# Patient Record
Sex: Male | Born: 1945
Health system: Southern US, Community
[De-identification: ages and names within clinical notes are randomized; demographics above are authoritative.]

## PROBLEM LIST (undated history)

## (undated) DIAGNOSIS — J449 Chronic obstructive pulmonary disease, unspecified: Secondary | ICD-10-CM

## (undated) DIAGNOSIS — K219 Gastro-esophageal reflux disease without esophagitis: Secondary | ICD-10-CM

## (undated) DIAGNOSIS — J302 Other seasonal allergic rhinitis: Secondary | ICD-10-CM

## (undated) DIAGNOSIS — E785 Hyperlipidemia, unspecified: Secondary | ICD-10-CM

## (undated) DIAGNOSIS — I10 Essential (primary) hypertension: Secondary | ICD-10-CM

## (undated) DIAGNOSIS — B029 Zoster without complications: Secondary | ICD-10-CM

## (undated) DIAGNOSIS — E079 Disorder of thyroid, unspecified: Secondary | ICD-10-CM

## (undated) HISTORY — DX: Zoster without complications: B02.9

## (undated) HISTORY — DX: Hyperlipidemia, unspecified: E78.5

## (undated) HISTORY — DX: Gastro-esophageal reflux disease without esophagitis: K21.9

## (undated) HISTORY — DX: Disorder of thyroid, unspecified: E07.9

## (undated) HISTORY — DX: Essential (primary) hypertension: I10

## (undated) HISTORY — PX: MULTIPLE TOOTH EXTRACTIONS: SHX2053

## (undated) HISTORY — DX: Chronic obstructive pulmonary disease, unspecified: J44.9

## (undated) HISTORY — PX: NO PAST SURGERIES: SHX2092

## (undated) HISTORY — DX: Other seasonal allergic rhinitis: J30.2

---

## 2000-10-08 ENCOUNTER — Ambulatory Visit (HOSPITAL_COMMUNITY): Admission: RE | Admit: 2000-10-08 | Discharge: 2000-10-08 | Payer: Self-pay | Admitting: Gastroenterology

## 2000-10-08 ENCOUNTER — Encounter (INDEPENDENT_AMBULATORY_CARE_PROVIDER_SITE_OTHER): Payer: Self-pay | Admitting: Specialist

## 2003-03-22 ENCOUNTER — Encounter (INDEPENDENT_AMBULATORY_CARE_PROVIDER_SITE_OTHER): Payer: Self-pay | Admitting: *Deleted

## 2003-03-22 ENCOUNTER — Encounter: Payer: Self-pay | Admitting: Internal Medicine

## 2004-12-13 ENCOUNTER — Ambulatory Visit: Payer: Self-pay | Admitting: Internal Medicine

## 2005-01-11 ENCOUNTER — Encounter: Payer: Self-pay | Admitting: Internal Medicine

## 2005-04-10 ENCOUNTER — Ambulatory Visit: Payer: Self-pay | Admitting: Family Medicine

## 2005-06-04 ENCOUNTER — Ambulatory Visit: Payer: Self-pay | Admitting: Internal Medicine

## 2006-01-14 ENCOUNTER — Ambulatory Visit: Payer: Self-pay | Admitting: Internal Medicine

## 2006-02-05 ENCOUNTER — Ambulatory Visit: Payer: Self-pay | Admitting: Internal Medicine

## 2006-02-05 ENCOUNTER — Encounter: Payer: Self-pay | Admitting: Internal Medicine

## 2006-05-22 ENCOUNTER — Ambulatory Visit: Payer: Self-pay | Admitting: Internal Medicine

## 2006-09-22 ENCOUNTER — Ambulatory Visit: Payer: Self-pay | Admitting: Internal Medicine

## 2006-11-12 ENCOUNTER — Ambulatory Visit: Payer: Self-pay | Admitting: Internal Medicine

## 2006-11-12 DIAGNOSIS — B029 Zoster without complications: Secondary | ICD-10-CM | POA: Insufficient documentation

## 2006-11-24 ENCOUNTER — Telehealth (INDEPENDENT_AMBULATORY_CARE_PROVIDER_SITE_OTHER): Payer: Self-pay | Admitting: *Deleted

## 2006-11-25 ENCOUNTER — Ambulatory Visit: Payer: Self-pay | Admitting: Internal Medicine

## 2007-03-23 ENCOUNTER — Encounter (INDEPENDENT_AMBULATORY_CARE_PROVIDER_SITE_OTHER): Payer: Self-pay | Admitting: *Deleted

## 2007-04-15 ENCOUNTER — Ambulatory Visit: Payer: Self-pay | Admitting: Internal Medicine

## 2007-04-15 DIAGNOSIS — J449 Chronic obstructive pulmonary disease, unspecified: Secondary | ICD-10-CM

## 2007-04-15 DIAGNOSIS — I10 Essential (primary) hypertension: Secondary | ICD-10-CM | POA: Insufficient documentation

## 2007-04-15 DIAGNOSIS — R498 Other voice and resonance disorders: Secondary | ICD-10-CM | POA: Insufficient documentation

## 2007-04-20 LAB — CONVERTED CEMR LAB
AST: 25 units/L (ref 0–37)
Basophils Absolute: 0 10*3/uL (ref 0.0–0.1)
Basophils Relative: 0 % (ref 0.0–1.0)
Bilirubin, Direct: 0.1 mg/dL (ref 0.0–0.3)
Calcium: 10 mg/dL (ref 8.4–10.5)
Chloride: 103 meq/L (ref 96–112)
Cholesterol: 259 mg/dL (ref 0–200)
GFR calc Af Amer: 97 mL/min
HDL: 47 mg/dL (ref >39.0)
Hemoglobin: 15 g/dL (ref 13.0–17.0)
Iron: 149 ug/dL (ref 42–165)
MCHC: 35.2 g/dL (ref 30.0–36.0)
MCV: 94 fL (ref 78.0–100.0)
Monocytes Absolute: 0.5 10*3/uL (ref 0.2–0.7)
Monocytes Relative: 7.1 % (ref 3.0–11.0)
PSA: 0.57 ng/mL (ref 0.10–4.00)
Potassium: 5 meq/L (ref 3.5–5.1)
TSH: 5.55 microintl units/mL — ABNORMAL HIGH (ref 0.35–5.50)
Total Bilirubin: 0.9 mg/dL (ref 0.3–1.2)
Total Protein: 7.7 g/dL (ref 6.0–8.3)
Triglycerides: 158 mg/dL — ABNORMAL HIGH (ref 0–149)
VLDL: 32 mg/dL (ref 0–40)

## 2007-10-06 ENCOUNTER — Telehealth (INDEPENDENT_AMBULATORY_CARE_PROVIDER_SITE_OTHER): Payer: Self-pay | Admitting: *Deleted

## 2008-03-23 ENCOUNTER — Ambulatory Visit: Payer: Self-pay | Admitting: Internal Medicine

## 2008-03-23 DIAGNOSIS — E785 Hyperlipidemia, unspecified: Secondary | ICD-10-CM

## 2008-08-01 ENCOUNTER — Ambulatory Visit: Payer: Self-pay | Admitting: Internal Medicine

## 2008-08-03 ENCOUNTER — Telehealth: Payer: Self-pay | Admitting: Internal Medicine

## 2008-08-03 LAB — CONVERTED CEMR LAB
ALT: 20 units/L (ref 0–53)
AST: 22 units/L (ref 0–37)
BUN: 13 mg/dL (ref 6–23)
Basophils Absolute: 0.1 10*3/uL (ref 0.0–0.1)
Basophils Relative: 1.3 % (ref 0.0–3.0)
CO2: 30 meq/L (ref 19–32)
Calcium: 9.3 mg/dL (ref 8.4–10.5)
Chloride: 101 meq/L (ref 96–112)
Cholesterol: 258 mg/dL — ABNORMAL HIGH (ref 0–200)
Creatinine, Ser: 0.8 mg/dL (ref 0.4–1.5)
Direct LDL: 162.2 mg/dL
Eosinophils Absolute: 0.3 10*3/uL (ref 0.0–0.7)
Eosinophils Relative: 4.8 % (ref 0.0–5.0)
GFR calc non Af Amer: 103.19 mL/min (ref >60)
Glucose, Bld: 82 mg/dL (ref 70–99)
HCT: 42.3 % (ref 39.0–52.0)
HDL: 46.3 mg/dL (ref >39.00)
Hemoglobin: 14.5 g/dL (ref 13.0–17.0)
Lymphocytes Relative: 32.7 % (ref 12.0–46.0)
Lymphs Abs: 2.1 10*3/uL (ref 0.7–4.0)
MCHC: 34.4 g/dL (ref 30.0–36.0)
MCV: 96.3 fL (ref 78.0–100.0)
Monocytes Absolute: 0.6 10*3/uL (ref 0.1–1.0)
Monocytes Relative: 8.7 % (ref 3.0–12.0)
Neutro Abs: 3.4 10*3/uL (ref 1.4–7.7)
Neutrophils Relative %: 52.5 % (ref 43.0–77.0)
PSA: 0.51 ng/mL (ref 0.10–4.00)
Platelets: 313 10*3/uL (ref 150.0–400.0)
Potassium: 4.3 meq/L (ref 3.5–5.1)
RBC: 4.39 M/uL (ref 4.22–5.81)
RDW: 12.2 % (ref 11.5–14.6)
Sodium: 140 meq/L (ref 135–145)
TSH: 3.7 microintl units/mL (ref 0.35–5.50)
Total CHOL/HDL Ratio: 6
Triglycerides: 208 mg/dL — ABNORMAL HIGH (ref 0.0–149.0)
VLDL: 41.6 mg/dL — ABNORMAL HIGH (ref 0.0–40.0)
WBC: 6.5 10*3/uL (ref 4.5–10.5)

## 2008-08-05 ENCOUNTER — Ambulatory Visit: Payer: Self-pay | Admitting: Internal Medicine

## 2008-10-21 ENCOUNTER — Telehealth: Payer: Self-pay | Admitting: Internal Medicine

## 2008-10-31 ENCOUNTER — Ambulatory Visit: Payer: Self-pay | Admitting: Gastroenterology

## 2008-11-02 ENCOUNTER — Ambulatory Visit: Payer: Self-pay | Admitting: Internal Medicine

## 2008-11-07 LAB — CONVERTED CEMR LAB
AST: 32 units/L (ref 0–37)
Cholesterol: 151 mg/dL (ref 0–200)
HDL: 45.7 mg/dL (ref >39.00)
Total CHOL/HDL Ratio: 3
Triglycerides: 117 mg/dL (ref 0.0–149.0)

## 2008-11-16 ENCOUNTER — Ambulatory Visit: Payer: Self-pay | Admitting: Gastroenterology

## 2008-11-16 HISTORY — PX: COLONOSCOPY: SHX174

## 2009-04-18 ENCOUNTER — Encounter (INDEPENDENT_AMBULATORY_CARE_PROVIDER_SITE_OTHER): Payer: Self-pay | Admitting: *Deleted

## 2009-05-24 ENCOUNTER — Telehealth (INDEPENDENT_AMBULATORY_CARE_PROVIDER_SITE_OTHER): Payer: Self-pay | Admitting: *Deleted

## 2009-07-16 ENCOUNTER — Emergency Department (HOSPITAL_COMMUNITY): Admission: EM | Admit: 2009-07-16 | Discharge: 2009-07-16 | Payer: Self-pay | Admitting: Emergency Medicine

## 2009-07-17 ENCOUNTER — Telehealth (INDEPENDENT_AMBULATORY_CARE_PROVIDER_SITE_OTHER): Payer: Self-pay | Admitting: *Deleted

## 2009-09-18 ENCOUNTER — Ambulatory Visit: Payer: Self-pay | Admitting: Internal Medicine

## 2009-10-17 ENCOUNTER — Telehealth (INDEPENDENT_AMBULATORY_CARE_PROVIDER_SITE_OTHER): Payer: Self-pay | Admitting: *Deleted

## 2009-11-15 ENCOUNTER — Ambulatory Visit: Payer: Self-pay | Admitting: Internal Medicine

## 2009-11-15 DIAGNOSIS — R209 Unspecified disturbances of skin sensation: Secondary | ICD-10-CM | POA: Insufficient documentation

## 2009-11-15 LAB — CONVERTED CEMR LAB
ALT: 25 units/L (ref 0–53)
AST: 23 units/L (ref 0–37)
Basophils Absolute: 0 10*3/uL (ref 0.0–0.1)
Basophils Relative: 0.4 % (ref 0.0–3.0)
Eosinophils Relative: 6 % — ABNORMAL HIGH (ref 0.0–5.0)
Folate: >20 ng/mL
Lymphocytes Relative: 34.8 % (ref 12.0–46.0)
Lymphs Abs: 1.7 10*3/uL (ref 0.7–4.0)
MCV: 95.2 fL (ref 78.0–100.0)
Monocytes Absolute: 0.4 10*3/uL (ref 0.1–1.0)
Monocytes Relative: 8.9 % (ref 3.0–12.0)
Neutro Abs: 2.5 10*3/uL (ref 1.4–7.7)
PSA: 0.01 ng/mL — ABNORMAL LOW (ref 0.10–4.00)
Platelets: 308 10*3/uL (ref 150.0–400.0)
Potassium: 4.7 meq/L (ref 3.5–5.1)
Sed Rate: 20 mm/hr (ref 0–22)
Sodium: 142 meq/L (ref 135–145)
Total CHOL/HDL Ratio: 3
Total Protein: 7.2 g/dL (ref 6.0–8.3)
WBC: 4.9 10*3/uL (ref 4.5–10.5)

## 2009-11-20 ENCOUNTER — Encounter: Payer: Self-pay | Admitting: Internal Medicine

## 2009-11-21 ENCOUNTER — Telehealth: Payer: Self-pay | Admitting: Internal Medicine

## 2009-12-21 ENCOUNTER — Telehealth: Payer: Self-pay | Admitting: Internal Medicine

## 2010-06-12 NOTE — Progress Notes (Signed)
  Phone Note Call from Patient   Caller: Spouse Summary of Call: [facility] that was sent in 04/18/09.  I will re-send rx.  Patient is due ROV.  Will [facility] @ [phone]. Initial call taken by: Shary Decamp,  May 24, 2009 9:58 AM    New/Updated Medications: SIMVASTATIN 40 MG TABS (SIMVASTATIN) 1 by mouth at bedtime - DUE OFFICE VISIT Prescriptions: SIMVASTATIN 40 MG TABS (SIMVASTATIN) 1 by mouth at bedtime - DUE OFFICE VISIT  #90 x 0   Entered by:   Shary Decamp   Authorized by:   Nolon Rod. Paz MD   Signed by:   Shary Decamp on 05/24/2009   Method used:   Printed then faxed to ...       [facility] Fax) (mail-order)             ,          Ph: [phone]       Fax: [phone]   RxID:   [id number] METOPROLOL SUCCINATE 50 MG XR24H-TAB (METOPROLOL SUCCINATE) 1 by mouth once daily -- DUE OFFICE VISIT  #90 x 0   Entered by:   Shary Decamp   Authorized by:   Nolon Rod. Paz MD   Signed by:   Shary Decamp on 05/24/2009   Method used:   Printed then faxed to ...       [facility] Fax) (mail-order)             ,          Ph: [phone]       Fax: [phone]   RxID:   [id number]

## 2010-06-12 NOTE — Procedures (Signed)
Summary: Colonoscopy/[facility]  Colonoscopy/[facility]   Imported By: Lanelle Bal 07/10/2009 12:56:30  _____________________________________________________________________  External Attachment:    Type:   Image     Comment:   External Document

## 2010-06-12 NOTE — Progress Notes (Signed)
  Phone Note Call from Patient Call back at [phone]   Caller: Spouse Summary of Call: Called and wanted to know last tetanus shot, was at ED ladt night due to a fall, cut head.   --Called pt informed last tetanus 08/01/08 .Kandice Hams  July 17, 2009 11:09 AM  Initial call taken by: Kandice Hams,  July 17, 2009 11:10 AM

## 2010-06-12 NOTE — Progress Notes (Signed)
Summary: lab results  Phone Note Outgoing Call   Summary of Call: advise patient: His cholesterol is very well controlled His PSA is good I checked a testosterone level, this is low. Plan to recheck when he comes back. I don't think he has any symptoms from it Good results, continue with same meds Amandine Covino E. Mckay Brandt MD  November 21, 2009 11:02 AM   Follow-up for Phone Call        left message for pt to call back. Army Fossa CMA  November 21, 2009 11:12 AM   Additional Follow-up for Phone Call Additional follow up Details #1::        Patient is aware of lab results. Additional Follow-up by: Harold Barban,  November 21, 2009 1:32 PM

## 2010-06-12 NOTE — Assessment & Plan Note (Signed)
Summary: FOR A SPIDER BITE--PH   Vital Signs:  Patient profile:   65 year old male Height:      69 inches Weight:      171 pounds BMI:     25.34 Temp:     97.6 degrees F oral BP sitting:   122 / 70  Vitals Entered By: Shary Decamp (Sep 18, 2009 2:24 PM) CC: ?spider bite to hands, red, raised bump, itchy, painful   History of Present Illness: patient worked in the yard two days ago, yesterday he had a couple of itchy spots in his hands today he is slightly swollen and tender  Current Medications (verified): 1)  Metoprolol Succinate 50 Mg Xr24h-Tab (Metoprolol Succinate) .Marland Kitchen.. 1 By Mouth Once Daily -- Due Office Visit 2)  Multivitamin 3)  Asa 81mg  .... 1 By Mouth Qd 4)  Calcium 5)  Fish Oil 6)  Garlic 7)  Simvastatin 40 Mg Tabs (Simvastatin) .Marland Kitchen.. 1 By Mouth At Bedtime - Due Office Visit  Allergies (verified): No Known Drug Allergies  Past History:  Past Medical History: Reviewed history from 03/23/2008 and no changes required. COPD, PFTs 01-2006 mild obstruction, (+) reponse to Bronchodilators Hypertension Hyperlipidemia  Past Surgical History: Reviewed history from 04/15/2007 and no changes required. no  Social History: Reviewed history from 04/15/2007 and no changes required. Married 3 kids works at [employer]  Review of Systems       denies fever no  discharge from the area of concern  Physical Exam  General:  alert and well-developed.   Extremities:  at the dorsal aspect of the hands L>R: has a area of redness, no warmness or fluctuancy, no blisters size aprox 2 cm    Impression & Recommendations:  Problem # 1:  ? of CONTACT DERMATITIS (ICD-692.9) contact dermatitis? doubt spider bite Plan Rx steroids, if not better he will let me know, antibiotics? His updated medication list for this problem includes:    Hydrocortisone 2.5 % Crea (Hydrocortisone) .Marland Kitchen... Apply two times a day x 1 week  Complete Medication List: 1)  Metoprolol Succinate 50 Mg  Xr24h-tab (Metoprolol succinate) .Marland Kitchen.. 1 by mouth once daily -- due office visit 2)  Multivitamin  3)  Asa 81mg   .... 1 by mouth qd 4)  Calcium  5)  Fish Oil  6)  Garlic  7)  Simvastatin 40 Mg Tabs (Simvastatin) .Marland Kitchen.. 1 by mouth at bedtime - due office visit 8)  Hydrocortisone 2.5 % Crea (Hydrocortisone) .... Apply two times a day x 1 week  Patient Instructions: 1)  schedule a yearly check up at your convinience  Prescriptions: HYDROCORTISONE 2.5 % CREA (HYDROCORTISONE) apply two times a day x 1 week  #1 x 0   Entered and Authorized by:   Nolon Rod. Paz MD   Signed by:   Nolon Rod. Paz MD on 09/18/2009   Method used:   Electronically to        [facility] [address] (retail)       [id number]       [place], [place]  [address]       Ph: [phone]       Fax: [phone]   RxID:   [id number]

## 2010-06-12 NOTE — Progress Notes (Signed)
Summary: refill--simvastatin  Phone Note Refill Request Message from:  Patient on December 21, 2009 4:25 PM  Refills Requested: Medication #1:  SIMVASTATIN 40 MG TABS 1 by mouth at bedtime - DUE OFFICE VISIT   Dosage confirmed as above?Dosage Confirmed   Supply Requested: 3 months Request refill be sent to [facility] Fax [phone]   Follow-up for Phone Call        Pt notified rx refilled. Nicki Guadalajara Fergerson CMA ([facility])  December 21, 2009 4:30 PM     Prescriptions: SIMVASTATIN 40 MG TABS (SIMVASTATIN) 1 by mouth at bedtime - DUE OFFICE VISIT  #90 x 1   Entered by:   Mervin Kung CMA ([facility])   Authorized by:   Nolon Rod. Paz MD   Signed by:   Mervin Kung CMA ([facility]) on 12/21/2009   Method used:   Faxed to ...       [facility] Delivery Fax) (mail-order)             ,          Ph: [phone]       Fax: [phone]   RxID:   [id number]

## 2010-06-12 NOTE — Progress Notes (Signed)
Summary: REFILL  Phone Note Refill Request Message from:  Patient on October 17, 2009 12:04 PM  Refills Requested: Medication #1:  METOPROLOL SUCCINATE 50 MG XR24H-TAB 1 by mouth once daily -- DUE OFFICE VISIT [facility]X TO [facility]- [phone]   Method Requested: Fax to Mail Away Pharmacy Next Appointment Scheduled: CPX SCHEDULED [id number] Initial call taken by: Okey Regal Spring,  October 17, 2009 12:06 PM  Follow-up for Phone Call        rx faxed to above #..............Marland KitchenFelecia Deloach CMA  October 17, 2009 2:03 PM     Prescriptions: METOPROLOL SUCCINATE 50 MG XR24H-TAB (METOPROLOL SUCCINATE) 1 by mouth once daily -- DUE OFFICE VISIT  #90 x 1   Entered by:   Jeremy Johann CMA   Authorized by:   Nolon Rod. Paz MD   Signed by:   Jeremy Johann CMA on 10/17/2009   Method used:   Printed then faxed to ...       [facility] Delivery Fax) (mail-order)             ,          Ph: [phone]       Fax: [phone]   RxID:   [id number]

## 2010-06-12 NOTE — Assessment & Plan Note (Signed)
Summary: CPX/FASTING//KN   Vital Signs:  Patient profile:   65 year old male Weight:      172.50 pounds Temp:     97.1 degrees F oral Pulse rate:   59 / minute Pulse rhythm:   regular BP sitting:   122 / 72  (left arm) Cuff size:   large  Vitals Entered By: Army Fossa CMA (November 15, 2009 10:05 AM) CC: CPX: fasting Comments pt states he is having burning pains in his thighs.    History of Present Illness: CPX also , 1 year h/o burning on off day or  night in the proximal LE (back and front),  like burning, last seconds . symptoms happen every 3 months  no back pain no rash no numbness in the legs   Preventive Screening-Counseling & Management  Alcohol-Tobacco     Alcohol drinks/day: 3     Alcohol type: beer  Caffeine-Diet-Exercise     Does Patient Exercise: yes     Type of exercise: walking     Times/week: 7  Allergies (verified): No Known Drug Allergies  Past History:  Past Medical History: COPD, PFTs 01-2006 mild obstruction, (+) reponse to Bronchodilators Hypertension Hyperlipidemia shingles, thorax, 2008  Past Surgical History: Reviewed history from 04/15/2007 and no changes required. no  Family History: Reviewed history from 04/15/2007 and no changes required. colon ca--no prostate ca--no DM--no MI-- F?  HTN-- mother, sister  Social History: Married 3 kids works at [employer] tobacco-- quit 2007 aprox  diet-- low fat exercise-- active , no roiexercise Does Patient Exercise:  yes  Review of Systems General:  Denies fatigue, fever, and weight loss. CV:  Denies chest pain or discomfort, palpitations, and shortness of breath with exertion. Resp:  Denies cough, coughing up blood, and wheezing; occasionally PN drip . GI:  Denies bloody stools, nausea, and vomiting. GU:  Denies hematuria, urinary frequency, and urinary hesitancy.  Physical Exam  General:  alert, well-developed, and well-nourished.   Neck:  no masses, no thyromegaly, and normal  carotid upstroke.   Lungs:  normal respiratory effort, no intercostal retractions, and no accessory muscle use.  BS slightly  decreased, otherwise clear Heart:  normal rate, regular rhythm, and no murmur.   Abdomen:  soft, non-tender, and no distention.   Rectal:  No external abnormalities noted. Normal sphincter tone. No rectal masses or tenderness. stools hem neg  Prostate:  Prostate gland firm and smooth, no enlargement, nodularity, tenderness, mass, asymmetry or induration. Extremities:  no lower  ext. edema Neurologic:  alert & oriented X3, strength normal in all extremities, sensation intact to light touch, sensation intact to pinprick, gait normal, and DTRs symmetrical and normal.   Psych:  Cognition and judgment appear intact. Alert and cooperative with normal attention span and concentration.  not anxious appearing and not depressed appearing.     Impression & Recommendations:  Problem # 1:  HEALTH SCREENING (ICD-V70.0) Td 2000 and  3- 2010 pneumonia shot 12-08  had shingles aprox 2008  Cscope x 2 : last 2004 (Dr Doreatha Martin)  coloscopy 11-2008 negative, next in 10 years  discussed diet  and exercise Labs     Orders: Venipuncture (45409) TLB-BMP (Basic Metabolic Panel-BMET) (80048-METABOL) TLB-CBC Platelet - w/Differential (85025-CBCD) TLB-Hepatic/Liver Function Pnl (80076-HEPATIC) TLB-Lipid Panel (80061-LIPID) TLB-TSH (Thyroid Stimulating Hormone) (84443-TSH) TLB-PSA (Prostate Specific Antigen) (84153-PSA)  Problem # 2:  PARESTHESIA (ICD-782.0) labs Nerve conduction study?  declined we agreed to wait and see , if symptoms increase will fo further w/u  Orders: TLB-B12 + Folate Pnl (82746_82607-B12/FOL) TLB-Sedimentation Rate (ESR) (85652-ESR) T-RPR (Syphilis) ([address]-60454)  Problem # 3:  HYPERTENSION (ICD-401.9) at goal  His updated medication list for this problem includes:    Metoprolol Succinate 50 Mg Xr24h-tab (Metoprolol succinate) .Marland Kitchen... 1 by mouth once daily --  due office visit  BP today: 122/72 Prior BP: 122/70 (09/18/2009)  Labs Reviewed: K+: 4.3 (08/01/2008) Creat: : 0.8 (08/01/2008)   Chol: 151 (11/02/2008)   HDL: 45.70 (11/02/2008)   LDL: 82 (11/02/2008)   TG: 117.0 (11/02/2008)  Problem # 4:  HYPERLIPIDEMIA (ICD-272.4) labs His updated medication list for this problem includes:    Simvastatin 40 Mg Tabs (Simvastatin) .Marland Kitchen... 1 by mouth at bedtime - due office visit  Labs Reviewed: SGOT: 32 (11/02/2008)   SGPT: 31 (11/02/2008)   HDL:45.70 (11/02/2008), 46.30 (08/01/2008)  LDL:82 (11/02/2008), DEL (09/81/1914)  Chol:151 (11/02/2008), 258 (08/01/2008)  Trig:117.0 (11/02/2008), 208.0 (08/01/2008)  Problem # 5:  COPD (ICD-496) asx  Complete Medication List: 1)  Simvastatin 40 Mg Tabs (Simvastatin) .Marland Kitchen.. 1 by mouth at bedtime - due office visit 2)  Metoprolol Succinate 50 Mg Xr24h-tab (Metoprolol succinate) .Marland Kitchen.. 1 by mouth once daily -- due office visit 3)  Multivitamin  4)  Asa 81mg   .... 1 by mouth qd 5)  Calcium  6)  Fish Oil  7)  Garlic   Patient Instructions: 1)  Please schedule a follow-up appointment in 6 months .    Risk Factors:  Alcohol use:  yes    Type:  beer    Drinks per day:  3 Exercise:  yes    Times per week:  7    Type:  walking

## 2010-07-02 ENCOUNTER — Telehealth: Payer: Self-pay | Admitting: Internal Medicine

## 2010-07-05 ENCOUNTER — Telehealth: Payer: Self-pay | Admitting: Internal Medicine

## 2010-07-10 NOTE — Progress Notes (Signed)
Summary: Viagra request  Phone Note Call from Patient Call back at Home Phone [phone]   Summary of Call: Patient left message on triage requesting prescription for Viagra. He notes that he has taken this before but it has been awhile. I do not see that this has ever been on med list. Please advise. Lucious Groves CMA  July 02, 2010 10:59 AM   Follow-up for Phone Call        ok viagra 100mg  1/2 to 1 by mouth once daily as needed #10, no Rf d/w patient the fact that he can not be Rx nitroglycerin (a type of heart medicine ) if he takes viagra Follow-up by: Ceilidh Torregrossa E. Franky Reier MD,  July 02, 2010 4:45 PM  Additional Follow-up for Phone Call Additional follow up Details #1::        Patient notified. Additional Follow-up by: Lucious Groves CMA,  July 02, 2010 4:49 PM    New/Updated Medications: VIAGRA 100 MG TABS (SILDENAFIL CITRATE) 0.5-1 tabs by mouth daily as needed Prescriptions: VIAGRA 100 MG TABS (SILDENAFIL CITRATE) 0.5-1 tabs by mouth daily as needed  #10 x 0   Entered by:   Lucious Groves CMA   Authorized by:   Nolon Rod. Yanin Muhlestein MD   Signed by:   Lucious Groves CMA on 07/02/2010   Method used:   Faxed to ...       [facility] [address] (retail)       [phone]       [place], [place]  [address]       Ph: [phone]       Fax: [phone]   RxID:   [id number]

## 2010-07-10 NOTE — Progress Notes (Signed)
Summary: Med refill  Phone Note Refill Request Call back at [phone]  xt [phone] Message from:  Spouse on July 05, 2010 11:37 AM  Refills Requested: Medication #1:  METOPROLOL SUCCINATE 50 MG XR24H-TAB 1 by mouth once daily -- DUE OFFICE VISIT Patient spouse left message on triage that the patient needs the above refill sent to [facility]. ID#  [id number].  Patient spouse notified.  Initial call taken by: Lucious Groves CMA,  July 05, 2010 11:38 AM    New/Updated Medications: METOPROLOL SUCCINATE 50 MG XR24H-TAB (METOPROLOL SUCCINATE) 1 by mouth once daily Prescriptions: METOPROLOL SUCCINATE 50 MG XR24H-TAB (METOPROLOL SUCCINATE) 1 by mouth once daily  #90 x 1   Entered by:   Lucious Groves CMA   Authorized by:   Nolon Rod. Lysha Schrade MD   Signed by:   Lucious Groves CMA on 07/05/2010   Method used:   Faxed to ...       [facility] Delivery Fax) (mail-order)             ,          Ph: [phone]       Fax: [phone]   RxID:   [id number]

## 2010-08-29 ENCOUNTER — Other Ambulatory Visit: Payer: Self-pay | Admitting: *Deleted

## 2010-08-29 MED ORDER — SIMVASTATIN 40 MG PO TABS: 40.0000 mg | ORAL_TABLET | Freq: Every day | ORAL | Status: DC

## 2011-01-16 ENCOUNTER — Other Ambulatory Visit: Payer: Self-pay | Admitting: Internal Medicine

## 2011-01-16 ENCOUNTER — Other Ambulatory Visit: Payer: Self-pay | Admitting: *Deleted

## 2011-01-16 MED ORDER — SIMVASTATIN 40 MG PO TABS: 40.0000 mg | ORAL_TABLET | Freq: Every day | ORAL | Status: DC

## 2011-01-16 NOTE — Telephone Encounter (Signed)
Rx Done . 

## 2011-01-24 ENCOUNTER — Encounter: Payer: Self-pay | Admitting: Internal Medicine

## 2011-01-24 ENCOUNTER — Ambulatory Visit (INDEPENDENT_AMBULATORY_CARE_PROVIDER_SITE_OTHER): Payer: BC Managed Care – PPO | Admitting: Internal Medicine

## 2011-01-24 DIAGNOSIS — Z0000 Encounter for general adult medical examination without abnormal findings: Secondary | ICD-10-CM | POA: Insufficient documentation

## 2011-01-24 DIAGNOSIS — I10 Essential (primary) hypertension: Secondary | ICD-10-CM

## 2011-01-24 DIAGNOSIS — J449 Chronic obstructive pulmonary disease, unspecified: Secondary | ICD-10-CM

## 2011-01-24 DIAGNOSIS — E785 Hyperlipidemia, unspecified: Secondary | ICD-10-CM

## 2011-01-24 DIAGNOSIS — Z125 Encounter for screening for malignant neoplasm of prostate: Secondary | ICD-10-CM

## 2011-01-24 DIAGNOSIS — E291 Testicular hypofunction: Secondary | ICD-10-CM | POA: Insufficient documentation

## 2011-01-24 LAB — CBC WITH DIFFERENTIAL/PLATELET
Basophils Relative: 0.6 % (ref 0.0–3.0)
Eosinophils Absolute: 0.4 10*3/uL (ref 0.0–0.7)
Eosinophils Relative: 5.4 % — ABNORMAL HIGH (ref 0.0–5.0)
MCHC: 33.9 g/dL (ref 30.0–36.0)
Platelets: 316 10*3/uL (ref 150.0–400.0)
RBC: 4.54 Mil/uL (ref 4.22–5.81)

## 2011-01-24 LAB — BASIC METABOLIC PANEL
BUN: 18 mg/dL (ref 6–23)
GFR: 74.01 mL/min (ref >60.00)
Glucose, Bld: 93 mg/dL (ref 70–99)
Potassium: 4.8 mEq/L (ref 3.5–5.1)

## 2011-01-24 LAB — ALT: ALT: 22 U/L (ref 0–53)

## 2011-01-24 LAB — LIPID PANEL
Cholesterol: 165 mg/dL (ref 0–200)
HDL: 43.5 mg/dL (ref >39.00)
Triglycerides: 159 mg/dL — ABNORMAL HIGH (ref 0.0–149.0)

## 2011-01-24 LAB — TSH: TSH: 5.47 u[IU]/mL (ref 0.35–5.50)

## 2011-01-24 MED ORDER — TIOTROPIUM BROMIDE MONOHYDRATE 18 MCG IN CAPS: 18.0000 ug | ORAL_CAPSULE | Freq: Every day | RESPIRATORY_TRACT | Status: DC

## 2011-01-24 NOTE — Assessment & Plan Note (Addendum)
Td 2000 and  3- 2010 pneumonia shot 12-08  had shingles aprox 2008 Recommend a flu shot this season Cscope x 2 : last 2004 (Dr Doreatha Martin)  coloscopy 11-2008 negative, next in 10 years  discussed diet  and exercise Former smoker, recommend a dental checkup at least every 6 months

## 2011-01-24 NOTE — Progress Notes (Signed)
  Subjective:    Patient ID: Kevin [name], male    DOB: 1945-08-17, 65 y.o.   MRN: [id number]  HPI Here for Medicare AWV: 1. Risk factors based on Past M, S, F history: reviewed 2. Physical Activities:  Very active at work, no routine exercise  3. Depression/mood: No problemss noted or reported  4. Hearing:  No problems noted or reported 5. ADL's: independent  6. Fall Risk: no recent problems  7. home Safety: does feel safe at home  8. Height, weight, &visual acuity: see VS, just got new glasses  9. Counseling: provided 10. Labs ordered based on risk factors: if needed  11. Referral Coordination: if needed 12.  Care Plan, see assessment and plan  13.   Cognitive Assessment: Cognition and motor exam  appropriate for age  today we discussed the following: High cholesterol, good medication compliance. Hypertension, good medication compliance. BP today is normal. COPD, he has chronic cough, mostly in the mornings, sometimes produces a mild amount of light green sputum. Denies any dyspnea on exertion or hemoptysis. Hypogonadism, his testosterone was found to be low last year, he admits today to some decreased libido for 2 years. Denies any fatigue or lack of energy.  Past Medical History: COPD, PFTs 01-2006 mild obstruction, (+) reponse to Bronchodilators Hypertension Hyperlipidemia shingles, thorax, 2008  Past Surgical History: no  Family History: colon ca--no prostate ca--no DM--no MI-- F ~ 40 y/o HTN-- mother, sister  Social History: Married 3 kids works at [employer] tobacco-- quit 2007 aprox, used to smoke 2 ppd  diet-- low fat, trying to eat healthy  exercise-- active at work  Review of Systems  Constitutional: Negative for fever and fatigue.  Cardiovascular: Negative for chest pain and leg swelling.  Gastrointestinal: Negative for abdominal pain and blood in stool.  Genitourinary: Negative for dysuria, hematuria and difficulty urinating.       Occ hesitancy      Objective:   Physical Exam  Constitutional: He is oriented to person, place, and time. He appears well-developed and well-nourished. No distress.  HENT:  Head: Normocephalic and atraumatic.  Neck: No thyromegaly present.  Cardiovascular: Normal rate, regular rhythm and normal heart sounds.   No murmur heard. Pulmonary/Chest: Effort normal. No respiratory distress.       Some cough noted today, he has end expiratory wheezing, no crackles or rhonchi  Abdominal: Soft. Bowel sounds are normal. He exhibits no distension. There is no tenderness. There is no rebound and no guarding.  Genitourinary:       Declined  to have a rectal exam  Neurological: He is alert and oriented to person, place, and time.  Skin: He is not diaphoretic.  Psychiatric: He has a normal mood and affect. His behavior is normal. Judgment and thought content normal.          Assessment & Plan:

## 2011-01-24 NOTE — Assessment & Plan Note (Signed)
Good compliance, labs  

## 2011-01-24 NOTE — Assessment & Plan Note (Signed)
Seems well controlled. At some point we may consider discontinue  Toprol due to  COPD.

## 2011-01-24 NOTE — Assessment & Plan Note (Signed)
Had PFTs in 2007, showed mild to moderate obstruction, he had a positive response to albuterol. He was prescribed spiriva but did not try, he wasn't symptomatic then   Last chest x-ray 07/2008. Plan: Chest x-ray, repeat PFTs, spiriva

## 2011-01-24 NOTE — Patient Instructions (Signed)
Start spiriva  come back in 3 months to see if it's working. See your dentist every 6 months Please get your chest x-ray Will order pulmonary tests.

## 2011-01-24 NOTE — Assessment & Plan Note (Signed)
Last year, his testosterone was decreased, he is essentially asymptomatic except for a mild decrease in libido. Recheck

## 2011-01-25 LAB — TESTOSTERONE, FREE, TOTAL, SHBG
Testosterone-% Free: 2 % (ref 1.6–2.9)
Testosterone: 298.8 ng/dL (ref 250–890)

## 2011-01-30 ENCOUNTER — Other Ambulatory Visit: Payer: Self-pay | Admitting: Internal Medicine

## 2011-01-30 DIAGNOSIS — J449 Chronic obstructive pulmonary disease, unspecified: Secondary | ICD-10-CM

## 2011-01-31 ENCOUNTER — Other Ambulatory Visit: Payer: Self-pay | Admitting: Internal Medicine

## 2011-01-31 MED ORDER — METOPROLOL SUCCINATE ER 50 MG PO TB24: 50.0000 mg | ORAL_TABLET | Freq: Every day | ORAL | Status: DC

## 2011-01-31 NOTE — Telephone Encounter (Signed)
[facility]...01/31/11@8 :52am/[facility]

## 2011-02-19 ENCOUNTER — Ambulatory Visit (INDEPENDENT_AMBULATORY_CARE_PROVIDER_SITE_OTHER)
Admission: RE | Admit: 2011-02-19 | Discharge: 2011-02-19 | Disposition: A | Discharge status: Home / Self Care | Payer: BC Managed Care – PPO | Source: Ambulatory Visit | Attending: Internal Medicine | Admitting: Internal Medicine

## 2011-02-19 ENCOUNTER — Telehealth: Payer: Self-pay | Admitting: Internal Medicine

## 2011-02-19 DIAGNOSIS — J449 Chronic obstructive pulmonary disease, unspecified: Secondary | ICD-10-CM

## 2011-02-19 NOTE — Telephone Encounter (Signed)
Advise patient, x-ray did show COPD and we need to repeat the x-ray to rule out a nodule. Order entered, please talk w/ technician at [facility]: needs nipple markers

## 2011-02-21 NOTE — Telephone Encounter (Signed)
Patient informed. [facility] informed.

## 2011-02-26 ENCOUNTER — Ambulatory Visit (INDEPENDENT_AMBULATORY_CARE_PROVIDER_SITE_OTHER)
Admission: RE | Admit: 2011-02-26 | Discharge: 2011-02-26 | Disposition: A | Discharge status: Home / Self Care | Payer: BC Managed Care – PPO | Source: Ambulatory Visit | Attending: Internal Medicine | Admitting: Internal Medicine

## 2011-02-26 DIAGNOSIS — J449 Chronic obstructive pulmonary disease, unspecified: Secondary | ICD-10-CM

## 2011-03-04 ENCOUNTER — Telehealth: Payer: Self-pay | Admitting: Internal Medicine

## 2011-03-04 NOTE — Telephone Encounter (Signed)
Patient had chest xray & was told to repeat - he would like result of 2nd chest xray

## 2011-03-06 NOTE — Telephone Encounter (Signed)
Pt picked up results, done

## 2011-03-27 ENCOUNTER — Ambulatory Visit (INDEPENDENT_AMBULATORY_CARE_PROVIDER_SITE_OTHER): Payer: BC Managed Care – PPO | Admitting: Internal Medicine

## 2011-03-27 ENCOUNTER — Encounter: Payer: Self-pay | Admitting: Internal Medicine

## 2011-03-27 VITALS — BP 128/70 | HR 69 | Temp 98.4°F | Ht 71.0 in | Wt 178.4 lb

## 2011-03-27 DIAGNOSIS — J449 Chronic obstructive pulmonary disease, unspecified: Secondary | ICD-10-CM

## 2011-03-27 MED ORDER — AZITHROMYCIN 250 MG PO TABS: ORAL_TABLET | ORAL | Status: AC

## 2011-03-27 MED ORDER — AZITHROMYCIN 250 MG PO TABS: ORAL_TABLET | ORAL | Status: DC

## 2011-03-27 NOTE — Assessment & Plan Note (Addendum)
History of COPD, usually asymptomatic He presents today with bronchitis, seen instructions. If no better or if he starts wheezing, will add albuterol

## 2011-03-27 NOTE — Progress Notes (Signed)
  Subjective:    Patient ID: Kevin [name], male    DOB: 1945-12-22, 65 y.o.   MRN: [id number]  HPI Symptoms started a week ago, cough, green sputum, nose congestion. Has been taking Mucinex DM. Wife had similar symptoms, she is already better after she took antibiotics.   Past Medical History:  COPD, PFTs 01-2006 mild obstruction, (+) reponse to Bronchodilators  Hypertension  Hyperlipidemia  shingles, thorax, 2008   Past Surgical History:  no   Review of Systems No fever or chills, feeling slightly weak, appetite normal. No chest pain or shortness of breath except when coughing No hemoptysis No actual wheezing at this time.    Objective:   Physical Exam  Constitutional: He appears well-developed and well-nourished. No distress.  HENT:  Nose: Nose normal.  Mouth/Throat: No oropharyngeal exudate.       Ears with some wax, no discharge  Cardiovascular: Normal rate, regular rhythm and normal heart sounds.   Pulmonary/Chest:       Few rhonchi, no actual wheezing or crackles. No respiratory distress  Skin: He is not diaphoretic.          Assessment & Plan:

## 2011-03-27 NOTE — Patient Instructions (Signed)
Rest, fluids , tylenol For cough, take Mucinex DM twice a day as needed  Take the antibiotic as prescribed ----> zithromax Call if no better in few days or you start wheezing  Call anytime if the symptoms are severe

## 2011-04-02 ENCOUNTER — Ambulatory Visit (INDEPENDENT_AMBULATORY_CARE_PROVIDER_SITE_OTHER): Payer: BC Managed Care – PPO | Admitting: Internal Medicine

## 2011-04-02 DIAGNOSIS — J449 Chronic obstructive pulmonary disease, unspecified: Secondary | ICD-10-CM

## 2011-04-02 LAB — PULMONARY FUNCTION TEST

## 2011-04-02 NOTE — Progress Notes (Signed)
PFT done today. 

## 2011-04-15 ENCOUNTER — Encounter: Payer: Self-pay | Admitting: Internal Medicine

## 2011-04-16 ENCOUNTER — Telehealth: Payer: Self-pay | Admitting: Internal Medicine

## 2011-04-16 NOTE — Telephone Encounter (Signed)
Advise patient, PFTs done last month showed  mild COPD. As long as he does not have chronic cough or sx , he does not need meds. He does need a flu shot this season, if not done already please bring him to the office for a shot

## 2011-04-17 NOTE — Telephone Encounter (Signed)
Pt aware and declines flu shot. He states that he got a flu shot about 15 years ago and got sick so he hasn't taken one since

## 2011-10-22 ENCOUNTER — Other Ambulatory Visit: Payer: Self-pay | Admitting: Internal Medicine

## 2011-10-22 MED ORDER — METOPROLOL SUCCINATE ER 50 MG PO TB24: 50.0000 mg | ORAL_TABLET | Freq: Every day | ORAL | Status: DC

## 2011-10-22 NOTE — Telephone Encounter (Signed)
Refill Toprol XL 50, per not from faxed request from [facility] Patient was getting mail order & needs refills [facility] to [id number]  Last wrt 9.20.12, qty 90 wt/3-refills, take one tablet by mouth daily   Last OV 11.14.12

## 2011-10-22 NOTE — Telephone Encounter (Signed)
Refill done.  

## 2011-11-20 ENCOUNTER — Other Ambulatory Visit: Payer: Self-pay | Admitting: Internal Medicine

## 2011-11-21 NOTE — Telephone Encounter (Signed)
Refill done.  

## 2012-04-06 ENCOUNTER — Encounter: Payer: Self-pay | Admitting: Internal Medicine

## 2012-04-06 ENCOUNTER — Ambulatory Visit (INDEPENDENT_AMBULATORY_CARE_PROVIDER_SITE_OTHER): Payer: BC Managed Care – PPO | Admitting: Internal Medicine

## 2012-04-06 VITALS — BP 144/84 | HR 54 | Temp 97.8°F | Ht 70.25 in | Wt 178.0 lb

## 2012-04-06 DIAGNOSIS — I10 Essential (primary) hypertension: Secondary | ICD-10-CM

## 2012-04-06 DIAGNOSIS — J4489 Other specified chronic obstructive pulmonary disease: Secondary | ICD-10-CM

## 2012-04-06 DIAGNOSIS — J449 Chronic obstructive pulmonary disease, unspecified: Secondary | ICD-10-CM

## 2012-04-06 DIAGNOSIS — Z0000 Encounter for general adult medical examination without abnormal findings: Secondary | ICD-10-CM

## 2012-04-06 DIAGNOSIS — E291 Testicular hypofunction: Secondary | ICD-10-CM

## 2012-04-06 LAB — COMPREHENSIVE METABOLIC PANEL
AST: 22 U/L (ref 0–37)
BUN: 12 mg/dL (ref 6–23)
CO2: 30 mEq/L (ref 19–32)
Calcium: 9 mg/dL (ref 8.4–10.5)
Potassium: 4.4 mEq/L (ref 3.5–5.1)
Total Bilirubin: 0.5 mg/dL (ref 0.3–1.2)

## 2012-04-06 LAB — CBC WITH DIFFERENTIAL/PLATELET
Basophils Relative: 0.5 % (ref 0.0–3.0)
Eosinophils Relative: 6.8 % — ABNORMAL HIGH (ref 0.0–5.0)
HCT: 44.4 % (ref 39.0–52.0)
Hemoglobin: 14.7 g/dL (ref 13.0–17.0)
Lymphocytes Relative: 25.6 % (ref 12.0–46.0)
Monocytes Absolute: 0.5 10*3/uL (ref 0.1–1.0)
Neutro Abs: 3.4 10*3/uL (ref 1.4–7.7)
Neutrophils Relative %: 58.4 % (ref 43.0–77.0)
Platelets: 297 10*3/uL (ref 150.0–400.0)

## 2012-04-06 LAB — LIPID PANEL: HDL: 52.5 mg/dL (ref >39.00)

## 2012-04-06 MED ORDER — ZOSTER VACCINE LIVE 19400 UNT/0.65ML ~~LOC~~ SOLR: 0.6500 mL | Freq: Once | SUBCUTANEOUS | Status: DC

## 2012-04-06 NOTE — Progress Notes (Signed)
  Subjective:    Patient ID: Kevin [name], male    DOB: 1945-09-09, 66 y.o.   MRN: [id number]  HPI CPX  Past Medical History:   COPD, PFTs 01-2006 mild obstruction, (+) reponse to Bronchodilators   Hypertension   Hyperlipidemia   shingles, thorax, 2008    Past Surgical History:   no Family History: colon ca--no prostate ca--no DM--no MI-- F ~ 15 y/o HTN-- mother, sister  Social History: Married, 3 kids works at [employer] tobacco-- quit 2007 aprox, used to smoke 2 ppd   diet-- low fat, trying to eat healthy   exercise-- active at work, no routine exercise    Review of Systems 2 weeks history of foreign body feeling in the right ear, slightly decreased hearing. No pain or discharge. Good medication compliance, ambulatory BPs in the 120s. No chest or shortness of breath. He does have some cough most mornings, occasional green sputum, no hemoptysis. No nausea, vomiting, diarrhea or blood in the stools. No difficulty urinating or blood in the urine.     Objective:   Physical Exam General -- alert, well-developed, and well-nourished.   Neck --no thyromegaly , normal carotid pulse, no LADs HEENT -- left TM normal, abundant hard wax on the right. Lungs -- normal respiratory effort, no intercostal retractions, no accessory muscle use, and decreased breath sounds.   Heart-- normal rate, regular rhythm, no murmur, and no gallop.   Abdomen--soft, non-tender, no distention, no masses, no HSM, no guarding, and no rigidity.   Extremities-- no pretibial edema bilaterally Rectal-- No external abnormalities noted. Normal sphincter tone. No rectal masses or tenderness. Brown stool  Prostate:  Prostate gland firm and smooth, no enlargement, nodularity, tenderness, mass, asymmetry or induration. Neurologic-- alert & oriented X3 and strength normal in all extremities. Psych-- Cognition and judgment appear intact. Alert and cooperative with normal attention span and concentration.  not anxious  appearing and not depressed appearing.       Assessment & Plan:  Cerumen impaction, right side: S/p lavage, it was very hard to wax, unable to remove all. He did have some bleeding from the external canal. Advise him to use peroxide twice a day and come back in 10 days if not completely well

## 2012-04-06 NOTE — Assessment & Plan Note (Signed)
Well-controlled, no change 

## 2012-04-06 NOTE — Assessment & Plan Note (Addendum)
Td 2000 and  3- 2010 pneumonia shot 12-08  Declined flu shot, got sick w/ one 15 years ago, benefits explained Had shingles aprox 2008-- zostavax Rx provided  Cscope x 2  (Dr Doreatha Martin) , last coloscopy 11-2008 negative, next in 10 years DRE normal today, PSAs have been consistently normal, no PSA this year. discussed diet  and exercise

## 2012-04-06 NOTE — Assessment & Plan Note (Signed)
Testosterone level 2012 went back to normal

## 2012-04-06 NOTE — Assessment & Plan Note (Addendum)
Has morning cough, denies dyspnea on exertion, he is active at work. Last chest x-ray 10- 2012  We talk about possibly Spiriva, states he is doing okay, symptoms are mild, not interested.

## 2012-04-10 ENCOUNTER — Encounter: Payer: Self-pay | Admitting: *Deleted

## 2012-04-21 ENCOUNTER — Ambulatory Visit (INDEPENDENT_AMBULATORY_CARE_PROVIDER_SITE_OTHER): Payer: BC Managed Care – PPO | Admitting: Internal Medicine

## 2012-04-21 VITALS — BP 132/80 | HR 67 | Temp 98.1°F | Wt 181.0 lb

## 2012-04-21 DIAGNOSIS — H6120 Impacted cerumen, unspecified ear: Secondary | ICD-10-CM

## 2012-04-21 NOTE — Progress Notes (Signed)
  Subjective:    Patient ID: Kevin [name], male    DOB: January 06, 1946, 66 y.o.   MRN: [id number]  HPI Here for a ear lavage   Review of Systems     Objective:   Physical Exam  General -- alert, well-developed, and well-nourished.   L ear: normal R ear: wax        Assessment & Plan:  Cerumen impaction, a large amount of  wax was removed from the right year, patient tolerated it well, after the lavage tympanic membrane was normal. Recommend peroxide from time to time to keep ear  clean.

## 2012-05-09 ENCOUNTER — Other Ambulatory Visit: Payer: Self-pay | Admitting: Internal Medicine

## 2012-05-11 NOTE — Telephone Encounter (Signed)
Refill done.  

## 2012-05-22 ENCOUNTER — Ambulatory Visit (INDEPENDENT_AMBULATORY_CARE_PROVIDER_SITE_OTHER): Payer: BC Managed Care – PPO

## 2012-05-22 DIAGNOSIS — Z23 Encounter for immunization: Secondary | ICD-10-CM

## 2012-06-21 ENCOUNTER — Other Ambulatory Visit: Payer: Self-pay | Admitting: Internal Medicine

## 2012-06-22 NOTE — Telephone Encounter (Signed)
Refill done.  

## 2013-02-15 ENCOUNTER — Other Ambulatory Visit: Payer: Self-pay | Admitting: Internal Medicine

## 2013-02-15 NOTE — Telephone Encounter (Signed)
metoprolol refill sent to pharmacy # 30, 0 refills. Pt needs OV

## 2013-04-12 ENCOUNTER — Other Ambulatory Visit: Payer: Self-pay | Admitting: Internal Medicine

## 2013-04-12 DIAGNOSIS — I10 Essential (primary) hypertension: Secondary | ICD-10-CM

## 2013-04-12 NOTE — Telephone Encounter (Signed)
Refill for #[facility] [place], pt must keep upcoming appt for further refills.

## 2013-04-20 ENCOUNTER — Encounter: Payer: Self-pay | Admitting: Internal Medicine

## 2013-04-20 ENCOUNTER — Ambulatory Visit (INDEPENDENT_AMBULATORY_CARE_PROVIDER_SITE_OTHER): Payer: Medicare Other | Admitting: Internal Medicine

## 2013-04-20 VITALS — BP 155/82 | HR 59 | Temp 98.0°F | Ht 69.8 in | Wt 189.0 lb

## 2013-04-20 DIAGNOSIS — J4489 Other specified chronic obstructive pulmonary disease: Secondary | ICD-10-CM

## 2013-04-20 DIAGNOSIS — R0602 Shortness of breath: Secondary | ICD-10-CM

## 2013-04-20 DIAGNOSIS — J449 Chronic obstructive pulmonary disease, unspecified: Secondary | ICD-10-CM

## 2013-04-20 DIAGNOSIS — Z23 Encounter for immunization: Secondary | ICD-10-CM

## 2013-04-20 DIAGNOSIS — Z125 Encounter for screening for malignant neoplasm of prostate: Secondary | ICD-10-CM

## 2013-04-20 DIAGNOSIS — Z0000 Encounter for general adult medical examination without abnormal findings: Secondary | ICD-10-CM

## 2013-04-20 DIAGNOSIS — E785 Hyperlipidemia, unspecified: Secondary | ICD-10-CM

## 2013-04-20 DIAGNOSIS — I10 Essential (primary) hypertension: Secondary | ICD-10-CM

## 2013-04-20 LAB — CBC WITH DIFFERENTIAL/PLATELET
Basophils Absolute: 0 10*3/uL (ref 0.0–0.1)
Eosinophils Relative: 8.7 % — ABNORMAL HIGH (ref 0.0–5.0)
HCT: 44.1 % (ref 39.0–52.0)
Hemoglobin: 15.1 g/dL (ref 13.0–17.0)
Lymphocytes Relative: 35.1 % (ref 12.0–46.0)
Lymphs Abs: 1.7 10*3/uL (ref 0.7–4.0)
MCV: 92.2 fl (ref 78.0–100.0)
Monocytes Absolute: 0.4 10*3/uL (ref 0.1–1.0)
Monocytes Relative: 9.2 % (ref 3.0–12.0)
Neutro Abs: 2.2 10*3/uL (ref 1.4–7.7)
RDW: 13.3 % (ref 11.5–14.6)
WBC: 4.8 10*3/uL (ref 4.5–10.5)

## 2013-04-20 LAB — LIPID PANEL
Total CHOL/HDL Ratio: 4
Triglycerides: 149 mg/dL (ref 0.0–149.0)
VLDL: 29.8 mg/dL (ref 0.0–40.0)

## 2013-04-20 LAB — AST: AST: 29 U/L (ref 0–37)

## 2013-04-20 LAB — BASIC METABOLIC PANEL
BUN: 15 mg/dL (ref 6–23)
Chloride: 101 mEq/L (ref 96–112)
Creatinine, Ser: 1.1 mg/dL (ref 0.4–1.5)
GFR: 69.71 mL/min (ref >60.00)

## 2013-04-20 LAB — ALT: ALT: 31 U/L (ref 0–53)

## 2013-04-20 MED ORDER — METOPROLOL SUCCINATE ER 50 MG PO TB24: ORAL_TABLET | ORAL | Status: DC

## 2013-04-20 MED ORDER — SIMVASTATIN 40 MG PO TABS: ORAL_TABLET | ORAL | Status: DC

## 2013-04-20 MED ORDER — ZOSTER VACCINE LIVE 19400 UNT/0.65ML ~~LOC~~ SOLR: 0.6500 mL | Freq: Once | SUBCUTANEOUS | Status: DC

## 2013-04-20 MED ORDER — BUDESONIDE-FORMOTEROL FUMARATE 80-4.5 MCG/ACT IN AERO: 2.0000 | INHALATION_SPRAY | Freq: Two times a day (BID) | RESPIRATORY_TRACT | Status: DC

## 2013-04-20 NOTE — Progress Notes (Signed)
Subjective:    Patient ID: Kevin [name], male    DOB: 05/31/1945, 67 y.o.   MRN: [id number]  HPI Here for Medicare AWV:  1. Risk factors based on Past M, S, F history: reviewed 2. Physical [facility]:  [facility] 3. Depression/mood: neg screening  4. Hearing:  No problemss noted or reported  5. ADL's: independent   6. Fall Risk: prevention discussed , no recent falls  7. home Safety: does feel safe at home  8. Height, weight, & visual acuity: see VS, vision is ok, new glasses 4 months ago 9. Counseling: provided 10. Labs ordered based on risk factors: if needed  11. Referral Coordination: if needed 12. Care Plan, see assessment and plan  13. Cognitive Assessment: motor skills-cognition wnl  In addition, today we discussed the following: Hypertension, good medication compliance, ambulatory BPs 116/80. High cholesterol, good medication compliance, no apparent side effects COPD, restart spiriva? See review of systems.   Past Medical History:   COPD, PFTs 01-2006 mild obstruction, (+) reponse to Bronchodilators   Hypertension   Hyperlipidemia   shingles, thorax, 2008      Past Surgical History  Procedure Laterality Date  . No past surgeries     History   Social History  . Marital Status: Married    Spouse Name: N/A    Number of Children: 3  . Years of Education: N/A   Occupational History  . retired 01-2013 [employer]   Social History Main Topics  . Smoking status: Former Smoker    Quit date: 05/13/2004  . Smokeless tobacco: Never Used     Comment: used to smoke 2 ppd   . Alcohol Use: Yes     Comment: 2-3 beers daily  . Drug Use: No  . Sexual Activity: Not on file   Other Topics Concern  . Not on file   Social History Narrative  . No narrative on file   Family History  Problem Relation Age of Onset  . Colon cancer Neg Hx   . Prostate cancer Neg Hx   . Diabetes Neg Hx   . CAD Father     F MI ~ 65 y/o  . Hypertension Mother     Judie Petit and S      Review of Systems diet-- low fat, trying to eat healthy   No  CP,   lower extremity edema. No orthopnea  Some DOE going up stairs but occ at the treadmill x 30 minutes, try spiriva? No exertional cough  Denies  nausea, vomiting diarrhea Denies  blood in the stools Some  GERD  Sx but only ~ 1 x week  does cough most mornings, dry  (-) wheezing, (-) chest congestion  No dysuria, gross hematuria, difficulty urinating         Objective:   Physical Exam BP 155/82  Pulse 59  Temp(Src) 98 F (36.7 C)  Ht 5' 9.8" (1.773 m)  Wt 189 lb (85.73 kg)  BMI 27.27 kg/m2  SpO2 97% General -- alert, well-developed, NAD.  Neck --no thyromegaly,No JVD at 45 HEENT-- Not pale.  Lungs -- normal respiratory effort, no intercostal retractions, no accessory muscle use, and slt decreased breath sounds.  Heart-- normal rate, regular rhythm, no murmur.  Abdomen-- Not distended, good bowel sounds,soft, non-tender. Rectal-- No external abnormalities noted. Normal sphincter tone. No rectal masses or tenderness. No stools found Prostate--Prostate gland firm and smooth, no enlargement, nodularity, tenderness, mass, asymmetry or induration. Extremities-- no  pretibial edema bilaterally  Neurologic--  alert & oriented X3. Speech normal, gait normal, strength normal in all extremities.  Psych-- Cognition and judgment appear intact. Cooperative with normal attention span and concentration. No anxious appearing , no depressed appearing.      Assessment & Plan:

## 2013-04-20 NOTE — Assessment & Plan Note (Signed)
Compliance of medication, labs

## 2013-04-20 NOTE — Assessment & Plan Note (Addendum)
Td 2000 and  3- 2010 pneumonia shot 12-08  Had a  flu shot  Had shingles aprox 2008-- zostavax Rx was provided but never used, another rx provided today  Cscope x 2  (Dr Doreatha Martin) , last coloscopy 11-2008 negative, next in 10 years DRE normal today, check a PSA this year. discussed diet  and exercise

## 2013-04-20 NOTE — Assessment & Plan Note (Addendum)
Had PFTs in 2007, showed mild to moderate obstruction, he had a positive response to albuterol.  Last chest x-ray 2012 Complain of dyspnea on exertion only when she goes upstairs, would like to revisit Spiriva however he had hives w/ it thus will try symbicort, prescription provided, if he is not better he will let me know. Consider d/c BB EKG today normal sinus rhythm. No chest pain. Marland Kitchen

## 2013-04-20 NOTE — Assessment & Plan Note (Signed)
Well controlled, labs  

## 2013-04-20 NOTE — Progress Notes (Signed)
Pre visit review using our clinic review tool, if applicable. No additional management support is needed unless otherwise documented below in the visit note. 

## 2013-04-20 NOTE — Patient Instructions (Signed)
Get your blood work before you leave  Next visit for a  follow up on breathing  , no fasting, in 3 months  Please make an appointment     Fall Prevention and Home Safety Falls cause injuries and can affect all age groups. It is possible to use preventive measures to significantly decrease the likelihood of falls. There are many simple measures which can make your home safer and prevent falls. OUTDOORS  Repair cracks and edges of walkways and driveways.  Remove high doorway thresholds.  Trim shrubbery on the main path into your home.  Have good outside lighting.  Clear walkways of tools, rocks, debris, and clutter.  Check that handrails are not broken and are securely fastened. Both sides of steps should have handrails.  Have leaves, snow, and ice cleared regularly.  Use sand or salt on walkways during winter months.  In the garage, clean up grease or oil spills. BATHROOM  Install night lights.  Install grab bars by the toilet and in the tub and shower.  Use non-skid mats or decals in the tub or shower.  Place a plastic non-slip stool in the shower to sit on, if needed.  Keep floors dry and clean up all water on the floor immediately.  Remove soap buildup in the tub or shower on a regular basis.  Secure bath mats with non-slip, double-sided rug tape.  Remove throw rugs and tripping hazards from the floors. BEDROOMS  Install night lights.  Make sure a bedside light is easy to reach.  Do not use oversized bedding.  Keep a telephone by your bedside.  Have a firm chair with side arms to use for getting dressed.  Remove throw rugs and tripping hazards from the floor. KITCHEN  Keep handles on pots and pans turned toward the center of the stove. Use back burners when possible.  Clean up spills quickly and allow time for drying.  Avoid walking on wet floors.  Avoid hot utensils and knives.  Position shelves so they are not too high or low.  Place commonly  used objects within easy reach.  If necessary, use a sturdy step stool with a grab bar when reaching.  Keep electrical cables out of the way.  Do not use floor polish or wax that makes floors slippery. If you must use wax, use non-skid floor wax.  Remove throw rugs and tripping hazards from the floor. STAIRWAYS  Never leave objects on stairs.  Place handrails on both sides of stairways and use them. Fix any loose handrails. Make sure handrails on both sides of the stairways are as long as the stairs.  Check carpeting to make sure it is firmly attached along stairs. Make repairs to worn or loose carpet promptly.  Avoid placing throw rugs at the top or bottom of stairways, or properly secure the rug with carpet tape to prevent slippage. Get rid of throw rugs, if possible.  Have an electrician put in a light switch at the top and bottom of the stairs. OTHER FALL PREVENTION TIPS  Wear low-heel or rubber-soled shoes that are supportive and fit well. Wear closed toe shoes.  When using a stepladder, make sure it is fully opened and both spreaders are firmly locked. Do not climb a closed stepladder.  Add color or contrast paint or tape to grab bars and handrails in your home. Place contrasting color strips on first and last steps.  Learn and use mobility aids as needed. Install an electrical emergency response system.  Turn on lights to avoid dark areas. Replace light bulbs that burn out immediately. Get light switches that glow.  Arrange furniture to create clear pathways. Keep furniture in the same place.  Firmly attach carpet with non-skid or double-sided tape.  Eliminate uneven floor surfaces.  Select a carpet pattern that does not visually hide the edge of steps.  Be aware of all pets. OTHER HOME SAFETY TIPS  Set the water temperature for 120 F (48.8 C).  Keep emergency numbers on or near the telephone.  Keep smoke detectors on every level of the home and near sleeping  areas. Document Released: 04/19/2002 Document Revised: 10/29/2011 Document Reviewed: 07/19/2011 [name] 2014 [place].

## 2013-04-26 ENCOUNTER — Encounter: Payer: Self-pay | Admitting: *Deleted

## 2013-05-03 ENCOUNTER — Telehealth: Payer: Self-pay | Admitting: *Deleted

## 2013-05-03 NOTE — Telephone Encounter (Signed)
error 

## 2013-05-10 ENCOUNTER — Ambulatory Visit (INDEPENDENT_AMBULATORY_CARE_PROVIDER_SITE_OTHER): Payer: Medicare Other | Admitting: Family Medicine

## 2013-05-10 ENCOUNTER — Encounter: Payer: Self-pay | Admitting: Family Medicine

## 2013-05-10 VITALS — BP 128/84 | HR 82 | Temp 98.4°F | Resp 16 | Wt 180.4 lb

## 2013-05-10 DIAGNOSIS — J441 Chronic obstructive pulmonary disease with (acute) exacerbation: Secondary | ICD-10-CM

## 2013-05-10 MED ORDER — IPRATROPIUM BROMIDE 0.02 % IN SOLN
0.5000 mg | Freq: Once | RESPIRATORY_TRACT | Status: AC
  Administered 2013-05-10: 0.5 mg via RESPIRATORY_TRACT

## 2013-05-10 MED ORDER — SULFAMETHOXAZOLE-TMP DS 800-160 MG PO TABS: 1.0000 | ORAL_TABLET | Freq: Two times a day (BID) | ORAL | Status: DC

## 2013-05-10 MED ORDER — ALBUTEROL SULFATE HFA 108 (90 BASE) MCG/ACT IN AERS: 2.0000 | INHALATION_SPRAY | RESPIRATORY_TRACT | Status: DC | PRN

## 2013-05-10 MED ORDER — GUAIFENESIN-CODEINE 100-10 MG/5ML PO SYRP: 10.0000 mL | ORAL_SOLUTION | Freq: Three times a day (TID) | ORAL | Status: DC | PRN

## 2013-05-10 MED ORDER — ALBUTEROL SULFATE (5 MG/ML) 0.5% IN NEBU
2.5000 mg | INHALATION_SOLUTION | Freq: Once | RESPIRATORY_TRACT | Status: AC
  Administered 2013-05-10: 2.5 mg via RESPIRATORY_TRACT

## 2013-05-10 NOTE — Progress Notes (Signed)
   Subjective:    Patient ID: Kevin [name], male    DOB: January 18, 1946, 67 y.o.   MRN: [id number]  HPI COPD exacerbation- sxs started Friday.  No fever.  + nasal congestion.  + 'deep, rib cracking cough'- productive of green sputum.  No sinus pain/pressure.  No ear pain.  + sick contacts.  Has not been using Symbicort due to nausea.   Review of Systems For ROS see HPI     Objective:   Physical Exam  Vitals reviewed. Constitutional: He appears well-developed and well-nourished. No distress.  HENT:  Head: Normocephalic and atraumatic.  Right Ear: Tympanic membrane normal.  Left Ear: Tympanic membrane normal.  Nose: No mucosal edema or rhinorrhea. Right sinus exhibits no maxillary sinus tenderness and no frontal sinus tenderness. Left sinus exhibits no maxillary sinus tenderness and no frontal sinus tenderness.  Mouth/Throat: Mucous membranes are normal. No oropharyngeal exudate, posterior oropharyngeal edema or posterior oropharyngeal erythema.  Eyes: Conjunctivae and EOM are normal. Pupils are equal, round, and reactive to light.  Neck: Normal range of motion. Neck supple.  Cardiovascular: Normal rate, regular rhythm and normal heart sounds.   Pulmonary/Chest: Effort normal. No respiratory distress. He has wheezes (poor airmovement diffusely w/ expiratory wheezes- improved s/p neb tx).  + hacking cough  Lymphadenopathy:    He has no cervical adenopathy.  Skin: Skin is warm and dry.          Assessment & Plan:

## 2013-05-10 NOTE — Progress Notes (Signed)
Pre visit review using our clinic review tool, if applicable. No additional management support is needed unless otherwise documented below in the visit note. 

## 2013-05-10 NOTE — Patient Instructions (Signed)
Follow up as needed Start the Bactrim- twice daily- take w/ food Drink plenty of fluids Use the Proair inhaler- 2 puffs every 4 hrs as needed for cough, wheezing, or shortness of breath Use the cough syrup as needed- will help w/ pain from coughing but also cause drowsiness REST! Call with any questions or concerns Hang in there! Happy New Year!!!

## 2013-05-11 NOTE — Assessment & Plan Note (Addendum)
New.  Pt's cough and wheezing improved s/p neb tx.  Start abx.  Albuterol prn.  Cough meds prn.  Reviewed supportive care and red flags that should prompt return.  Pt expressed understanding and is in agreement w/ plan.

## 2013-05-13 HISTORY — PX: DENTAL SURGERY: SHX609

## 2013-06-08 ENCOUNTER — Other Ambulatory Visit: Payer: Self-pay | Admitting: Internal Medicine

## 2013-06-09 ENCOUNTER — Telehealth: Payer: Self-pay | Admitting: Internal Medicine

## 2013-06-09 ENCOUNTER — Other Ambulatory Visit: Payer: Self-pay | Admitting: Internal Medicine

## 2013-06-09 NOTE — Telephone Encounter (Signed)
Relevant patient education mailed to patient.  

## 2013-06-10 ENCOUNTER — Other Ambulatory Visit: Payer: Self-pay | Admitting: *Deleted

## 2013-06-10 DIAGNOSIS — I10 Essential (primary) hypertension: Secondary | ICD-10-CM

## 2013-06-10 MED ORDER — METOPROLOL SUCCINATE ER 50 MG PO TB24
ORAL_TABLET | ORAL | Status: DC
Start: 2013-06-10 — End: 2014-06-30

## 2013-06-10 NOTE — Telephone Encounter (Signed)
Refill for [facility]oprolol [facility] in [place], pt aware.

## 2013-10-20 ENCOUNTER — Ambulatory Visit (INDEPENDENT_AMBULATORY_CARE_PROVIDER_SITE_OTHER): Payer: Medicare Other | Admitting: Internal Medicine

## 2013-10-20 ENCOUNTER — Encounter: Payer: Self-pay | Admitting: Internal Medicine

## 2013-10-20 ENCOUNTER — Telehealth: Payer: Self-pay | Admitting: Internal Medicine

## 2013-10-20 VITALS — BP 146/85 | HR 71 | Temp 98.0°F | Wt 189.0 lb

## 2013-10-20 DIAGNOSIS — J449 Chronic obstructive pulmonary disease, unspecified: Secondary | ICD-10-CM

## 2013-10-20 DIAGNOSIS — R739 Hyperglycemia, unspecified: Secondary | ICD-10-CM

## 2013-10-20 DIAGNOSIS — R609 Edema, unspecified: Secondary | ICD-10-CM

## 2013-10-20 DIAGNOSIS — L989 Disorder of the skin and subcutaneous tissue, unspecified: Secondary | ICD-10-CM

## 2013-10-20 DIAGNOSIS — I10 Essential (primary) hypertension: Secondary | ICD-10-CM

## 2013-10-20 DIAGNOSIS — E785 Hyperlipidemia, unspecified: Secondary | ICD-10-CM

## 2013-10-20 DIAGNOSIS — R7309 Other abnormal glucose: Secondary | ICD-10-CM

## 2013-10-20 DIAGNOSIS — Z0000 Encounter for general adult medical examination without abnormal findings: Secondary | ICD-10-CM

## 2013-10-20 LAB — LIPID PANEL
CHOL/HDL RATIO: 5
Cholesterol: 233 mg/dL — ABNORMAL HIGH (ref 0–200)
HDL: 44.2 mg/dL (ref >39.00)
LDL Cholesterol: 142 mg/dL — ABNORMAL HIGH (ref 0–99)
NONHDL: 188.8
TRIGLYCERIDES: 235 mg/dL — AB (ref 0.0–149.0)
VLDL: 47 mg/dL — ABNORMAL HIGH (ref 0.0–40.0)

## 2013-10-20 LAB — HEMOGLOBIN A1C: HEMOGLOBIN A1C: 5.8 % (ref 4.6–6.5)

## 2013-10-20 LAB — BASIC METABOLIC PANEL
BUN: 15 mg/dL (ref 6–23)
CALCIUM: 9.6 mg/dL (ref 8.4–10.5)
CO2: 29 mEq/L (ref 19–32)
CREATININE: 1 mg/dL (ref 0.4–1.5)
Chloride: 101 mEq/L (ref 96–112)
GFR: 75.04 mL/min (ref >60.00)
GLUCOSE: 92 mg/dL (ref 70–99)
Potassium: 4.1 mEq/L (ref 3.5–5.1)
Sodium: 139 mEq/L (ref 135–145)

## 2013-10-20 NOTE — Assessment & Plan Note (Addendum)
2 months history of lower extremity edema, symptoms started after a trip to [place], edema was symmetric, low suspicion for  clots. Better  low-salt diet and leg elevation EKG today normal sinus rhythm Plan: Continue with low salt diet and leg elevation, will call if symptoms increase BMP

## 2013-10-20 NOTE — Assessment & Plan Note (Signed)
Chronic appearing skin lesion at the right side of the face, refer  to dermatology

## 2013-10-20 NOTE — Progress Notes (Signed)
   Subjective:    Patient ID: Kevin [name], male    DOB: 07/04/1945, 68 y.o.   MRN: [id number]  DOS:  10/20/2013 Type of  Visit: acute History: 2 months history of lower extremity edema around the ankles and feet, edema is symmetric, not necessarily worse at the end of the day. Symptoms started shortly after a car trip to [place] 3 weeks ago ; started with a low-salt diet and leg elevation and edema has decreased significantly. He is not  taking Motrin or similar medications.  COPD, was intolerant to Symbicort and albuterol, "made me feel weird and spaced". He actually started going to the gym  and his breathing is better.  Self discontinue statins about 3 or 4 months ago based on " a bad TV report". Would like his blood recheck. On  chart review, he has some few elevated CBGs, will check the A1c   ROS Denies chest pain, difficulty breathing or palpitations. No orthopnea. No nausea, vomiting, diarrhea or blood in the stools.     Past Medical History  Diagnosis Date  . COPD (chronic obstructive pulmonary disease)     PFTs 01-2006 mild obstruction, (+) reponse to Bronchodilators    . Hypertension   . Hyperlipidemia   . Shingles     Thorax, 2008    Past Surgical History  Procedure Laterality Date  . No past surgeries      History   Social History  . Marital Status: Married    Spouse Name: N/A    Number of Children: 3  . Years of Education: N/A   Occupational History  . retired 01-2013 [employer]   Social History Main Topics  . Smoking status: Former Smoker    Quit date: 05/13/2004  . Smokeless tobacco: Never Used     Comment: used to smoke 2 ppd   . Alcohol Use: Yes     Comment: 2-3 beers daily  . Drug Use: No  . Sexual Activity: Not on file   Other Topics Concern  . Not on file   Social History Narrative  . No narrative on file        Medication List       This list is accurate as of: 10/20/13 11:59 PM.  Always use your most recent med list.               aspirin 81 MG tablet  Take 81 mg by mouth daily.     metoprolol succinate 50 MG 24 hr tablet  Commonly known as:  TOPROL-XL  TAKE 1 TABLET EVERY DAY           Objective:   Physical Exam BP 146/85  Pulse 71  Temp(Src) 98 F (36.7 C)  Wt 189 lb (85.73 kg)  SpO2 93% General -- alert, well-developed, NAD.  Neck --no JVD at 45 HEENT-- Not pale.  Chronic appearing skin lesion approximately 1x0.3 cm at the right cheek. Lungs -- normal respiratory effort, no intercostal retractions, no accessory muscle use, and slt decreased breath sounds.  Heart-- normal rate, regular rhythm, no murmur.   Extremities-- no pretibial edema bilaterally , normal pedal pulses  Neurologic--  alert & oriented X3. Speech normal, gait appropriate for age, strength symmetric and appropriate for age.  Psych-- Cognition and judgment appear intact. Cooperative with normal attention span and concentration. No anxious or depressed appearing.        Assessment & Plan:

## 2013-10-20 NOTE — Telephone Encounter (Signed)
Relevant patient education mailed to patient.  

## 2013-10-20 NOTE — Assessment & Plan Note (Signed)
Self discontinue Zocor base on "poor  TV  Reports",  did not have any side effects. Would like his FLP rechecked

## 2013-10-20 NOTE — Assessment & Plan Note (Signed)
States he did get a Zostavax injection

## 2013-10-20 NOTE — Progress Notes (Signed)
Pre visit review using our clinic review tool, if applicable. No additional management support is needed unless otherwise documented below in the visit note. 

## 2013-10-20 NOTE — Assessment & Plan Note (Addendum)
Patient was intolerant to inhalers. Currently doing better after he started going to the gym. Plan: At some point consider referral to pulmonary, he is intolerant to multiple inhalers

## 2013-10-20 NOTE — Patient Instructions (Signed)
Get your blood work before you leave   Continuing a low-salt diet and leg elevation, if the swelling comes  back please let me know.    Next visit is for routine check up    in 3 months, may need to come back fasting

## 2013-10-21 ENCOUNTER — Encounter: Payer: Self-pay | Admitting: Internal Medicine

## 2013-10-25 MED ORDER — SIMVASTATIN 40 MG PO TABS: ORAL_TABLET | ORAL | Status: DC

## 2013-10-25 NOTE — Addendum Note (Signed)
Addended by: Peggyann Shoals on: 10/25/2013 09:02 AM   Modules accepted: Orders

## 2014-02-21 ENCOUNTER — Ambulatory Visit (INDEPENDENT_AMBULATORY_CARE_PROVIDER_SITE_OTHER): Payer: Medicare Other

## 2014-02-21 DIAGNOSIS — Z23 Encounter for immunization: Secondary | ICD-10-CM

## 2014-03-23 ENCOUNTER — Encounter: Payer: Self-pay | Admitting: Internal Medicine

## 2014-03-23 ENCOUNTER — Ambulatory Visit (INDEPENDENT_AMBULATORY_CARE_PROVIDER_SITE_OTHER): Payer: Medicare Other | Admitting: Internal Medicine

## 2014-03-23 VITALS — BP 142/89 | HR 68 | Temp 98.1°F | Ht 71.0 in | Wt 182.1 lb

## 2014-03-23 DIAGNOSIS — E785 Hyperlipidemia, unspecified: Secondary | ICD-10-CM

## 2014-03-23 DIAGNOSIS — Z0000 Encounter for general adult medical examination without abnormal findings: Secondary | ICD-10-CM

## 2014-03-23 DIAGNOSIS — R739 Hyperglycemia, unspecified: Secondary | ICD-10-CM

## 2014-03-23 DIAGNOSIS — I10 Essential (primary) hypertension: Secondary | ICD-10-CM

## 2014-03-23 DIAGNOSIS — Z23 Encounter for immunization: Secondary | ICD-10-CM

## 2014-03-23 NOTE — Assessment & Plan Note (Signed)
prevnar today 

## 2014-03-23 NOTE — Assessment & Plan Note (Signed)
Mild hyperglycemia with a A1c of 5.8, recheck.

## 2014-03-23 NOTE — Progress Notes (Signed)
Pre visit review using our clinic review tool, if applicable. No additional management support is needed unless otherwise documented below in the visit note. 

## 2014-03-23 NOTE — Assessment & Plan Note (Signed)
Continue with metoprolol, well-controlled

## 2014-03-23 NOTE — Patient Instructions (Signed)
Get your blood work before you leave     Please come back to the office in 4 months  for a physical exam.  Depending on your labs,  you may need to come back  fasting Schedule the visit at the front desk

## 2014-03-23 NOTE — Progress Notes (Signed)
   Subjective:    Patient ID: Kevin [name], male    DOB: 05-13-1946, 68 y.o.   MRN: [id number]  DOS:  03/23/2014 Type of visit - description : ROV Interval history: High cholesterol, restarted simvastatin, good compliance, no apparent side effects Hypertension, good compliance with medications, BP today slightly elevated but at home is always within normal, never higher than 130/80.   ROS Continue to be very active, goes to the [facility] 3 times a week. No chest pain, difficulty breathing No nausea, vomiting, diarrhea  Past Medical History  Diagnosis Date  . COPD (chronic obstructive pulmonary disease)     PFTs 01-2006 mild obstruction, (+) reponse to Bronchodilators    . Hypertension   . Hyperlipidemia   . Shingles     Thorax, 2008    Past Surgical History  Procedure Laterality Date  . No past surgeries      History   Social History  . Marital Status: Married    Spouse Name: N/A    Number of Children: 3  . Years of Education: N/A   Occupational History  . retired 01-2013 [employer]   Social History Main Topics  . Smoking status: Former Smoker    Quit date: 05/13/2004  . Smokeless tobacco: Never Used     Comment: used to smoke 2 ppd   . Alcohol Use: 0.0 oz/week    0 Not specified per week     Comment: 2-3 beers modt days  . Drug Use: No  . Sexual Activity: Not on file   Other Topics Concern  . Not on file   Social History Narrative        Medication List       This list is accurate as of: 03/23/14  2:50 PM.  Always use your most recent med list.               aspirin 81 MG tablet  Take 81 mg by mouth daily.     metoprolol succinate 50 MG 24 hr tablet  Commonly known as:  TOPROL-XL  TAKE 1 TABLET EVERY DAY     simvastatin 40 MG tablet  Commonly known as:  ZOCOR  TAKE 1 TABLET AT BEDTIME     VITAMIN B6 PO  Take 1 tablet by mouth daily.           Objective:   Physical Exam BP 142/89 mmHg  Pulse 68  Temp(Src) 98.1 F (36.7 C) (Oral)  Ht  5\' 11"  (1.803 m)  Wt 182 lb 2 oz (82.611 kg)  BMI 25.41 kg/m2  SpO2 97% General -- alert, well-developed, NAD. Lungs -- normal respiratory effort, no intercostal retractions, no accessory muscle use, and normal breath sounds.  Heart-- normal rate, regular rhythm, no murmur.   Extremities-- no pretibial edema bilaterally  Neurologic--  alert & oriented X3. Speech normal, gait appropriate for age, strength symmetric and appropriate for age.  Psych-- Cognition and judgment appear intact. Cooperative with normal attention span and concentration. No anxious or depressed appearing.        Assessment & Plan:

## 2014-03-23 NOTE — Assessment & Plan Note (Signed)
Restarted simvastatin, good compliance, no apparent side effects, check labs

## 2014-03-24 LAB — LIPID PANEL
CHOLESTEROL: 142 mg/dL (ref 0–200)
HDL: 45.7 mg/dL (ref >39.00)
LDL Cholesterol: 73 mg/dL (ref 0–99)
NONHDL: 96.3
Total CHOL/HDL Ratio: 3
Triglycerides: 119 mg/dL (ref 0.0–149.0)
VLDL: 23.8 mg/dL (ref 0.0–40.0)

## 2014-03-24 LAB — AST: AST: 33 U/L (ref 0–37)

## 2014-03-24 LAB — HEMOGLOBIN A1C: HEMOGLOBIN A1C: 5.7 % (ref 4.6–6.5)

## 2014-03-24 LAB — ALT: ALT: 33 U/L (ref 0–53)

## 2014-06-30 ENCOUNTER — Other Ambulatory Visit: Payer: Self-pay | Admitting: Internal Medicine

## 2014-07-26 ENCOUNTER — Other Ambulatory Visit: Payer: Self-pay | Admitting: Internal Medicine

## 2014-09-13 ENCOUNTER — Encounter: Payer: Self-pay | Admitting: Gastroenterology

## 2014-09-20 ENCOUNTER — Telehealth: Payer: Self-pay | Admitting: Internal Medicine

## 2014-09-20 NOTE — Telephone Encounter (Signed)
Pre Visit letter sent  °

## 2014-10-11 ENCOUNTER — Ambulatory Visit (HOSPITAL_BASED_OUTPATIENT_CLINIC_OR_DEPARTMENT_OTHER)
Admission: RE | Admit: 2014-10-11 | Discharge: 2014-10-11 | Disposition: A | Discharge status: Home / Self Care | Payer: PPO | Source: Ambulatory Visit | Attending: Internal Medicine | Admitting: Internal Medicine

## 2014-10-11 ENCOUNTER — Ambulatory Visit (INDEPENDENT_AMBULATORY_CARE_PROVIDER_SITE_OTHER): Payer: PPO | Admitting: Internal Medicine

## 2014-10-11 ENCOUNTER — Encounter: Payer: Self-pay | Admitting: Internal Medicine

## 2014-10-11 VITALS — BP 118/76 | HR 56 | Temp 98.0°F | Ht 71.0 in | Wt 182.2 lb

## 2014-10-11 DIAGNOSIS — Z0000 Encounter for general adult medical examination without abnormal findings: Secondary | ICD-10-CM | POA: Diagnosis not present

## 2014-10-11 DIAGNOSIS — E785 Hyperlipidemia, unspecified: Secondary | ICD-10-CM

## 2014-10-11 DIAGNOSIS — R399 Unspecified symptoms and signs involving the genitourinary system: Secondary | ICD-10-CM | POA: Diagnosis not present

## 2014-10-11 DIAGNOSIS — J410 Simple chronic bronchitis: Secondary | ICD-10-CM

## 2014-10-11 DIAGNOSIS — J449 Chronic obstructive pulmonary disease, unspecified: Secondary | ICD-10-CM | POA: Insufficient documentation

## 2014-10-11 DIAGNOSIS — I10 Essential (primary) hypertension: Secondary | ICD-10-CM

## 2014-10-11 DIAGNOSIS — R05 Cough: Secondary | ICD-10-CM | POA: Insufficient documentation

## 2014-10-11 LAB — BASIC METABOLIC PANEL
BUN: 12 mg/dL (ref 6–23)
CO2: 26 mEq/L (ref 19–32)
CREATININE: 0.97 mg/dL (ref 0.40–1.50)
Calcium: 9.1 mg/dL (ref 8.4–10.5)
Chloride: 104 mEq/L (ref 96–112)
GFR: 81.1 mL/min (ref >60.00)
Glucose, Bld: 95 mg/dL (ref 70–99)
Potassium: 4.3 mEq/L (ref 3.5–5.1)
Sodium: 138 mEq/L (ref 135–145)

## 2014-10-11 LAB — URINALYSIS, ROUTINE W REFLEX MICROSCOPIC
BILIRUBIN URINE: NEGATIVE
Ketones, ur: NEGATIVE
Leukocytes, UA: NEGATIVE
NITRITE: NEGATIVE
PH: 6 (ref 5.0–8.0)
Specific Gravity, Urine: 1.015 (ref 1.000–1.030)
Total Protein, Urine: NEGATIVE
Urine Glucose: NEGATIVE
Urobilinogen, UA: 0.2 (ref 0.0–1.0)

## 2014-10-11 LAB — LIPID PANEL
CHOLESTEROL: 158 mg/dL (ref 0–200)
HDL: 50.9 mg/dL (ref >39.00)
LDL Cholesterol: 76 mg/dL (ref 0–99)
NONHDL: 107.1
Total CHOL/HDL Ratio: 3
Triglycerides: 158 mg/dL — ABNORMAL HIGH (ref 0.0–149.0)
VLDL: 31.6 mg/dL (ref 0.0–40.0)

## 2014-10-11 LAB — CBC WITH DIFFERENTIAL/PLATELET
HCT: 44.1 % (ref 39.0–52.0)
Hemoglobin: 15.1 g/dL (ref 13.0–17.0)
MCHC: 34.3 g/dL (ref 30.0–36.0)
MCV: 93.6 fl (ref 78.0–100.0)
PLATELETS: 298 10*3/uL (ref 150.0–400.0)
RBC: 4.71 Mil/uL (ref 4.22–5.81)
RDW: 13.1 % (ref 11.5–15.5)
WBC: 5.9 10*3/uL (ref 4.0–10.5)

## 2014-10-11 LAB — PSA: PSA: 0.73 ng/mL (ref 0.10–4.00)

## 2014-10-11 LAB — AST: AST: 22 U/L (ref 0–37)

## 2014-10-11 LAB — ALT: ALT: 20 U/L (ref 0–53)

## 2014-10-11 MED ORDER — SIMVASTATIN 40 MG PO TABS: 40.0000 mg | ORAL_TABLET | Freq: Every day | ORAL | Status: DC

## 2014-10-11 MED ORDER — METOPROLOL SUCCINATE ER 50 MG PO TB24: 50.0000 mg | ORAL_TABLET | Freq: Every day | ORAL | Status: DC

## 2014-10-11 NOTE — Assessment & Plan Note (Signed)
Good compliance with simvastatin, labs

## 2014-10-11 NOTE — Assessment & Plan Note (Signed)
On beta blockers, BP very good, labs

## 2014-10-11 NOTE — Assessment & Plan Note (Addendum)
LUTS with normal DRE. Check a PSA and a UA, discuss a trial with Flomax (declined)

## 2014-10-11 NOTE — Progress Notes (Signed)
Subjective:    Patient ID: Kevin [name], male    DOB: Jul 16, 1945, 69 y.o.   MRN: [id number]  DOS:  10/11/2014 Type of visit - description :    Here for Medicare AWV:  1. Risk factors based on Past M, S, F history: reviewed 2. Physical [facility] 3. Depression/mood: neg screening   4. Hearing:  No problemss noted or reported  5. ADL's: independent    6. Fall Risk: prevention discussed , no recent falls   7. home Safety: does feel safe at home   8. Height, weight, & visual acuity: see VS, vision is ok, due for a visit , encouraged to call  9. Counseling: provided 10. Labs ordered based on risk factors: if needed   11. Referral Coordination: if needed 12. Care Plan, see assessment and plan   13. Cognitive Assessment: motor skills-cognition wnl 14. Care team updated 15. End-of-life care discussed, including a living will   In addition, today we discussed the following: High cholesterol, good compliance with simvastatin. Hypertension, on beta blockers, BP very good today Complaining of cough, this is going on for more than a year, on and off, very rarely produce some clear sputum. No associated symptoms other than postnasal dripping and occasional heartburn. See review of systems   Review of Systems Constitutional: No fever. No chills. No unexplained wt changes. No unusual sweats  HEENT: No dental problems, no ear discharge, no facial swelling, no voice changes. No eye discharge, no eye  redness , no  intolerance to light   Respiratory: No wheezing , no  difficulty breathing.   Cardiovascular: No CP, no leg swelling , no  Palpitations  GI: no nausea, no vomiting, no diarrhea , no  abdominal pain.  No blood in the stools. No dysphagia, no odynophagia. There are rarely has heartburn    Endocrine: No polyphagia, no polyuria , no polydipsia  GU:   reports nocturia once or twice at night, sometimes difficulty urinating "stopt and stop". No gross  hematuria or dysuria.    Musculoskeletal:  B ankle swelling on and off, day or night. No unusual aches or pains  Skin: No change in the color of the skin, palor , no  Rash  Allergic, immunologic:  occasional postnasal dripping, no sneezing, itchy eyes or itchy nose   Neurological: No dizziness no  syncope. No headaches. No diplopia, no slurred, no slurred speech, no motor deficits, no facial  Numbness  Hematological: No enlarged lymph nodes, no easy bruising , no unusual bleedings  Psychiatry: No suicidal ideas, no hallucinations, no beavior problems, no confusion.  No unusual/severe anxiety, no depression    Past Medical History  Diagnosis Date  . COPD (chronic obstructive pulmonary disease)     PFTs 01-2006 mild obstruction, (+) reponse to Bronchodilators    . Hypertension   . Hyperlipidemia   . Shingles     Thorax, 2008    Past Surgical History  Procedure Laterality Date  . No past surgeries      History   Social History  . Marital Status: Married    Spouse Name: N/A  . Number of Children: 3  . Years of Education: N/A   Occupational History  . retired 01-2013 [employer]   Social History Main Topics  . Smoking status: Former Smoker    Quit date: 05/13/2004  . Smokeless tobacco: Never Used     Comment: used to smoke 2 ppd   .  Alcohol Use: 0.0 oz/week    0 Standard drinks or equivalent per week     Comment: 2-3 beers most days  . Drug Use: No  . Sexual Activity: Not on file   Other Topics Concern  . Not on file   Social History Narrative     Family History  Problem Relation Age of Onset  . Colon cancer Neg Hx   . Prostate cancer Neg Hx   . Diabetes Neg Hx   . CAD Father     F MI ~ 53 y/o  . Hypertension Mother     Jerilynn Mages and S       Medication List       This list is accurate as of: 10/11/14  6:00 PM.  Always use your most recent med list.               aspirin 81 MG tablet  Take 81 mg by mouth daily.     calcium carbonate 600 MG Tabs tablet    Commonly known as:  OS-CAL  Take 600 mg by mouth daily with breakfast.     metoprolol succinate 50 MG 24 hr tablet  Commonly known as:  TOPROL-XL  Take 1 tablet (50 mg total) by mouth daily. Take with or immediately following a meal.     MULTIVITAMIN PO  Take 1 tablet by mouth daily.     simvastatin 40 MG tablet  Commonly known as:  ZOCOR  Take 1 tablet (40 mg total) by mouth at bedtime.     VITAMIN B6 PO  Take 1 tablet by mouth daily.           Objective:   Physical Exam BP 118/76 mmHg  Pulse 56  Temp(Src) 98 F (36.7 C) (Oral)  Ht 5\' 11"  (1.803 m)  Wt 182 lb 4 oz (82.668 kg)  BMI 25.43 kg/m2  SpO2 96% General:   Well developed, well nourished . NAD.  Neck:  Full range of motion. Supple. No  thyromegaly , normal carotid pulse HEENT:  Normocephalic . Face symmetric, atraumatic Lungs:  CTA B Normal respiratory effort, no intercostal retractions, no accessory muscle use. Heart: RRR,  no murmur.  No pretibial edema bilaterally  Abdomen:  Not distended, soft, non-tender. No rebound or rigidity.  Rectal:  External abnormalities: none. Normal sphincter tone. No rectal masses or tenderness.  Stool brown  Prostate: Prostate gland firm and smooth, no enlargement, nodularity, tenderness, mass, asymmetry or induration.  Skin: Exposed areas without rash. Not pale. Not jaundice MSK: Ankles normal to inspection on palpation Neurologic:  alert & oriented X3.  Speech normal, gait appropriate for age and unassisted Strength symmetric and appropriate for age.  Psych: Cognition and judgment appear intact.  Cooperative with normal attention span and concentration.  Behavior appropriate. No anxious or depressed appearing.        Assessment & Plan:     ankle edema? Recommend observation, reassess in 3-4 months when he comes back

## 2014-10-11 NOTE — Progress Notes (Signed)
Pre visit review using our clinic review tool, if applicable. No additional management support is needed unless otherwise documented below in the visit note. 

## 2014-10-11 NOTE — Patient Instructions (Signed)
Get your blood work before you leave   Stop by the first floor and get the XR     Fall Prevention and Home Safety Falls cause injuries and can affect all age groups. It is possible to use preventive measures to significantly decrease the likelihood of falls. There are many simple measures which can make your home safer and prevent falls. OUTDOORS  Repair cracks and edges of walkways and driveways.  Remove high doorway thresholds.  Trim shrubbery on the main path into your home.  Have good outside lighting.  Clear walkways of tools, rocks, debris, and clutter.  Check that handrails are not broken and are securely fastened. Both sides of steps should have handrails.  Have leaves, snow, and ice cleared regularly.  Use sand or salt on walkways during winter months.  In the garage, clean up grease or oil spills. BATHROOM  Install night lights.  Install grab bars by the toilet and in the tub and shower.  Use non-skid mats or decals in the tub or shower.  Place a plastic non-slip stool in the shower to sit on, if needed.  Keep floors dry and clean up all water on the floor immediately.  Remove soap buildup in the tub or shower on a regular basis.  Secure bath mats with non-slip, double-sided rug tape.  Remove throw rugs and tripping hazards from the floors. BEDROOMS  Install night lights.  Make sure a bedside light is easy to reach.  Do not use oversized bedding.  Keep a telephone by your bedside.  Have a firm chair with side arms to use for getting dressed.  Remove throw rugs and tripping hazards from the floor. KITCHEN  Keep handles on pots and pans turned toward the center of the stove. Use back burners when possible.  Clean up spills quickly and allow time for drying.  Avoid walking on wet floors.  Avoid hot utensils and knives.  Position shelves so they are not too high or low.  Place commonly used objects within easy reach.  If necessary, use a  sturdy step stool with a grab bar when reaching.  Keep electrical cables out of the way.  Do not use floor polish or wax that makes floors slippery. If you must use wax, use non-skid floor wax.  Remove throw rugs and tripping hazards from the floor. STAIRWAYS  Never leave objects on stairs.  Place handrails on both sides of stairways and use them. Fix any loose handrails. Make sure handrails on both sides of the stairways are as long as the stairs.  Check carpeting to make sure it is firmly attached along stairs. Make repairs to worn or loose carpet promptly.  Avoid placing throw rugs at the top or bottom of stairways, or properly secure the rug with carpet tape to prevent slippage. Get rid of throw rugs, if possible.  Have an electrician put in a light switch at the top and bottom of the stairs. OTHER FALL PREVENTION TIPS  Wear low-heel or rubber-soled shoes that are supportive and fit well. Wear closed toe shoes.  When using a stepladder, make sure it is fully opened and both spreaders are firmly locked. Do not climb a closed stepladder.  Add color or contrast paint or tape to grab bars and handrails in your home. Place contrasting color strips on first and last steps.  Learn and use mobility aids as needed. Install an electrical emergency response system.  Turn on lights to avoid dark areas. Replace light bulbs  that burn out immediately. Get light switches that glow.  Arrange furniture to create clear pathways. Keep furniture in the same place.  Firmly attach carpet with non-skid or double-sided tape.  Eliminate uneven floor surfaces.  Select a carpet pattern that does not visually hide the edge of steps.  Be aware of all pets. OTHER HOME SAFETY TIPS  Set the water temperature for 120 F (48.8 C).  Keep emergency numbers on or near the telephone.  Keep smoke detectors on every level of the home and near sleeping areas. Document Released: 04/19/2002 Document Revised:  10/29/2011 Document Reviewed: 07/19/2011 [facility] Patient Information 2015 [place], [place]. This information is not intended to replace advice given to you by your health care provider. Make sure you discuss any questions you have with your health care provider.   Preventive Care for Adults Ages 37 and over  Blood pressure check.** / Every 1 to 2 years.  Lipid and cholesterol check.**/ Every 5 years beginning at age 63.  Lung cancer screening. / Every year if you are aged 30-80 years and have a 30-pack-year history of smoking and currently smoke or have quit within the past 15 years. Yearly screening is stopped once you have quit smoking for at least 15 years or develop a health problem that would prevent you from having lung cancer treatment.  Fecal occult blood test (FOBT) of stool. / Every year beginning at age 66 and continuing until age 15. You may not have to do this test if you get a colonoscopy every 10 years.  Flexible sigmoidoscopy** or colonoscopy.** / Every 5 years for a flexible sigmoidoscopy or every 10 years for a colonoscopy beginning at age 62 and continuing until age 25.  Hepatitis C blood test.** / For all people born from [birth year] through 1967 and any individual with known risks for hepatitis C.  Abdominal aortic aneurysm (AAA) screening.** / A one-time screening for ages 26 to 87 years who are current or former smokers.  Skin self-exam. / Monthly.  Influenza vaccine. / Every year.  Tetanus, diphtheria, and acellular pertussis (Tdap/Td) vaccine.** / 1 dose of Td every 10 years.  Varicella vaccine.** / Consult your health care provider.  Zoster vaccine.** / 1 dose for adults aged 75 years or older.  Pneumococcal 13-valent conjugate (PCV13) vaccine.** / Consult your health care provider.  Pneumococcal polysaccharide (PPSV23) vaccine.** / 1 dose for all adults aged 48 years and older.  Meningococcal vaccine.** / Consult your health care provider.  Hepatitis A  vaccine.** / Consult your health care provider.  Hepatitis B vaccine.** / Consult your health care provider.  Haemophilus influenzae type b (Hib) vaccine.** / Consult your health care provider. **Family history and personal history of risk and conditions may change your health care provider's recommendations. Document Released: 06/25/2001 Document Revised: 05/04/2013 Document Reviewed: 09/24/2010 [facility] Patient Information 2015 [employer] [place], [place]. This information is not intended to replace advice given to you by your health care provider. Make sure you discuss any questions you have with your health care provider.

## 2014-10-11 NOTE — Assessment & Plan Note (Addendum)
Former smoker with history of COPD presents with cough. Review of systems indicate postnasal dripping and occasional GERD. No DOE or chronic wheezing, history of intolerance to a number of inhalers Plan: Chest x-ray Flonase, Prilosec. If not improving he will let me know. Also discussed  lung cancer screening, likes to wait and discuss in few months

## 2014-10-11 NOTE — Assessment & Plan Note (Signed)
Td --2010 pneumonia shot 12-08  prevnar 03-2014 Had a flu shot   zostavax - ~ 2015   Cscope x 2 (Dr Inocente Salles) , last coloscopy 11-2008 negative, next in 10 years  DRE normal today, check a PSA this year. discussed diet and exercise

## 2015-11-01 ENCOUNTER — Other Ambulatory Visit: Payer: Self-pay | Admitting: Internal Medicine

## 2015-11-17 ENCOUNTER — Ambulatory Visit (INDEPENDENT_AMBULATORY_CARE_PROVIDER_SITE_OTHER): Payer: PPO | Admitting: Medical

## 2015-11-17 ENCOUNTER — Encounter: Payer: Self-pay | Admitting: Medical

## 2015-11-17 VITALS — BP 120/80 | HR 65 | Temp 98.4°F | Ht 71.0 in | Wt 182.0 lb

## 2015-11-17 DIAGNOSIS — J209 Acute bronchitis, unspecified: Secondary | ICD-10-CM

## 2015-11-17 DIAGNOSIS — J0100 Acute maxillary sinusitis, unspecified: Secondary | ICD-10-CM | POA: Diagnosis not present

## 2015-11-17 MED ORDER — AZITHROMYCIN 250 MG PO TABS: ORAL_TABLET | ORAL | Status: DC

## 2015-11-17 MED ORDER — FLUTICASONE PROPIONATE 50 MCG/ACT NA SUSP: 2.0000 | Freq: Every day | NASAL | Status: DC

## 2015-11-17 MED ORDER — BENZONATATE 200 MG PO CAPS: 200.0000 mg | ORAL_CAPSULE | Freq: Three times a day (TID) | ORAL | Status: DC | PRN

## 2015-11-17 NOTE — Progress Notes (Signed)
Subjective:    Patient ID: TYQWAN RUDNIK, male    DOB: 29-Apr-1946, 70 y.o.   MRN: [id number]  HPI  Pt in for cough and nasal congestion for 2 days. Congested in chest and in nose. Some colored/productive cough.  No fever. Then states slight sweat at night and when coughs. Pt has hx of copd. Pt states not wheezing.No inhaler use on regular basis. Pt used to smoke.    Review of Systems  Constitutional: Positive for diaphoresis. Negative for fever, chills and fatigue.  HENT: Positive for congestion and sinus pressure. Negative for ear pain, postnasal drip, sneezing and sore throat.   Respiratory: Positive for cough. Negative for shortness of breath and wheezing.   Cardiovascular: Negative for chest pain and palpitations.  Gastrointestinal: Negative for abdominal pain.  Musculoskeletal: Negative for back pain.  Neurological: Negative for dizziness and headaches.  Hematological: Negative for adenopathy. Does not bruise/bleed easily.  Psychiatric/Behavioral: Negative for behavioral problems and confusion.    Past Medical History  Diagnosis Date  . COPD (chronic obstructive pulmonary disease) (HCC)     PFTs 01-2006 mild obstruction, (+) reponse to Bronchodilators    . Hypertension   . Hyperlipidemia   . Shingles     Thorax, 2008     Social History   Social History  . Marital Status: Married    Spouse Name: N/A  . Number of Children: 3  . Years of Education: N/A   Occupational History  . retired 01-2013 [employer]   Social History Main Topics  . Smoking status: Former Smoker    Quit date: 05/13/2004  . Smokeless tobacco: Never Used     Comment: used to smoke 2 ppd   . Alcohol Use: 0.0 oz/week    0 Standard drinks or equivalent per week     Comment: 2-3 beers most days  . Drug Use: No  . Sexual Activity: Not on file   Other Topics Concern  . Not on file   Social History Narrative    Past Surgical History  Procedure Laterality Date  . No past surgeries      Family  History  Problem Relation Age of Onset  . Colon cancer Neg Hx   . Prostate cancer Neg Hx   . Diabetes Neg Hx   . CAD Father     F MI ~ 30 y/o  . Hypertension Mother     M and S    Allergies  Allergen Reactions  . Tiotropium Bromide Monohydrate Hives    Current Outpatient Prescriptions on File Prior to Visit  Medication Sig Dispense Refill  . aspirin 81 MG tablet Take 81 mg by mouth daily.    . calcium carbonate (OS-CAL) 600 MG TABS tablet Take 600 mg by mouth daily with breakfast.    . metoprolol succinate (TOPROL-XL) 50 MG 24 hr tablet Take 1 tablet (50 mg total) by mouth daily. Take with or immediately following a meal. 90 tablet 3  . Multiple Vitamins-Minerals (MULTIVITAMIN PO) Take 1 tablet by mouth daily.    . Pyridoxine HCl (VITAMIN B6 PO) Take 1 tablet by mouth daily.    . simvastatin (ZOCOR) 40 MG tablet Take 1 tablet (40 mg total) by mouth at bedtime. 90 tablet 0   No current facility-administered medications on file prior to visit.    BP 120/80 mmHg  Pulse 65  Temp(Src) 98.4 F (36.9 C) (Oral)  Ht 5\' 11"  (1.803 m)  Wt 182 lb (82.555 kg)  BMI 25.40 kg/m2  SpO2 98%       Objective:   Physical Exam  General  Mental Status - Alert. General Appearance - Well groomed. Not in acute distress.  Skin Rashes- No Rashes.  HEENT Head- Normal. Ear Auditory Canal - Left- Normal. Right - Normal.Tympanic Membrane- Left- Normal. Right- Normal. Eye Sclera/Conjunctiva- Left- Normal. Right- Normal. Nose & Sinuses Nasal Mucosa- Left-  Boggy and Congested. Right-  Boggy and  Congested.maxillary sinus pressure but no frontal sinus pressure. Mouth & Throat Lips: Upper Lip- Normal: no dryness, cracking, pallor, cyanosis, or vesicular eruption. Lower Lip-Normal: no dryness, cracking, pallor, cyanosis or vesicular eruption. Buccal Mucosa- Bilateral- No Aphthous ulcers. Oropharynx- No Discharge or Erythema. Tonsils: Characteristics- Bilateral- No Erythema or Congestion.  Size/Enlargement- Bilateral- No enlargement. Discharge- bilateral-None.  Neck Neck- Supple. No Masses.   Chest and Lung Exam Auscultation: Breath Sounds:- even and unlabored. Only faint upper lobe rhonchi  Cardiovascular Auscultation:Rythm- Regular, rate and rhythm. Murmurs & Other Heart Sounds:Ausculatation of the heart reveal- No Murmurs.  Lymphatic Head & Neck General Head & Neck Lymphatics: Bilateral: Description- No Localized lymphadenopathy.       Assessment & Plan:  You appear to have early sinus infection and bronchtis.  Will rx flonase for nasal congestion. For cough rx benzonatate. Also azithromycin antibiotic.  Since you have hx of copd and recent illness let us know if you have any wheezing.(declined offer for albuterol today)  Follow up 7 days or as needed  Zaide Mcclenahan, Percell Miller, [employer]

## 2015-11-17 NOTE — Patient Instructions (Signed)
You appear to have early sinus infection and bronchtis.  Will rx flonase for nasal congestion. For cough rx benzonatate. Also azithromycin antibiotic.  Since you have hx of copd and recent illness let us know if you have any wheezing.  Follow up 7 days or as needed

## 2015-11-17 NOTE — Progress Notes (Signed)
Pre visit review using our clinic review tool, if applicable. No additional management support is needed unless otherwise documented below in the visit note. 

## 2015-12-07 ENCOUNTER — Ambulatory Visit (INDEPENDENT_AMBULATORY_CARE_PROVIDER_SITE_OTHER): Payer: PPO | Admitting: Internal Medicine

## 2015-12-07 ENCOUNTER — Encounter: Payer: Self-pay | Admitting: Internal Medicine

## 2015-12-07 VITALS — BP 132/80 | HR 63 | Temp 97.7°F | Ht 71.0 in | Wt 178.0 lb

## 2015-12-07 DIAGNOSIS — Z87891 Personal history of nicotine dependence: Secondary | ICD-10-CM | POA: Diagnosis not present

## 2015-12-07 DIAGNOSIS — I10 Essential (primary) hypertension: Secondary | ICD-10-CM | POA: Diagnosis not present

## 2015-12-07 DIAGNOSIS — R399 Unspecified symptoms and signs involving the genitourinary system: Secondary | ICD-10-CM

## 2015-12-07 DIAGNOSIS — J410 Simple chronic bronchitis: Secondary | ICD-10-CM | POA: Diagnosis not present

## 2015-12-07 DIAGNOSIS — Z0000 Encounter for general adult medical examination without abnormal findings: Secondary | ICD-10-CM | POA: Diagnosis not present

## 2015-12-07 DIAGNOSIS — E785 Hyperlipidemia, unspecified: Secondary | ICD-10-CM | POA: Diagnosis not present

## 2015-12-07 DIAGNOSIS — Z09 Encounter for follow-up examination after completed treatment for conditions other than malignant neoplasm: Secondary | ICD-10-CM | POA: Insufficient documentation

## 2015-12-07 DIAGNOSIS — R739 Hyperglycemia, unspecified: Secondary | ICD-10-CM | POA: Diagnosis not present

## 2015-12-07 LAB — BASIC METABOLIC PANEL
BUN: 9 mg/dL (ref 6–23)
CALCIUM: 9.6 mg/dL (ref 8.4–10.5)
CHLORIDE: 103 meq/L (ref 96–112)
CO2: 32 mEq/L (ref 19–32)
CREATININE: 1 mg/dL (ref 0.40–1.50)
GFR: 78.04 mL/min (ref >60.00)
GLUCOSE: 100 mg/dL — AB (ref 70–99)
Potassium: 4.5 mEq/L (ref 3.5–5.1)
Sodium: 140 mEq/L (ref 135–145)

## 2015-12-07 LAB — ALT: ALT: 24 U/L (ref 0–53)

## 2015-12-07 LAB — LIPID PANEL
CHOL/HDL RATIO: 3
Cholesterol: 149 mg/dL (ref 0–200)
HDL: 50.3 mg/dL (ref >39.00)
LDL CALC: 75 mg/dL (ref 0–99)
NONHDL: 99.18
Triglycerides: 122 mg/dL (ref 0.0–149.0)
VLDL: 24.4 mg/dL (ref 0.0–40.0)

## 2015-12-07 LAB — AST: AST: 23 U/L (ref 0–37)

## 2015-12-07 LAB — HEMOGLOBIN A1C: HEMOGLOBIN A1C: 5.8 % (ref 4.6–6.5)

## 2015-12-07 LAB — TSH: TSH: 6.91 u[IU]/mL — ABNORMAL HIGH (ref 0.35–4.50)

## 2015-12-07 MED ORDER — FLUTICASONE PROPIONATE HFA 44 MCG/ACT IN AERO: 2.0000 | INHALATION_SPRAY | Freq: Two times a day (BID) | RESPIRATORY_TRACT | 12 refills | Status: DC

## 2015-12-07 MED ORDER — TAMSULOSIN HCL 0.4 MG PO CAPS: 0.4000 mg | ORAL_CAPSULE | Freq: Every day | ORAL | 3 refills | Status: DC

## 2015-12-07 NOTE — Assessment & Plan Note (Signed)
Hyperglycemia: Check A1c HTN: Continue Toprol. Ambulatory BPs within normal. Check a BMP Hyperlipidemia: Continue simvastatin, check a FLP, AST, ALT. L UTS: last year DRE- PSA normal, he continue with nocturia, he agreed to do a trial with Flomax. COPD: He agreed to do a trial with the Spiriva. Former smoker: s/p 2 ppd for > 40 years, quit 2006, discuss screening of lung cancer. He is interested. Will order. RTC 4 months

## 2015-12-07 NOTE — Assessment & Plan Note (Addendum)
Td --2010; pneumonia shot 12-08 ; prevnar 03-2014 ;  zostavax - ~ 2015   Cscope x 2 (Dr Inocente Salles) , last coloscopy 11-2008 negative, next in 10 years  DRE and PSA normal 2016 discussed diet and exercise (he is doing well, gym x3/week)

## 2015-12-07 NOTE — Progress Notes (Signed)
Subjective:    Patient ID: Kevin [name], male    DOB: 1945/08/04, 70 y.o.   MRN: [id number]  DOS:  12/07/2015 Type of visit - description : cpx Interval history: We also discussed other issues Hyperlipidemia: On simvastatin, good compliance without apparent side effects HTN: Good med compliance, ambulatory BPs always within normal COPD: Continue with cough spells, on and off throughout the day, + clear sputum, no wheezing. Goes to the gym, walks in the treadmill, no dyspnea on exertion. L UTS: Continue with nocturia and some difficulty urinating.   Review of Systems Constitutional: No fever. No chills. No unexplained wt changes. No unusual sweats  HEENT: No dental problems, no ear discharge, no facial swelling, no voice changes. No eye discharge, no eye  redness , no  intolerance to light   Respiratory:  See HPI  Cardiovascular: No CP, no leg swelling , no  Palpitations  GI: no nausea, no vomiting, no diarrhea , no  abdominal pain.  No blood in the stools. No dysphagia, no odynophagia    Endocrine: No polyphagia, no polyuria , no polydipsia  GU: No dysuria, gross hematuria  Musculoskeletal: No joint swellings or unusual aches or pains  Skin: No change in the color of the skin, palor , no  Rash  Allergic, immunologic: No environmental allergies , no  food allergies  Neurological: No dizziness no  syncope. No headaches. No diplopia, no slurred, no slurred speech, no motor deficits, no facial  Numbness  Hematological: No enlarged lymph nodes, no easy bruising , no unusual bleedings  Psychiatry: No suicidal ideas, no hallucinations, no beavior problems, no confusion.  No unusual/severe anxiety, no depression   Past Medical History:  Diagnosis Date  . COPD (chronic obstructive pulmonary disease) (HCC)    PFTs 01-2006 mild obstruction, (+) reponse to Bronchodilators    . Hyperlipidemia   . Hypertension   . Shingles    Thorax, 2008    Past Surgical History:    Procedure Laterality Date  . NO PAST SURGERIES      Social History   Social History  . Marital status: Married    Spouse name: N/A  . Number of children: 3  . Years of education: N/A   Occupational History  . retired 01-2013 [employer]   Social History Main Topics  . Smoking status: Former Smoker    Types: Cigarettes    Start date: 1960    Quit date: 05/13/2004  . Smokeless tobacco: Never Used     Comment: used to smoke 2 ppd   . Alcohol use 0.0 oz/week     Comment: 1-2 beers most days  . Drug use: No  . Sexual activity: Not on file   Other Topics Concern  . Not on file   Social History Narrative   Lives w/ wife   3 children     Family History  Problem Relation Age of Onset  . CAD Father     F MI ~ 38 y/o  . Hypertension Mother     M and S  . Colon cancer Neg Hx   . Prostate cancer Neg Hx   . Diabetes Neg Hx        Medication List       Accurate as of 12/07/15  6:02 PM. Always use your most recent med list.          aspirin 81 MG tablet Take 81 mg by mouth daily.   calcium carbonate 600 MG Tabs tablet Commonly  known as:  OS-CAL Take 600 mg by mouth daily with breakfast.   fluticasone 44 MCG/ACT inhaler Commonly known as:  FLOVENT HFA Inhale 2 puffs into the lungs 2 (two) times daily.   metoprolol succinate 50 MG 24 hr tablet Commonly known as:  TOPROL-XL Take 1 tablet (50 mg total) by mouth daily. Take with or immediately following a meal.   MULTIVITAMIN PO Take 1 tablet by mouth daily.   simvastatin 40 MG tablet Commonly known as:  ZOCOR Take 1 tablet (40 mg total) by mouth at bedtime.   tamsulosin 0.4 MG Caps capsule Commonly known as:  FLOMAX Take 1 capsule (0.4 mg total) by mouth daily.   VITAMIN B6 PO Take 1 tablet by mouth daily.          Objective:   Physical Exam BP 132/80 (BP Location: Left Arm, Patient Position: Sitting, Cuff Size: Normal)   Pulse 63   Temp 97.7 F (36.5 C) (Oral)   Ht 5\' 11"  (1.803 m)   Wt 178 lb  (80.7 kg)   SpO2 97%   BMI 24.83 kg/m   General:   Well developed, well nourished . NAD.  Neck: No  thyromegaly , normal carotid pulses HEENT:  Normocephalic . Face symmetric, atraumatic Lungs:  Decreased breath sounds Normal respiratory effort, no intercostal retractions, no accessory muscle use. Heart: RRR,  no murmur.  No pretibial edema bilaterally  Abdomen:  Not distended, soft, non-tender. No rebound or rigidity.   Skin: Exposed areas without rash. Not pale. Not jaundice Neurologic:  alert & oriented X3.  Speech normal, gait appropriate for age and unassisted Strength symmetric and appropriate for age.  Psych: Cognition and judgment appear intact.  Cooperative with normal attention span and concentration.  Behavior appropriate. No anxious or depressed appearing.    Assessment & Plan:   Assessment Hyperglycemia HTN Hyperlipidemia LUTS COPD: PFTs 2007 mild obstruction, + response to bronchodilators, h/o multiple inhalers intolerance  Shingles 2008, chest.  PLAN: Hyperglycemia: Check A1c HTN: Continue Toprol. Ambulatory BPs within normal. Check a BMP Hyperlipidemia: Continue simvastatin, check a FLP, AST, ALT. L UTS: last year DRE- PSA normal, he continue with nocturia, he agreed to do a trial with Flomax. COPD: He agreed to do a trial with the Spiriva. Former smoker: s/p 2 ppd for > 40 years, quit 2006, discuss screening of lung cancer. He is interested. Will order. RTC 4 months

## 2015-12-07 NOTE — Progress Notes (Signed)
Pre visit review using our clinic review tool, if applicable. No additional management support is needed unless otherwise documented below in the visit note. 

## 2015-12-07 NOTE — Patient Instructions (Signed)
Get your blood work before you leave   Start Flomax 1 tablet every night for your bladder problems  Start the inhaler 2 puffs twice a day  Come back in 3-4  months for a checkup   Fall Prevention and Home Safety Falls cause injuries and can affect all age groups. It is possible to use preventive measures to significantly decrease the likelihood of falls. There are many simple measures which can make your home safer and prevent falls. OUTDOORS  Repair cracks and edges of walkways and driveways.  Remove high doorway thresholds.  Trim shrubbery on the main path into your home.  Have good outside lighting.  Clear walkways of tools, rocks, debris, and clutter.  Check that handrails are not broken and are securely fastened. Both sides of steps should have handrails.  Have leaves, snow, and ice cleared regularly.  Use sand or salt on walkways during winter months.  In the garage, clean up grease or oil spills. BATHROOM  Install night lights.  Install grab bars by the toilet and in the tub and shower.  Use non-skid mats or decals in the tub or shower.  Place a plastic non-slip stool in the shower to sit on, if needed.  Keep floors dry and clean up all water on the floor immediately.  Remove soap buildup in the tub or shower on a regular basis.  Secure bath mats with non-slip, double-sided rug tape.  Remove throw rugs and tripping hazards from the floors. BEDROOMS  Install night lights.  Make sure a bedside light is easy to reach.  Do not use oversized bedding.  Keep a telephone by your bedside.  Have a firm chair with side arms to use for getting dressed.  Remove throw rugs and tripping hazards from the floor. KITCHEN  Keep handles on pots and pans turned toward the center of the stove. Use back burners when possible.  Clean up spills quickly and allow time for drying.  Avoid walking on wet floors.  Avoid hot utensils and knives.  Position shelves so they  are not too high or low.  Place commonly used objects within easy reach.  If necessary, use a sturdy step stool with a grab bar when reaching.  Keep electrical cables out of the way.  Do not use floor polish or wax that makes floors slippery. If you must use wax, use non-skid floor wax.  Remove throw rugs and tripping hazards from the floor. STAIRWAYS  Never leave objects on stairs.  Place handrails on both sides of stairways and use them. Fix any loose handrails. Make sure handrails on both sides of the stairways are as long as the stairs.  Check carpeting to make sure it is firmly attached along stairs. Make repairs to worn or loose carpet promptly.  Avoid placing throw rugs at the top or bottom of stairways, or properly secure the rug with carpet tape to prevent slippage. Get rid of throw rugs, if possible.  Have an electrician put in a light switch at the top and bottom of the stairs. OTHER FALL PREVENTION TIPS  Wear low-heel or rubber-soled shoes that are supportive and fit well. Wear closed toe shoes.  When using a stepladder, make sure it is fully opened and both spreaders are firmly locked. Do not climb a closed stepladder.  Add color or contrast paint or tape to grab bars and handrails in your home. Place contrasting color strips on first and last steps.  Learn and use mobility aids as  needed. Install an electrical emergency response system.  Turn on lights to avoid dark areas. Replace light bulbs that burn out immediately. Get light switches that glow.  Arrange furniture to create clear pathways. Keep furniture in the same place.  Firmly attach carpet with non-skid or double-sided tape.  Eliminate uneven floor surfaces.  Select a carpet pattern that does not visually hide the edge of steps.  Be aware of all pets. OTHER HOME SAFETY TIPS  Set the water temperature for 120 F (48.8 C).  Keep emergency numbers on or near the telephone.  Keep smoke detectors on  every level of the home and near sleeping areas. Document Released: 04/19/2002 Document Revised: 10/29/2011 Document Reviewed: 07/19/2011 [facility] Patient Information 2015 [place], [place]. This information is not intended to replace advice given to you by your health care provider. Make sure you discuss any questions you have with your health care provider.   Preventive Care for Adults Ages 87 and over  Blood pressure check.** / Every 1 to 2 years.  Lipid and cholesterol check.**/ Every 5 years beginning at age 80.  Lung cancer screening. / Every year if you are aged 31-80 years and have a 30-pack-year history of smoking and currently smoke or have quit within the past 15 years. Yearly screening is stopped once you have quit smoking for at least 15 years or develop a health problem that would prevent you from having lung cancer treatment.  Fecal occult blood test (FOBT) of stool. / Every year beginning at age 69 and continuing until age 3. You may not have to do this test if you get a colonoscopy every 10 years.  Flexible sigmoidoscopy** or colonoscopy.** / Every 5 years for a flexible sigmoidoscopy or every 10 years for a colonoscopy beginning at age 45 and continuing until age 58.  Hepatitis C blood test.** / For all people born from [birth year] through 1967 and any individual with known risks for hepatitis C.  Abdominal aortic aneurysm (AAA) screening.** / A one-time screening for ages 54 to 75 years who are current or former smokers.  Skin self-exam. / Monthly.  Influenza vaccine. / Every year.  Tetanus, diphtheria, and acellular pertussis (Tdap/Td) vaccine.** / 1 dose of Td every 10 years.  Varicella vaccine.** / Consult your health care provider.  Zoster vaccine.** / 1 dose for adults aged 1 years or older.  Pneumococcal 13-valent conjugate (PCV13) vaccine.** / Consult your health care provider.  Pneumococcal polysaccharide (PPSV23) vaccine.** / 1 dose for all adults aged 85 years  and older.  Meningococcal vaccine.** / Consult your health care provider.  Hepatitis A vaccine.** / Consult your health care provider.  Hepatitis B vaccine.** / Consult your health care provider.  Haemophilus influenzae type b (Hib) vaccine.** / Consult your health care provider. **Family history and personal history of risk and conditions may change your health care provider's recommendations. Document Released: 06/25/2001 Document Revised: 05/04/2013 Document Reviewed: 09/24/2010 [facility] Patient Information 2015 [place], [place]. This information is not intended to replace advice given to you by your health care provider. Make sure you discuss any questions you have with your health care provider.

## 2015-12-25 ENCOUNTER — Ambulatory Visit (HOSPITAL_BASED_OUTPATIENT_CLINIC_OR_DEPARTMENT_OTHER)
Admission: RE | Admit: 2015-12-25 | Discharge: 2015-12-25 | Disposition: A | Discharge status: Home / Self Care | Payer: PPO | Source: Ambulatory Visit | Attending: Internal Medicine | Admitting: Internal Medicine

## 2015-12-25 DIAGNOSIS — F17210 Nicotine dependence, cigarettes, uncomplicated: Secondary | ICD-10-CM | POA: Diagnosis not present

## 2015-12-25 DIAGNOSIS — I2510 Atherosclerotic heart disease of native coronary artery without angina pectoris: Secondary | ICD-10-CM | POA: Diagnosis not present

## 2015-12-25 DIAGNOSIS — Z87891 Personal history of nicotine dependence: Secondary | ICD-10-CM | POA: Insufficient documentation

## 2015-12-25 DIAGNOSIS — Z122 Encounter for screening for malignant neoplasm of respiratory organs: Secondary | ICD-10-CM | POA: Diagnosis not present

## 2015-12-25 DIAGNOSIS — I700 Atherosclerosis of aorta: Secondary | ICD-10-CM | POA: Diagnosis not present

## 2015-12-27 ENCOUNTER — Other Ambulatory Visit: Payer: Self-pay | Admitting: Internal Medicine

## 2016-01-01 ENCOUNTER — Other Ambulatory Visit: Payer: Self-pay | Admitting: Internal Medicine

## 2016-01-02 ENCOUNTER — Other Ambulatory Visit: Payer: Self-pay | Admitting: Internal Medicine

## 2016-01-03 ENCOUNTER — Other Ambulatory Visit: Payer: Self-pay | Admitting: Internal Medicine

## 2016-02-19 ENCOUNTER — Other Ambulatory Visit: Payer: Self-pay | Admitting: Internal Medicine

## 2016-03-11 ENCOUNTER — Ambulatory Visit (INDEPENDENT_AMBULATORY_CARE_PROVIDER_SITE_OTHER): Payer: PPO | Admitting: Internal Medicine

## 2016-03-11 ENCOUNTER — Encounter: Payer: Self-pay | Admitting: Internal Medicine

## 2016-03-11 VITALS — BP 118/78 | HR 58 | Temp 98.2°F | Resp 14 | Ht 71.0 in | Wt 182.2 lb

## 2016-03-11 DIAGNOSIS — I10 Essential (primary) hypertension: Secondary | ICD-10-CM

## 2016-03-11 DIAGNOSIS — Z23 Encounter for immunization: Secondary | ICD-10-CM

## 2016-03-11 DIAGNOSIS — E039 Hypothyroidism, unspecified: Secondary | ICD-10-CM

## 2016-03-11 DIAGNOSIS — E038 Other specified hypothyroidism: Secondary | ICD-10-CM

## 2016-03-11 DIAGNOSIS — E7849 Other hyperlipidemia: Secondary | ICD-10-CM

## 2016-03-11 DIAGNOSIS — J410 Simple chronic bronchitis: Secondary | ICD-10-CM | POA: Diagnosis not present

## 2016-03-11 DIAGNOSIS — E784 Other hyperlipidemia: Secondary | ICD-10-CM | POA: Diagnosis not present

## 2016-03-11 LAB — T4, FREE: Free T4: 0.61 ng/dL (ref 0.60–1.60)

## 2016-03-11 LAB — T3, FREE: T3 FREE: 3.7 pg/mL (ref 2.3–4.2)

## 2016-03-11 LAB — TSH: TSH: 6.02 u[IU]/mL — AB (ref 0.35–4.50)

## 2016-03-11 MED ORDER — CYCLOBENZAPRINE HCL 10 MG PO TABS: 10.0000 mg | ORAL_TABLET | Freq: Every evening | ORAL | 0 refills | Status: DC | PRN

## 2016-03-11 NOTE — Progress Notes (Signed)
Pre visit review using our clinic review tool, if applicable. No additional management support is needed unless otherwise documented below in the visit note. 

## 2016-03-11 NOTE — Progress Notes (Signed)
Subjective:    Patient ID: Kevin [name], male    DOB: [birth year], 70 y.o.   MRN: [id number]  DOS:  03/11/2016 Type of visit - description :  Routine follow-up Interval history: COPD: When he tried Flovent consistently develop a rash, now using it as needed only. L UTS: on Flomax, symptoms significantly decreased. Neck pain, right-sided, for one week, goes up to the nuchal area and to the trapezoid. No radiation to the arm, no paresthesias. No injury   Review of Systems Denies edema, constipation. Cough is at baseline, sporadic, no sputum production  Past Medical History:  Diagnosis Date  . COPD (chronic obstructive pulmonary disease) (HCC)    PFTs 01-2006 mild obstruction, (+) reponse to Bronchodilators    . Hyperlipidemia   . Hypertension   . Shingles    Thorax, 2008    Past Surgical History:  Procedure Laterality Date  . NO PAST SURGERIES      Social History   Social History  . Marital status: Married    Spouse name: N/A  . Number of children: 3  . Years of education: N/A   Occupational History  . retired 01-2013 [employer]   Social History Main Topics  . Smoking status: Former Smoker    Types: Cigarettes    Start date: 1960    Quit date: 05/13/2004  . Smokeless tobacco: Never Used     Comment: used to smoke 2 ppd   . Alcohol use 0.0 oz/week     Comment: 1-2 beers most days  . Drug use: No  . Sexual activity: Not on file   Other Topics Concern  . Not on file   Social History Narrative   Lives w/ wife   3 children        Medication List       Accurate as of 03/11/16 11:59 PM. Always use your most recent med list.          aspirin 81 MG tablet Take 81 mg by mouth daily.   calcium carbonate 600 MG Tabs tablet Commonly known as:  OS-CAL Take 600 mg by mouth daily with breakfast.   cyclobenzaprine 10 MG tablet Commonly known as:  FLEXERIL Take 1 tablet (10 mg total) by mouth at bedtime as needed for muscle spasms.   fluticasone 44 MCG/ACT  inhaler Commonly known as:  FLOVENT HFA Inhale 2 puffs into the lungs 2 (two) times daily.   metoprolol succinate 50 MG 24 hr tablet Commonly known as:  TOPROL-XL Take 1 tablet (50 mg total) by mouth daily. With or immediately following a meal.   MULTIVITAMIN PO Take 1 tablet by mouth daily.   simvastatin 40 MG tablet Commonly known as:  ZOCOR TAKE 1 TABLET BY MOUTH AT BEDTIME   tamsulosin 0.4 MG Caps capsule Commonly known as:  FLOMAX Take 1 capsule (0.4 mg total) by mouth daily.   VITAMIN B6 PO Take 1 tablet by mouth daily.          Objective:   Physical Exam BP 118/78 (BP Location: Left Arm, Patient Position: Sitting, Cuff Size: Normal)   Pulse (!) 58   Temp 98.2 F (36.8 C) (Oral)   Resp 14   Ht 5\' 11"  (1.803 m)   Wt 182 lb 4 oz (82.7 kg)   SpO2 97%   BMI 25.42 kg/m  General:   Well developed, well nourished . NAD.  HEENT:  Normocephalic . Face symmetric, atraumatic Neck: No TTP of the cervical spine, range of  motion is slightly limited in all directions due to mild pain. Lungs:  CTA B Normal respiratory effort, no intercostal retractions, no accessory muscle use. Heart: RRR,  no murmur.  No pretibial edema bilaterally  Skin: Not pale. Not jaundice Neurologic:  alert & oriented X3.  Speech normal, gait appropriate for age and unassisted. Motor symmetric. DTRs symmetric except for a slightly decreased bicipital jerk on the right Psych--  Cognition and judgment appear intact.  Cooperative with normal attention span and concentration.  Behavior appropriate. No anxious or depressed appearing.      Assessment & Plan:   Assessment Hyperglycemia HTN Hyperlipidemia LUTS COPD: PFTs 2007 mild obstruction, + response to bronchodilators, h/o multiple inhalers intolerance  Shingles 2008, chest.  PLAN: HTN: On Toprol, symptoms controlled Hyperlipidemia: On simvastatin, last FLP satisfactory L UTS: Better on Flomax COPD: Intolerant to multiple inhalers  and now also to Flovent, he is able to take it PRN only. Flu shot today. Neck pain: Rx a  Heating pad, Flexeril at night (pain is worse in the mornings), Tylenol. Call if no better. Low TSH-subclinical hypothyroidism: Check TFTs RTC 8 months for a checkup

## 2016-03-11 NOTE — Patient Instructions (Signed)
GO TO THE LAB : Get the blood work     GO TO THE FRONT DESK Schedule your next appointment for a  routine checkup in 8 months   For neck pain: Tylenol 500 mg one or 2 tablets every 8 hours as needed WARM compresses twice a day Flexeril, a muscle relaxant at night, watch for excessive somnolence Call if not improving in the next 2 weeks.

## 2016-03-12 NOTE — Assessment & Plan Note (Signed)
HTN: On Toprol, symptoms controlled Hyperlipidemia: On simvastatin, last FLP satisfactory L UTS: Better on Flomax COPD: Intolerant to multiple inhalers and now also to Flovent, he is able to take it PRN only. Flu shot today. Neck pain: Rx a  Heating pad, Flexeril at night (pain is worse in the mornings), Tylenol. Call if no better. Low TSH-subclinical hypothyroidism: Check TFTs RTC 8 months for a checkup

## 2016-05-17 ENCOUNTER — Other Ambulatory Visit: Payer: Self-pay | Admitting: Internal Medicine

## 2016-06-20 ENCOUNTER — Telehealth: Payer: Self-pay | Admitting: Internal Medicine

## 2016-06-23 ENCOUNTER — Other Ambulatory Visit: Payer: Self-pay | Admitting: Internal Medicine

## 2016-06-26 ENCOUNTER — Other Ambulatory Visit: Payer: Self-pay | Admitting: Internal Medicine

## 2016-06-27 ENCOUNTER — Other Ambulatory Visit: Payer: Self-pay | Admitting: Internal Medicine

## 2016-06-27 MED ORDER — METOPROLOL SUCCINATE ER 50 MG PO TB24: 50.0000 mg | ORAL_TABLET | Freq: Every day | ORAL | 1 refills | Status: DC

## 2016-06-27 NOTE — Telephone Encounter (Signed)
Patient called stating that [facility] did not receive the medication. Please advise  Phone: [phone]

## 2016-06-27 NOTE — Telephone Encounter (Signed)
Rx resent.

## 2016-06-27 NOTE — Addendum Note (Signed)
Addended byDamita Dunnings D on: 06/27/2016 12:45 PM   Modules accepted: Orders

## 2016-08-06 ENCOUNTER — Telehealth: Payer: Self-pay | Admitting: Internal Medicine

## 2016-08-06 NOTE — Telephone Encounter (Signed)
Left pt message asking to call Allison back directly at [phone] to schedule AWV. Thanks! °

## 2016-09-19 NOTE — Telephone Encounter (Signed)
Scheduled 12/10/16

## 2016-12-04 NOTE — Progress Notes (Signed)
Subjective:   Kevin [name] is a 71 y.o. male who presents for Medicare Annual/Subsequent preventive examination.  Review of Systems:  Cardiac Risk Factors include: advanced age (>45men, >21 women);male gender;dyslipidemia;hypertension No ROS.  Medicare Wellness Visit. Additional risk factors are reflected in the social history.  Sleep patterns: Restless after 5 hrs of sleep. Feels rested Home Safety/Smoke Alarms: Feels safe in home. Smoke alarms in place.  Living environment; residence and Firearm Safety: Lives with wife. 2 story home. Guns safely stored.  Seat Belt Safety/Bike Helmet: Wears seat belt.   Counseling:   Eye Exam- Wearing glasses. Eye doctor yearly. Dental- Dr.Jones every other year.   Male:   CCS-  Last 11/16/08: Moderate diverticulosis. Internal hemorrhoids. Otherwise normal. Recall 10 yrs.  PSA-  Lab Results  Component Value Date   PSA 0.73 10/11/2014   PSA 0.48 04/20/2013   PSA 0.41 01/24/2011        Objective:    Vitals: BP (!) 151/79 (BP Location: Right Arm, Cuff Size: Small)   Pulse (!) 59   Ht 5\' 11"  (1.803 m)   Wt 183 lb (83 kg)   SpO2 97%   BMI 25.52 kg/m   Body mass index is 25.52 kg/m.  Tobacco History  Smoking Status  . Former Smoker  . Types: Cigarettes  . Start date: 69  . Quit date: 05/13/2004  Smokeless Tobacco  . Never Used    Comment: used to smoke 2 ppd      Counseling given: Not Answered   Past Medical History:  Diagnosis Date  . COPD (chronic obstructive pulmonary disease) (HCC)    PFTs 01-2006 mild obstruction, (+) reponse to Bronchodilators    . Hyperlipidemia   . Hypertension   . Shingles    Thorax, 2008   Past Surgical History:  Procedure Laterality Date  . NO PAST SURGERIES     Family History  Problem Relation Age of Onset  . CAD Father        F MI ~ 43 y/o  . Hypertension Mother        M and S  . Colon cancer Neg Hx   . Prostate cancer Neg Hx   . Diabetes Neg Hx    History  Sexual Activity  .  Sexual activity: Not Currently    Outpatient Encounter Prescriptions as of 12/10/2016  Medication Sig  . aspirin 81 MG tablet Take 81 mg by mouth daily.  . calcium carbonate (OS-CAL) 600 MG TABS tablet Take 600 mg by mouth daily with breakfast.  . fluticasone (FLOVENT HFA) 44 MCG/ACT inhaler Inhale 2 puffs into the lungs 2 (two) times daily.  . metoprolol succinate (TOPROL-XL) 50 MG 24 hr tablet Take 1 tablet (50 mg total) by mouth daily. With or immediately following a meal.  . Multiple Vitamins-Minerals (MULTIVITAMIN PO) Take 1 tablet by mouth daily.  . simvastatin (ZOCOR) 40 MG tablet Take 1 tablet (40 mg total) by mouth at bedtime.  . cyclobenzaprine (FLEXERIL) 10 MG tablet Take 1 tablet (10 mg total) by mouth at bedtime as needed for muscle spasms. (Patient not taking: Reported on 12/10/2016)  . Pyridoxine HCl (VITAMIN B6 PO) Take 1 tablet by mouth daily.  . tamsulosin (FLOMAX) 0.4 MG CAPS capsule Take 1 capsule (0.4 mg total) by mouth daily. (Patient not taking: Reported on 12/10/2016)   No facility-administered encounter medications on file as of 12/10/2016.     Activities of Daily Living In your present state of health, do you have  any difficulty performing the following activities: 12/10/2016  Hearing? N  Vision? N  Difficulty concentrating or making decisions? Y  Comment Short term memory  Walking or climbing stairs? N  Comment "bad right knee"  Dressing or bathing? N  Doing errands, shopping? N  Preparing Food and eating ? N  Using the Toilet? N  In the past six months, have you accidently leaked urine? N  Do you have problems with loss of bowel control? N  Managing your Medications? N  Managing your Finances? N  Housekeeping or managing your Housekeeping? N  Some recent data might be hidden    Patient Care Team: Colon Branch, MD as PCP - General   Assessment:     Physical assessment deferred to PCP.  Exercise Activities and Dietary recommendations Current Exercise  Habits: Structured exercise class, Type of exercise: treadmill;strength training/weights, Time (Minutes): 60, Frequency (Times/Week): 3, Weekly Exercise (Minutes/Week): 180, Intensity: Moderate   Diet (meal preparation, eat out, water intake, caffeinated beverages, dairy products, fruits and vegetables): in general, a "healthy" diet   Breakfast: 1/2 bagel, 2 cups of coffee Lunch: skips Dinner: varies meat and vegetables Drinks a lot of water   Goals      Patient Stated   . Maintain current lifestyle (pt-stated)      Fall Risk Fall Risk  12/10/2016 12/07/2015 10/11/2014 04/20/2013  Falls in the past year? No No No No   Depression Screen PHQ 2/9 Scores 12/10/2016 12/07/2015 10/11/2014 04/20/2013  PHQ - 2 Score 0 0 0 0    Cognitive Function MMSE - Mini Mental State Exam 12/10/2016  Orientation to time 5  Orientation to Place 5  Registration 3  Attention/ Calculation 4  Recall 2  Language- name 2 objects 2  Language- repeat 1  Language- follow 3 step command 3  Language- read & follow direction 1  Write a sentence 1  Copy design 1  Total score 28        Immunization History  Administered Date(s) Administered  . Influenza Split 05/22/2012  . Influenza, High Dose Seasonal PF 02/10/2013, 03/11/2016  . Influenza,inj,Quad PF,36+ Mos 02/21/2014  . Pneumococcal Conjugate-13 03/23/2014  . Pneumococcal Polysaccharide-23 04/15/2007, 04/20/2013  . Td 05/13/1998, 08/01/2008  . Zoster 09/14/2013   Screening Tests Health Maintenance  Topic Date Due  . Hepatitis C Screening  [birth year]  . INFLUENZA VACCINE  12/11/2016  . TETANUS/TDAP  08/02/2018  . COLONOSCOPY  11/17/2018  . PNA vac Low Risk Adult  Completed      Plan:   Follow up with PCP today as scheduled.  Continue to eat heart healthy diet (full of fruits, vegetables, whole grains, lean protein, water--limit salt, fat, and sugar intake) and increase physical activity as tolerated.  Continue doing brain stimulating  activities (puzzles, reading, adult coloring books, staying active) to keep memory sharp.   I have personally reviewed and noted the following in the patient's chart:   . Medical and social history . Use of alcohol, tobacco or illicit drugs  . Current medications and supplements . Functional ability and status . Nutritional status . Physical activity . Advanced directives . List of other physicians . Hospitalizations, surgeries, and ER visits in previous 12 months . Vitals . Screenings to include cognitive, depression, and falls . Referrals and appointments  In addition, I have reviewed and discussed with patient certain preventive protocols, quality metrics, and best practice recommendations. A written personalized care plan for preventive services as well as general preventive health recommendations  were provided to patient.     Naaman Plummer [place], [place]  12/10/2016  Kathlene November, MD

## 2016-12-10 ENCOUNTER — Encounter: Payer: Self-pay | Admitting: Internal Medicine

## 2016-12-10 ENCOUNTER — Ambulatory Visit (INDEPENDENT_AMBULATORY_CARE_PROVIDER_SITE_OTHER): Payer: PPO | Admitting: Internal Medicine

## 2016-12-10 VITALS — BP 151/79 | HR 59 | Ht 71.0 in | Wt 183.0 lb

## 2016-12-10 DIAGNOSIS — Z0001 Encounter for general adult medical examination with abnormal findings: Secondary | ICD-10-CM

## 2016-12-10 DIAGNOSIS — J449 Chronic obstructive pulmonary disease, unspecified: Secondary | ICD-10-CM | POA: Diagnosis not present

## 2016-12-10 DIAGNOSIS — Z0000 Encounter for general adult medical examination without abnormal findings: Secondary | ICD-10-CM

## 2016-12-10 DIAGNOSIS — L989 Disorder of the skin and subcutaneous tissue, unspecified: Secondary | ICD-10-CM

## 2016-12-10 DIAGNOSIS — R739 Hyperglycemia, unspecified: Secondary | ICD-10-CM | POA: Diagnosis not present

## 2016-12-10 DIAGNOSIS — H6121 Impacted cerumen, right ear: Secondary | ICD-10-CM

## 2016-12-10 DIAGNOSIS — E039 Hypothyroidism, unspecified: Secondary | ICD-10-CM | POA: Diagnosis not present

## 2016-12-10 DIAGNOSIS — E785 Hyperlipidemia, unspecified: Secondary | ICD-10-CM | POA: Diagnosis not present

## 2016-12-10 DIAGNOSIS — I10 Essential (primary) hypertension: Secondary | ICD-10-CM | POA: Diagnosis not present

## 2016-12-10 DIAGNOSIS — E038 Other specified hypothyroidism: Secondary | ICD-10-CM | POA: Insufficient documentation

## 2016-12-10 LAB — COMPREHENSIVE METABOLIC PANEL
ALT: 19 U/L (ref 0–53)
AST: 17 U/L (ref 0–37)
Albumin: 4.3 g/dL (ref 3.5–5.2)
Alkaline Phosphatase: 47 U/L (ref 39–117)
BUN: 13 mg/dL (ref 6–23)
CHLORIDE: 101 meq/L (ref 96–112)
CO2: 31 meq/L (ref 19–32)
Calcium: 9.3 mg/dL (ref 8.4–10.5)
Creatinine, Ser: 0.93 mg/dL (ref 0.40–1.50)
GFR: 84.62 mL/min (ref >60.00)
Glucose, Bld: 94 mg/dL (ref 70–99)
Potassium: 4.6 mEq/L (ref 3.5–5.1)
SODIUM: 138 meq/L (ref 135–145)
TOTAL PROTEIN: 7 g/dL (ref 6.0–8.3)
Total Bilirubin: 0.5 mg/dL (ref 0.2–1.2)

## 2016-12-10 LAB — CBC WITH DIFFERENTIAL/PLATELET
Basophils Absolute: 0 10*3/uL (ref 0.0–0.1)
Basophils Relative: 0.7 % (ref 0.0–3.0)
EOS PCT: 3.8 % (ref 0.0–5.0)
Eosinophils Absolute: 0.2 10*3/uL (ref 0.0–0.7)
HCT: 44.1 % (ref 39.0–52.0)
HEMOGLOBIN: 14.7 g/dL (ref 13.0–17.0)
Lymphocytes Relative: 34.6 % (ref 12.0–46.0)
Lymphs Abs: 1.7 10*3/uL (ref 0.7–4.0)
MCHC: 33.4 g/dL (ref 30.0–36.0)
MCV: 96.4 fl (ref 78.0–100.0)
Monocytes Absolute: 0.4 10*3/uL (ref 0.1–1.0)
Monocytes Relative: 8 % (ref 3.0–12.0)
Neutro Abs: 2.6 10*3/uL (ref 1.4–7.7)
Neutrophils Relative %: 52.9 % (ref 43.0–77.0)
Platelets: 293 10*3/uL (ref 150.0–400.0)
RBC: 4.57 Mil/uL (ref 4.22–5.81)
RDW: 13 % (ref 11.5–15.5)
WBC: 4.9 10*3/uL (ref 4.0–10.5)

## 2016-12-10 LAB — LIPID PANEL
CHOL/HDL RATIO: 3
Cholesterol: 142 mg/dL (ref 0–200)
HDL: 48.5 mg/dL (ref >39.00)
LDL CALC: 67 mg/dL (ref 0–99)
NonHDL: 93.05
Triglycerides: 129 mg/dL (ref 0.0–149.0)
VLDL: 25.8 mg/dL (ref 0.0–40.0)

## 2016-12-10 LAB — T4, FREE: Free T4: 0.79 ng/dL (ref 0.60–1.60)

## 2016-12-10 LAB — PSA: PSA: 1.01 ng/mL (ref 0.10–4.00)

## 2016-12-10 LAB — T3, FREE: T3 FREE: 3.2 pg/mL (ref 2.3–4.2)

## 2016-12-10 LAB — TSH: TSH: 11.48 u[IU]/mL — ABNORMAL HIGH (ref 0.35–4.50)

## 2016-12-10 NOTE — Patient Instructions (Addendum)
GO TO THE LAB : Get the blood work     GO TO THE FRONT DESK Schedule your next appointment for a  checkup in 6 months   Consider a shot called SHINGREX   Check the  blood pressure  weekly  Be sure your blood pressure is between 110/65 and  145/85. If it is consistently higher or lower, let me know   [name] , Thank you for taking time to come for your Medicare Wellness Visit. I appreciate your ongoing commitment to your health goals. Please review the following plan we discussed and let me know if I can assist you in the future.   These are the goals we discussed: Goals      Patient Stated   . Maintain current lifestyle (pt-stated)       This is a list of the screening recommended for you and due dates:  Health Maintenance  Topic Date Due  .  Hepatitis C: One time screening is recommended by Center for Disease Control  (CDC) for  adults born from [birth year] through 1967.   [birth year]  . Flu Shot  12/11/2016  . Tetanus Vaccine  08/02/2018  . Colon Cancer Screening  11/17/2018  . Pneumonia vaccines  Completed   Continue to eat heart healthy diet (full of fruits, vegetables, whole grains, lean protein, water--limit salt, fat, and sugar intake) and increase physical activity as tolerated.  Continue doing brain stimulating activities (puzzles, reading, adult coloring books, staying active) to keep memory sharp.    Health Maintenance, Male A healthy lifestyle and preventive care is important for your health and wellness. Ask your health care provider about what schedule of regular examinations is right for you. What should I know about weight and diet? Eat a Healthy Diet  Eat plenty of vegetables, fruits, whole grains, low-fat dairy products, and lean protein.  Do not eat a lot of foods high in solid fats, added sugars, or salt.  Maintain a Healthy Weight Regular exercise can help you achieve or maintain a healthy weight. You should:  Do at least 150 minutes of exercise each  week. The exercise should increase your heart rate and make you sweat (moderate-intensity exercise).  Do strength-training exercises at least twice a week.  Watch Your Levels of Cholesterol and Blood Lipids  Have your blood tested for lipids and cholesterol every 5 years starting at 73 years of age. If you are at high risk for heart disease, you should start having your blood tested when you are 73 years old. You may need to have your cholesterol levels checked more often if: ? Your lipid or cholesterol levels are high. ? You are older than 73 years of age. ? You are at high risk for heart disease.  What should I know about cancer screening? Many types of cancers can be detected early and may often be prevented. Lung Cancer  You should be screened every year for lung cancer if: ? You are a current smoker who has smoked for at least 30 years. ? You are a former smoker who has quit within the past 15 years.  Talk to your health care provider about your screening options, when you should start screening, and how often you should be screened.  Colorectal Cancer  Routine colorectal cancer screening usually begins at 73 years of age and should be repeated every 5-10 years until you are 73 years old. You may need to be screened more often if early forms of precancerous polyps  or small growths are found. Your health care provider may recommend screening at an earlier age if you have risk factors for colon cancer.  Your health care provider may recommend using home test kits to check for hidden blood in the stool.  A small camera at the end of a tube can be used to examine your colon (sigmoidoscopy or colonoscopy). This checks for the earliest forms of colorectal cancer.  Prostate and Testicular Cancer  Depending on your age and overall health, your health care provider may do certain tests to screen for prostate and testicular cancer.  Talk to your health care provider about any symptoms or  concerns you have about testicular or prostate cancer.  Skin Cancer  Check your skin from head to toe regularly.  Tell your health care provider about any new moles or changes in moles, especially if: ? There is a change in a mole's size, shape, or color. ? You have a mole that is larger than a pencil eraser.  Always use sunscreen. Apply sunscreen liberally and repeat throughout the day.  Protect yourself by wearing long sleeves, pants, a wide-brimmed hat, and sunglasses when outside.  What should I know about heart disease, diabetes, and high blood pressure?  If you are 50-33 years of age, have your blood pressure checked every 3-5 years. If you are 36 years of age or older, have your blood pressure checked every year. You should have your blood pressure measured twice-once when you are at a hospital or clinic, and once when you are not at a hospital or clinic. Record the average of the two measurements. To check your blood pressure when you are not at a hospital or clinic, you can use: ? An automated blood pressure machine at a pharmacy. ? A home blood pressure monitor.  Talk to your health care provider about your target blood pressure.  If you are between 62-64 years old, ask your health care provider if you should take aspirin to prevent heart disease.  Have regular diabetes screenings by checking your fasting blood sugar level. ? If you are at a normal weight and have a low risk for diabetes, have this test once every three years after the age of 11. ? If you are overweight and have a high risk for diabetes, consider being tested at a younger age or more often.  A one-time screening for abdominal aortic aneurysm (AAA) by ultrasound is recommended for men aged 47-75 years who are current or former smokers. What should I know about preventing infection? Hepatitis B If you have a higher risk for hepatitis B, you should be screened for this virus. Talk with your health care provider  to find out if you are at risk for hepatitis B infection. Hepatitis C Blood testing is recommended for:  Everyone born from [birth year] through 1967.  Anyone with known risk factors for hepatitis C.  Sexually Transmitted Diseases (STDs)  You should be screened each year for STDs including gonorrhea and chlamydia if: ? You are sexually active and are younger than 73 years of age. ? You are older than 73 years of age and your health care provider tells you that you are at risk for this type of infection. ? Your sexual activity has changed since you were last screened and you are at an increased risk for chlamydia or gonorrhea. Ask your health care provider if you are at risk.  Talk with your health care provider about whether you are at high risk  of being infected with HIV. Your health care provider may recommend a prescription medicine to help prevent HIV infection.  What else can I do?  Schedule regular health, dental, and eye exams.  Stay current with your vaccines (immunizations).  Do not use any tobacco products, such as cigarettes, chewing tobacco, and e-cigarettes. If you need help quitting, ask your health care provider.  Limit alcohol intake to no more than 2 drinks per day. One drink equals 12 ounces of beer, 5 ounces of wine, or 1 ounces of hard liquor.  Do not use street drugs.  Do not share needles.  Ask your health care provider for help if you need support or information about quitting drugs.  Tell your health care provider if you often feel depressed.  Tell your health care provider if you have ever been abused or do not feel safe at home. This information is not intended to replace advice given to you by your health care provider. Make sure you discuss any questions you have with your health care provider. Document Released: 10/26/2007 Document Revised: 12/27/2015 Document Reviewed: 01/31/2015 Elsevier Interactive Patient Education  [employer].

## 2016-12-10 NOTE — Assessment & Plan Note (Addendum)
-  Td:2010; pneumonia shot 12-08 ; prevnar 03-2014 ;  zostavax :2015 ; shingrex discussed -Cscope x 2 (Dr Inocente Salles) , last coloscopy 11-2008 negative, next in 10 years -DRE normal, check a PSA -Labs: CMP, FLP, CBC, TFTs, PSA  -discussed diet and exercise (he is doing well, gym x3/week)

## 2016-12-10 NOTE — Progress Notes (Signed)
Subjective:    Patient ID: Kevin [name], male    DOB: [birth year], 71 y.o.   MRN: [id number]  DOS:  12/10/2016 Type of visit - description : CPX Interval history:  Here for CPX, we also discussed other issues BP elevated today, at home is checked several times a month and is normal. LUTS: Currently with minimal symptoms, stopped Flomax. Has a skin lesion, left arm. Several days history of right ear feeling clogged. Denies any pain or discharge. Increase TSH: Denies constipation, weight gain, depression or fatigue.    Review of Systems   Other than above, a 14 point review of systems is negative     Past Medical History:  Diagnosis Date  . COPD (chronic obstructive pulmonary disease) (HCC)    PFTs 01-2006 mild obstruction, (+) reponse to Bronchodilators    . Hyperlipidemia   . Hypertension   . Shingles    Thorax, 2008    Past Surgical History:  Procedure Laterality Date  . NO PAST SURGERIES      Social History   Social History  . Marital status: Married    Spouse name: N/A  . Number of children: 3  . Years of education: N/A   Occupational History  . retired 01-2013 [employer]   Social History Main Topics  . Smoking status: Former Smoker    Types: Cigarettes    Start date: 1960    Quit date: 05/13/2004  . Smokeless tobacco: Never Used     Comment: used to smoke 2 ppd   . Alcohol use 0.0 oz/week     Comment: 1-2 beers most days  . Drug use: No  . Sexual activity: Not Currently   Other Topics Concern  . Not on file   Social History Narrative   Lives w/ wife   3 children     Family History  Problem Relation Age of Onset  . CAD Father        F MI ~ 34 y/o  . Hypertension Mother        M and S  . Colon cancer Neg Hx   . Prostate cancer Neg Hx   . Diabetes Neg Hx      Allergies as of 12/10/2016      Reactions   Tiotropium Bromide Monohydrate Hives      Medication List       Accurate as of 12/10/16 11:59 PM. Always use your most recent med list.            aspirin 81 MG tablet Take 81 mg by mouth daily.   calcium carbonate 600 MG Tabs tablet Commonly known as:  OS-CAL Take 600 mg by mouth daily with breakfast.   metoprolol succinate 50 MG 24 hr tablet Commonly known as:  TOPROL-XL Take 1 tablet (50 mg total) by mouth daily. With or immediately following a meal.   MULTIVITAMIN PO Take 1 tablet by mouth daily.   simvastatin 40 MG tablet Commonly known as:  ZOCOR Take 1 tablet (40 mg total) by mouth at bedtime.   VITAMIN B6 PO Take 1 tablet by mouth daily.          Objective:   Physical Exam BP (!) 151/79 (BP Location: Right Arm, Cuff Size: Small)   Pulse (!) 59   Ht 5\' 11"  (1.803 m)   Wt 183 lb (83 kg)   SpO2 97%   BMI 25.52 kg/m   General:   Well developed, well nourished . NAD.  Neck: No  thyromegaly  HEENT:  Normocephalic . Face symmetric, atraumatic. Left TM normal, minimal wax. Right ear: + Cerumen impaction, partially removed with a spoon. After the lavage, right ear was completely normal Lungs:  CTA B Normal respiratory effort, no intercostal retractions, no accessory muscle use. Heart: RRR,  no murmur.  No pretibial edema bilaterally  Abdomen:  Not distended, soft, non-tender. No rebound or rigidity.   Skin: Has a corneal skin lesion at the left forearm Rectal:  External abnormalities: none. Normal sphincter tone. No rectal masses or tenderness.  Stool brown  Prostate: Prostate gland firm and smooth, no enlargement, nodularity, tenderness, mass, asymmetry or induration.  Neurologic:  alert & oriented X3.  Speech normal, gait appropriate for age and unassisted Strength symmetric and appropriate for age.  Psych: Cognition and judgment appear intact.  Cooperative with normal attention span and concentration.  Behavior appropriate. No anxious or depressed appearing.    Assessment & Plan:  Assessment Hyperglycemia HTN Hyperlipidemia LUTS COPD: PFTs 2007 mild obstruction, + response  to bronchodilators, h/o multiple inhalers intolerance  Shingles 2008, chest.  PLAN: HTN: BP today elevated, when he checks at home or at the [facility], BPs are  consistently normal. No change. Hyperglycemia: A1c stable the last time we checked. Hyperlipidemia: On simvastatin, check labs L UTS: Self stopped Flomax, currently with minimal sx , states no need for medication. COPD: Multiple inhalers intolerances, uses a inhaler on average once a week (Flovent?) Cerumen impaction. S/p lavage Skin lesion: Refer to dermatology Subclinical hypothyroidism: Check TFTs RTC 6 months

## 2016-12-11 NOTE — Assessment & Plan Note (Signed)
PLAN: HTN: BP today elevated, when he checks at home or at the [facility], BPs are  consistently normal. No change. Hyperglycemia: A1c stable the last time we checked. Hyperlipidemia: On simvastatin, check labs L UTS: Self stopped Flomax, currently with minimal sx , states no need for medication. COPD: Multiple inhalers intolerances, uses a inhaler on average once a week (Flovent?) Cerumen impaction. S/p lavage Skin lesion: Refer to dermatology Subclinical hypothyroidism: Check TFTs RTC 6 months

## 2016-12-16 MED ORDER — LEVOTHYROXINE SODIUM 25 MCG PO TABS: 25.0000 ug | ORAL_TABLET | Freq: Every day | ORAL | 2 refills | Status: DC

## 2016-12-16 NOTE — Addendum Note (Signed)
Addended by: Damita Dunnings D on: 12/16/2016 11:21 AM   Modules accepted: Orders

## 2017-01-22 DIAGNOSIS — L219 Seborrheic dermatitis, unspecified: Secondary | ICD-10-CM | POA: Diagnosis not present

## 2017-01-22 DIAGNOSIS — L570 Actinic keratosis: Secondary | ICD-10-CM | POA: Diagnosis not present

## 2017-01-22 DIAGNOSIS — Z23 Encounter for immunization: Secondary | ICD-10-CM | POA: Diagnosis not present

## 2017-01-22 DIAGNOSIS — L821 Other seborrheic keratosis: Secondary | ICD-10-CM | POA: Diagnosis not present

## 2017-01-27 ENCOUNTER — Other Ambulatory Visit (INDEPENDENT_AMBULATORY_CARE_PROVIDER_SITE_OTHER): Payer: PPO

## 2017-01-27 DIAGNOSIS — E038 Other specified hypothyroidism: Secondary | ICD-10-CM

## 2017-01-27 DIAGNOSIS — E039 Hypothyroidism, unspecified: Secondary | ICD-10-CM | POA: Diagnosis not present

## 2017-01-27 LAB — TSH: TSH: 8.54 u[IU]/mL — AB (ref 0.35–4.50)

## 2017-01-30 MED ORDER — LEVOTHYROXINE SODIUM 75 MCG PO TABS: 75.0000 ug | ORAL_TABLET | Freq: Every day | ORAL | 2 refills | Status: DC

## 2017-01-30 NOTE — Addendum Note (Signed)
Addended byDamita Dunnings D on: 01/30/2017 01:31 PM   Modules accepted: Orders

## 2017-02-13 ENCOUNTER — Other Ambulatory Visit: Payer: Self-pay | Admitting: Internal Medicine

## 2017-04-26 ENCOUNTER — Telehealth: Payer: Self-pay | Admitting: Internal Medicine

## 2017-04-26 NOTE — Telephone Encounter (Signed)
Patient is overdue for recheck TSH, please arrange

## 2017-04-28 NOTE — Telephone Encounter (Signed)
Letter printed and mailed to Pt- instructing to call office to schedule lab appt to recheck TSH.

## 2017-04-28 NOTE — Telephone Encounter (Signed)
6 month follow-up scheduled 06/12/2017- is this okay?

## 2017-04-28 NOTE — Telephone Encounter (Signed)
Needs labs sooner

## 2017-05-15 ENCOUNTER — Other Ambulatory Visit: Payer: Self-pay | Admitting: Internal Medicine

## 2017-05-19 ENCOUNTER — Other Ambulatory Visit (INDEPENDENT_AMBULATORY_CARE_PROVIDER_SITE_OTHER): Payer: PPO

## 2017-05-19 ENCOUNTER — Other Ambulatory Visit: Payer: PPO

## 2017-05-19 DIAGNOSIS — E038 Other specified hypothyroidism: Secondary | ICD-10-CM

## 2017-05-19 DIAGNOSIS — E039 Hypothyroidism, unspecified: Secondary | ICD-10-CM | POA: Diagnosis not present

## 2017-05-19 LAB — TSH: TSH: 2.6 u[IU]/mL (ref 0.35–4.50)

## 2017-05-22 MED ORDER — LEVOTHYROXINE SODIUM 75 MCG PO TABS: 75.0000 ug | ORAL_TABLET | Freq: Every day | ORAL | 6 refills | Status: DC

## 2017-05-22 NOTE — Addendum Note (Signed)
Addended byDamita Dunnings D on: 05/22/2017 07:40 AM   Modules accepted: Orders

## 2017-06-12 ENCOUNTER — Ambulatory Visit: Payer: PPO | Admitting: Internal Medicine

## 2017-06-15 ENCOUNTER — Other Ambulatory Visit: Payer: Self-pay | Admitting: Internal Medicine

## 2017-08-10 ENCOUNTER — Other Ambulatory Visit: Payer: Self-pay | Admitting: Internal Medicine

## 2017-09-30 ENCOUNTER — Ambulatory Visit (INDEPENDENT_AMBULATORY_CARE_PROVIDER_SITE_OTHER): Payer: PPO | Admitting: Internal Medicine

## 2017-09-30 ENCOUNTER — Encounter: Payer: Self-pay | Admitting: Internal Medicine

## 2017-09-30 VITALS — BP 132/68 | HR 61 | Temp 97.5°F | Resp 16 | Ht 71.0 in | Wt 187.0 lb

## 2017-09-30 DIAGNOSIS — L259 Unspecified contact dermatitis, unspecified cause: Secondary | ICD-10-CM

## 2017-09-30 NOTE — Patient Instructions (Signed)
Use the hydrocortisone cream twice a day until better  Call if not improving in few days

## 2017-09-30 NOTE — Progress Notes (Signed)
Pre visit review using our clinic review tool, if applicable. No additional management support is needed unless otherwise documented below in the visit note. 

## 2017-09-30 NOTE — Progress Notes (Signed)
Subjective:    Patient ID: Kevin [name], male    DOB: 1946/01/13, 72 y.o.   MRN: [id number]  DOS:  09/30/2017 Type of visit - description : acute Interval history: 3 days ago he did some work on his yard and was exposed to a number of plants. The next day he had an itchy rash on the arms, it was somewhat painful. He is using hydrocortisone cream that was rx to his wife.  Rash is somewhat better.   Review of Systems  Denies any fever.  No chills. No tick bites that he can tell No blisters. Past Medical History:  Diagnosis Date  . COPD (chronic obstructive pulmonary disease) (HCC)    PFTs 01-2006 mild obstruction, (+) reponse to Bronchodilators    . Hyperlipidemia   . Hypertension   . Shingles    Thorax, 2008    Past Surgical History:  Procedure Laterality Date  . NO PAST SURGERIES      Social History   Socioeconomic History  . Marital status: Married    Spouse name: Not on file  . Number of children: 3  . Years of education: Not on file  . Highest education level: Not on file  Occupational History  . Occupation: retired 01-2013    Employer: [employer]  Social Needs  . Financial resource strain: Not on file  . Food insecurity:    Worry: Not on file    Inability: Not on file  . Transportation needs:    Medical: Not on file    Non-medical: Not on file  Tobacco Use  . Smoking status: Former Smoker    Types: Cigarettes    Start date: 1960    Last attempt to quit: 05/13/2004    Years since quitting: 13.3  . Smokeless tobacco: Never Used  . Tobacco comment: used to smoke 2 ppd   Substance and Sexual Activity  . Alcohol use: Yes    Alcohol/week: 0.0 oz    Comment: 1-2 beers most days  . Drug use: No  . Sexual activity: Not Currently  Lifestyle  . Physical activity:    Days per week: Not on file    Minutes per session: Not on file  . Stress: Not on file  Relationships  . Social connections:    Talks on phone: Not on file    Gets together: Not on file   Attends religious service: Not on file    Active member of club or organization: Not on file    Attends meetings of clubs or organizations: Not on file    Relationship status: Not on file  . Intimate partner violence:    Fear of current or ex partner: Not on file    Emotionally abused: Not on file    Physically abused: Not on file    Forced sexual activity: Not on file  Other Topics Concern  . Not on file  Social History Narrative   Lives w/ wife   3 children      Allergies as of 09/30/2017      Reactions   Tiotropium Bromide Monohydrate Hives      Medication List        Accurate as of 09/30/17 11:59 PM. Always use your most recent med list.          aspirin 81 MG tablet Take 81 mg by mouth daily.   calcium carbonate 600 MG Tabs tablet Commonly known as:  OS-CAL Take 600 mg by mouth daily with breakfast.  levothyroxine 75 MCG tablet Commonly known as:  SYNTHROID, LEVOTHROID Take 1 tablet (75 mcg total) by mouth daily before breakfast.   metoprolol succinate 50 MG 24 hr tablet Commonly known as:  TOPROL-XL Take 1 tablet (50 mg total) by mouth daily. Take with or immediately following a meal.   MULTIVITAMIN PO Take 1 tablet by mouth daily.   simvastatin 40 MG tablet Commonly known as:  ZOCOR Take 1 tablet (40 mg total) by mouth at bedtime.   VITAMIN B6 PO Take 1 tablet by mouth daily.          Objective:   Physical Exam  Skin:      BP 132/68 (BP Location: Left Arm, Patient Position: Sitting, Cuff Size: Small)   Pulse 61   Temp (!) 97.5 F (36.4 C) (Oral)   Resp 16   Ht 5\' 11"  (1.803 m)   Wt 187 lb (84.8 kg)   SpO2 97%   BMI 26.08 kg/m  General:   Well developed, well nourished . NAD.  HEENT:  Normocephalic . Face symmetric, atraumatic Skin: See graphic Neurologic:  alert & oriented X3.  Speech normal, gait appropriate for age and unassisted Psych--  Cognition and judgment appear intact.  Cooperative with normal attention span and  concentration.  Behavior appropriate. No anxious or depressed appearing.      Assessment & Plan:   Assessment Hyperglycemia HTN Hyperlipidemia LUTS COPD: PFTs 2007 mild obstruction, + response to bronchodilators, h/o multiple inhalers intolerance  Shingles 2008, chest.  PLAN: Contact dermatitis: Likely has contact dermatitis, no evidence of cellulitis (it is already getting better), insect bites or shingles.  Rec to continue using the Rx strength  hydrocortisone which he has at home, call if not gradually improving. RTC 8- 2019 as a scheduled

## 2017-10-01 NOTE — Assessment & Plan Note (Signed)
Contact dermatitis: Likely has contact dermatitis, no evidence of cellulitis (it is already getting better), insect bites or shingles.  Rec to continue using the Rx strength  hydrocortisone which he has at home, call if not gradually improving. RTC 8- 2019 as a scheduled

## 2017-10-04 ENCOUNTER — Other Ambulatory Visit: Payer: Self-pay | Admitting: Internal Medicine

## 2017-11-27 ENCOUNTER — Other Ambulatory Visit: Payer: Self-pay | Admitting: Internal Medicine

## 2017-12-04 NOTE — Progress Notes (Addendum)
Subjective:   Kevin [name] is a 72 y.o. male who presents for Medicare Annual/Subsequent preventive examination.  Review of Systems: No ROS.  Medicare Wellness Visit. Additional risk factors are reflected in the social history.  Cardiac Risk Factors include: advanced age (>83men, >51 women);dyslipidemia;hypertension;male gender Sleep patterns: wakes 2-3 x to urinate. Does not sleep well.   Home Safety/Smoke Alarms: Feels safe in home. Smoke alarms in place.  Living environment; residence and Firearm Safety: Lives with wife in 2 story. Step over tub. Eye-every 2 yrs. Wearing glasses.  Male:   CCS-  Due 11/17/18   PSA-  Lab Results  Component Value Date   PSA 1.01 12/10/2016   PSA 0.73 10/11/2014   PSA 0.48 04/20/2013       Objective:    Vitals: BP (!) 142/80 (BP Location: Left Arm, Patient Position: Sitting, Cuff Size: Normal)   Pulse (!) 58   Ht 5\' 11"  (1.803 m)   Wt 187 lb 6.4 oz (85 kg)   SpO2 96%   BMI 26.14 kg/m   Body mass index is 26.14 kg/m.  Advanced Directives 12/12/2017 12/10/2016  Does Patient Have a Medical Advance Directive? No No  Would patient like information on creating a medical advance directive? No - Patient declined Yes (MAU/Ambulatory/Procedural Areas - Information given)    Tobacco Social History   Tobacco Use  Smoking Status Former Smoker  . Types: Cigarettes  . Start date: 6  . Last attempt to quit: 05/13/2004  . Years since quitting: 13.5  Smokeless Tobacco Never Used  Tobacco Comment   used to smoke 2 ppd      Counseling given: Not Answered Comment: used to smoke 2 ppd    Clinical Intake: Pain : No/denies pain      Past Medical History:  Diagnosis Date  . COPD (chronic obstructive pulmonary disease) (HCC)    PFTs 01-2006 mild obstruction, (+) reponse to Bronchodilators    . Hyperlipidemia   . Hypertension   . Shingles    Thorax, 2008   Past Surgical History:  Procedure Laterality Date  . NO PAST SURGERIES      Family History  Problem Relation Age of Onset  . CAD Father        F MI ~ 24 y/o  . Hypertension Mother        M and S  . Colon cancer Neg Hx   . Prostate cancer Neg Hx   . Diabetes Neg Hx    Social History   Socioeconomic History  . Marital status: Married    Spouse name: Not on file  . Number of children: 3  . Years of education: Not on file  . Highest education level: Not on file  Occupational History  . Occupation: retired 01-2013    Employer: [employer]  Social Needs  . Financial resource strain: Not on file  . Food insecurity:    Worry: Not on file    Inability: Not on file  . Transportation needs:    Medical: Not on file    Non-medical: Not on file  Tobacco Use  . Smoking status: Former Smoker    Types: Cigarettes    Start date: 1960    Last attempt to quit: 05/13/2004    Years since quitting: 13.5  . Smokeless tobacco: Never Used  . Tobacco comment: used to smoke 2 ppd   Substance and Sexual Activity  . Alcohol use: Yes    Alcohol/week: 0.0 oz    Comment:  1-2 beers most days  . Drug use: No  . Sexual activity: Not Currently  Lifestyle  . Physical activity:    Days per week: Not on file    Minutes per session: Not on file  . Stress: Not on file  Relationships  . Social connections:    Talks on phone: Not on file    Gets together: Not on file    Attends religious service: Not on file    Active member of club or organization: Not on file    Attends meetings of clubs or organizations: Not on file    Relationship status: Not on file  Other Topics Concern  . Not on file  Social History Narrative   Lives w/ wife   3 children    Outpatient Encounter Medications as of 12/12/2017  Medication Sig  . aspirin 81 MG tablet Take 81 mg by mouth daily.  . calcium carbonate (OS-CAL) 600 MG TABS tablet Take 600 mg by mouth daily with breakfast.  . fluticasone (FLOVENT HFA) 44 MCG/ACT inhaler Inhale 2 puffs into the lungs 2 (two) times daily.  Marland Kitchen levothyroxine  (SYNTHROID, LEVOTHROID) 75 MCG tablet Take 1 tablet (75 mcg total) by mouth daily before breakfast.  . metoprolol succinate (TOPROL-XL) 50 MG 24 hr tablet Take 1 tablet (50 mg total) by mouth daily. Take with or immediately following a meal.  . Multiple Vitamins-Minerals (MULTIVITAMIN PO) Take 1 tablet by mouth daily.  . simvastatin (ZOCOR) 40 MG tablet Take 1 tablet (40 mg total) by mouth at bedtime.  . [DISCONTINUED] Pyridoxine HCl (VITAMIN B6 PO) Take 1 tablet by mouth daily.   No facility-administered encounter medications on file as of 12/12/2017.     Activities of Daily Living In your present state of health, do you have any difficulty performing the following activities: 12/12/2017  Hearing? N  Vision? N  Difficulty concentrating or making decisions? N  Walking or climbing stairs? N  Dressing or bathing? N  Doing errands, shopping? N  Preparing Food and eating ? N  Using the Toilet? N  In the past six months, have you accidently leaked urine? N  Do you have problems with loss of bowel control? N  Managing your Medications? N  Managing your Finances? N  Housekeeping or managing your Housekeeping? N  Some recent data might be hidden    Patient Care Team: Colon Branch, MD as PCP - General   Assessment:   This is a routine wellness examination for Claris. Physical assessment deferred to PCP.  Exercise Activities and Dietary recommendations Current Exercise Habits: Home exercise routine, Type of exercise: treadmill;strength training/weights, Time (Minutes): 60, Frequency (Times/Week): 3, Weekly Exercise (Minutes/Week): 180, Intensity: Mild, Exercise limited by: None identified   Diet (meal preparation, eat out, water intake, caffeinated beverages, dairy products, fruits and vegetables): 24 hour recall Breakfast:skips mostly Lunch: skips Dinner:  Pork, vegetables. Drinks 3 bottles of water per day.  Goals    . Maintain current lifestyle.       Fall Risk Fall Risk  12/12/2017  12/10/2016 12/07/2015 10/11/2014 04/20/2013  Falls in the past year? No No No No No   Depression Screen PHQ 2/9 Scores 12/12/2017 12/10/2016 12/07/2015 10/11/2014  PHQ - 2 Score 0 0 0 0    Cognitive Function MMSE - Mini Mental State Exam 12/10/2016  Orientation to time 5  Orientation to Place 5  Registration 3  Attention/ Calculation 4  Recall 2  Language- name 2 objects 2  Language- repeat  1  Language- follow 3 step command 3  Language- read & follow direction 1  Write a sentence 1  Copy design 1  Total score 28        Immunization History  Administered Date(s) Administered  . Influenza Split 05/22/2012  . Influenza, High Dose Seasonal PF 02/10/2013, 03/11/2016  . Influenza,inj,Quad PF,6+ Mos 02/21/2014  . Influenza-Unspecified 02/11/2017  . Pneumococcal Conjugate-13 03/23/2014  . Pneumococcal Polysaccharide-23 04/15/2007, 04/20/2013  . Td 05/13/1998, 08/01/2008  . Zoster 09/14/2013    Screening Tests Health Maintenance  Topic Date Due  . Hepatitis C Screening  Feb 21, 1946  . INFLUENZA VACCINE  02/10/2018 (Originally 12/11/2017)  . TETANUS/TDAP  08/02/2018  . COLONOSCOPY  11/17/2018  . PNA vac Low Risk Adult  Completed      Plan:    Please schedule your next medicare wellness visit with me in 1 yr.  Continue to eat heart healthy diet (full of fruits, vegetables, whole grains, lean protein, water--limit salt, fat, and sugar intake) and increase physical activity as tolerated.  Continue doing brain stimulating activities (puzzles, reading, adult coloring books, staying active) to keep memory sharp.   Bring a copy of your living will and/or healthcare power of attorney to your next office visit.   I have personally reviewed and noted the following in the patient's chart:   . Medical and social history . Use of alcohol, tobacco or illicit drugs  . Current medications and supplements . Functional ability and status . Nutritional status . Physical activity . Advanced  directives . List of other physicians . Hospitalizations, surgeries, and ER visits in previous 12 months . Vitals . Screenings to include cognitive, depression, and falls . Referrals and appointments  In addition, I have reviewed and discussed with patient certain preventive protocols, quality metrics, and best practice recommendations. A written personalized care plan for preventive services as well as general preventive health recommendations were provided to patient.     Naaman Plummer [place], [place]  12/12/2017  Kathlene November, MD

## 2017-12-12 ENCOUNTER — Ambulatory Visit (INDEPENDENT_AMBULATORY_CARE_PROVIDER_SITE_OTHER): Payer: PPO | Admitting: Internal Medicine

## 2017-12-12 ENCOUNTER — Ambulatory Visit (INDEPENDENT_AMBULATORY_CARE_PROVIDER_SITE_OTHER): Payer: PPO | Admitting: *Deleted

## 2017-12-12 ENCOUNTER — Encounter: Payer: Self-pay | Admitting: *Deleted

## 2017-12-12 ENCOUNTER — Encounter: Payer: Self-pay | Admitting: Internal Medicine

## 2017-12-12 VITALS — BP 142/80 | HR 58 | Ht 71.0 in | Wt 187.2 lb

## 2017-12-12 VITALS — BP 142/80 | HR 58 | Ht 71.0 in | Wt 187.4 lb

## 2017-12-12 DIAGNOSIS — E7849 Other hyperlipidemia: Secondary | ICD-10-CM | POA: Diagnosis not present

## 2017-12-12 DIAGNOSIS — Z1159 Encounter for screening for other viral diseases: Secondary | ICD-10-CM

## 2017-12-12 DIAGNOSIS — R739 Hyperglycemia, unspecified: Secondary | ICD-10-CM

## 2017-12-12 DIAGNOSIS — R202 Paresthesia of skin: Secondary | ICD-10-CM

## 2017-12-12 DIAGNOSIS — Z0000 Encounter for general adult medical examination without abnormal findings: Secondary | ICD-10-CM

## 2017-12-12 LAB — HEMOGLOBIN A1C: Hgb A1c MFr Bld: 5.9 % (ref 4.6–6.5)

## 2017-12-12 LAB — CBC WITH DIFFERENTIAL/PLATELET
Basophils Absolute: 0 10*3/uL (ref 0.0–0.1)
Basophils Relative: 0.9 % (ref 0.0–3.0)
Eosinophils Absolute: 0.2 10*3/uL (ref 0.0–0.7)
Eosinophils Relative: 3.9 % (ref 0.0–5.0)
HCT: 43.8 % (ref 39.0–52.0)
Hemoglobin: 15 g/dL (ref 13.0–17.0)
Lymphocytes Relative: 36.3 % (ref 12.0–46.0)
Lymphs Abs: 1.6 10*3/uL (ref 0.7–4.0)
MCHC: 34.3 g/dL (ref 30.0–36.0)
MCV: 93.6 fl (ref 78.0–100.0)
Monocytes Absolute: 0.4 10*3/uL (ref 0.1–1.0)
Monocytes Relative: 8.7 % (ref 3.0–12.0)
Neutro Abs: 2.2 10*3/uL (ref 1.4–7.7)
Neutrophils Relative %: 50.2 % (ref 43.0–77.0)
PLATELETS: 283 10*3/uL (ref 150.0–400.0)
RBC: 4.68 Mil/uL (ref 4.22–5.81)
RDW: 13.6 % (ref 11.5–15.5)
WBC: 4.4 10*3/uL (ref 4.0–10.5)

## 2017-12-12 LAB — B12 AND FOLATE PANEL
Folate: >24.1 ng/mL (ref >5.9)
Vitamin B-12: 339 pg/mL (ref 211–911)

## 2017-12-12 LAB — HEPATITIS C ANTIBODY
Hepatitis C Ab: NONREACTIVE
SIGNAL TO CUT-OFF: 0.05 (ref <1.00)

## 2017-12-12 LAB — COMPREHENSIVE METABOLIC PANEL
ALBUMIN: 4.5 g/dL (ref 3.5–5.2)
ALT: 16 U/L (ref 0–53)
AST: 15 U/L (ref 0–37)
Alkaline Phosphatase: 53 U/L (ref 39–117)
BILIRUBIN TOTAL: 0.6 mg/dL (ref 0.2–1.2)
BUN: 17 mg/dL (ref 6–23)
CALCIUM: 9.7 mg/dL (ref 8.4–10.5)
CHLORIDE: 101 meq/L (ref 96–112)
CO2: 33 meq/L — AB (ref 19–32)
CREATININE: 0.97 mg/dL (ref 0.40–1.50)
GFR: 80.38 mL/min (ref >60.00)
Glucose, Bld: 100 mg/dL — ABNORMAL HIGH (ref 70–99)
Potassium: 5.2 mEq/L — ABNORMAL HIGH (ref 3.5–5.1)
SODIUM: 139 meq/L (ref 135–145)
Total Protein: 7.3 g/dL (ref 6.0–8.3)

## 2017-12-12 LAB — LIPID PANEL
CHOL/HDL RATIO: 3
Cholesterol: 159 mg/dL (ref 0–200)
HDL: 48.8 mg/dL (ref >39.00)
LDL Cholesterol: 75 mg/dL (ref 0–99)
NonHDL: 109.92
Triglycerides: 177 mg/dL — ABNORMAL HIGH (ref 0.0–149.0)
VLDL: 35.4 mg/dL (ref 0.0–40.0)

## 2017-12-12 LAB — TSH: TSH: 3.61 u[IU]/mL (ref 0.35–4.50)

## 2017-12-12 NOTE — Assessment & Plan Note (Signed)
-  Td:2010; pneumonia shot 12-08 ; prevnar 03-2014 ;  zostavax :2015 ; shingrex discussed -Cscope x 2 (Dr Inocente Salles) , last coloscopy 11-2008 negative, next in 10 years -2018: DRE and PSA  normal -Labs: CMP, FLP, hep C, CBC, N9R, TSH, V44, folic acid -Exercise: Goes to the gym 3 times a week, does cardio and some weightlifting.  Diet discussed.

## 2017-12-12 NOTE — Patient Instructions (Signed)
Please schedule your next medicare wellness visit with me in 1 yr.  Continue to eat heart healthy diet (full of fruits, vegetables, whole grains, lean protein, water--limit salt, fat, and sugar intake) and increase physical activity as tolerated.  Continue doing brain stimulating activities (puzzles, reading, adult coloring books, staying active) to keep memory sharp.   Bring a copy of your living will and/or healthcare power of attorney to your next office visit.   Kevin [name] , Thank you for taking time to come for your Medicare Wellness Visit. I appreciate your ongoing commitment to your health goals. Please review the following plan we discussed and let me know if I can assist you in the future.   These are the goals we discussed: Goals    . Maintain current lifestyle.     Eat at least 3 small meals per day.    This is a list of the screening recommended for you and due dates:  Health Maintenance  Topic Date Due  .  Hepatitis C: One time screening is recommended by Center for Disease Control  (CDC) for  adults born from [birth year] through 1967.   [birth year]  . Flu Shot  02/10/2018*  . Tetanus Vaccine  08/02/2018  . Colon Cancer Screening  11/17/2018  . Pneumonia vaccines  Completed  *Topic was postponed. The date shown is not the original due date.    Health Maintenance, Male A healthy lifestyle and preventive care is important for your health and wellness. Ask your health care provider about what schedule of regular examinations is right for you. What should I know about weight and diet? Eat a Healthy Diet  Eat plenty of vegetables, fruits, whole grains, low-fat dairy products, and lean protein.  Do not eat a lot of foods high in solid fats, added sugars, or salt.  Maintain a Healthy Weight Regular exercise can help you achieve or maintain a healthy weight. You should:  Do at least 150 minutes of exercise each week. The exercise should increase your heart rate and make you  sweat (moderate-intensity exercise).  Do strength-training exercises at least twice a week.  Watch Your Levels of Cholesterol and Blood Lipids  Have your blood tested for lipids and cholesterol every 5 years starting at 74 years of age. If you are at high risk for heart disease, you should start having your blood tested when you are 74 years old. You may need to have your cholesterol levels checked more often if: ? Your lipid or cholesterol levels are high. ? You are older than 74 years of age. ? You are at high risk for heart disease.  What should I know about cancer screening? Many types of cancers can be detected early and may often be prevented. Lung Cancer  You should be screened every year for lung cancer if: ? You are a current smoker who has smoked for at least 30 years. ? You are a former smoker who has quit within the past 15 years.  Talk to your health care provider about your screening options, when you should start screening, and how often you should be screened.  Colorectal Cancer  Routine colorectal cancer screening usually begins at 74 years of age and should be repeated every 5-10 years until you are 74 years old. You may need to be screened more often if early forms of precancerous polyps or small growths are found. Your health care provider may recommend screening at an earlier age if you have risk factors for  colon cancer.  Your health care provider may recommend using home test kits to check for hidden blood in the stool.  A small camera at the end of a tube can be used to examine your colon (sigmoidoscopy or colonoscopy). This checks for the earliest forms of colorectal cancer.  Prostate and Testicular Cancer  Depending on your age and overall health, your health care provider may do certain tests to screen for prostate and testicular cancer.  Talk to your health care provider about any symptoms or concerns you have about testicular or prostate cancer.  Skin  Cancer  Check your skin from head to toe regularly.  Tell your health care provider about any new moles or changes in moles, especially if: ? There is a change in a mole's size, shape, or color. ? You have a mole that is larger than a pencil eraser.  Always use sunscreen. Apply sunscreen liberally and repeat throughout the day.  Protect yourself by wearing long sleeves, pants, a wide-brimmed hat, and sunglasses when outside.  What should I know about heart disease, diabetes, and high blood pressure?  If you are 64-23 years of age, have your blood pressure checked every 3-5 years. If you are 83 years of age or older, have your blood pressure checked every year. You should have your blood pressure measured twice-once when you are at a hospital or clinic, and once when you are not at a hospital or clinic. Record the average of the two measurements. To check your blood pressure when you are not at a hospital or clinic, you can use: ? An automated blood pressure machine at a pharmacy. ? A home blood pressure monitor.  Talk to your health care provider about your target blood pressure.  If you are between 65-66 years old, ask your health care provider if you should take aspirin to prevent heart disease.  Have regular diabetes screenings by checking your fasting blood sugar level. ? If you are at a normal weight and have a low risk for diabetes, have this test once every three years after the age of 36. ? If you are overweight and have a high risk for diabetes, consider being tested at a younger age or more often.  A one-time screening for abdominal aortic aneurysm (AAA) by ultrasound is recommended for men aged 44-75 years who are current or former smokers. What should I know about preventing infection? Hepatitis B If you have a higher risk for hepatitis B, you should be screened for this virus. Talk with your health care provider to find out if you are at risk for hepatitis B  infection. Hepatitis C Blood testing is recommended for:  Everyone born from [birth year] through 1967.  Anyone with known risk factors for hepatitis C.  Sexually Transmitted Diseases (STDs)  You should be screened each year for STDs including gonorrhea and chlamydia if: ? You are sexually active and are younger than 74 years of age. ? You are older than 74 years of age and your health care provider tells you that you are at risk for this type of infection. ? Your sexual activity has changed since you were last screened and you are at an increased risk for chlamydia or gonorrhea. Ask your health care provider if you are at risk.  Talk with your health care provider about whether you are at high risk of being infected with HIV. Your health care provider may recommend a prescription medicine to help prevent HIV infection.  What else  can I do?  Schedule regular health, dental, and eye exams.  Stay current with your vaccines (immunizations).  Do not use any tobacco products, such as cigarettes, chewing tobacco, and e-cigarettes. If you need help quitting, ask your health care provider.  Limit alcohol intake to no more than 2 drinks per day. One drink equals 12 ounces of beer, 5 ounces of wine, or 1 ounces of hard liquor.  Do not use street drugs.  Do not share needles.  Ask your health care provider for help if you need support or information about quitting drugs.  Tell your health care provider if you often feel depressed.  Tell your health care provider if you have ever been abused or do not feel safe at home. This information is not intended to replace advice given to you by your health care provider. Make sure you discuss any questions you have with your health care provider. Document Released: 10/26/2007 Document Revised: 12/27/2015 Document Reviewed: 01/31/2015 Elsevier Interactive Patient Education  [employer].

## 2017-12-12 NOTE — Progress Notes (Signed)
Subjective:    Patient ID: Kevin [name], male    DOB: 1946-01-27, 72 y.o.   MRN: [id number]  DOS:  12/12/2017 Type of visit - description : CPX Interval history: Feels well, remains active   Review of Systems Every few months gets burning sensation at different parts of the leg, proximally or distally, feeling last 2 to 3 seconds and happens several times overnight.  Tylenol seems to help.  Denies classic neuropathy symptoms such as distal persistent feet burning. No major back pains, no bladder or bowel incontinence  Other than above, a 14 point review of systems is negative     Past Medical History:  Diagnosis Date  . COPD (chronic obstructive pulmonary disease) (HCC)    PFTs 01-2006 mild obstruction, (+) reponse to Bronchodilators    . Hyperlipidemia   . Hypertension   . Shingles    Thorax, 2008    Past Surgical History:  Procedure Laterality Date  . NO PAST SURGERIES      Social History   Socioeconomic History  . Marital status: Married    Spouse name: Not on file  . Number of children: 3  . Years of education: Not on file  . Highest education level: Not on file  Occupational History  . Occupation: retired 01-2013    Employer: [employer]  Social Needs  . Financial resource strain: Not on file  . Food insecurity:    Worry: Not on file    Inability: Not on file  . Transportation needs:    Medical: Not on file    Non-medical: Not on file  Tobacco Use  . Smoking status: Former Smoker    Types: Cigarettes    Start date: 1960    Last attempt to quit: 05/13/2004    Years since quitting: 13.5  . Smokeless tobacco: Never Used  . Tobacco comment: used to smoke 2 ppd   Substance and Sexual Activity  . Alcohol use: Yes    Alcohol/week: 0.0 oz    Comment: 1-2 beers most days  . Drug use: No  . Sexual activity: Not Currently  Lifestyle  . Physical activity:    Days per week: Not on file    Minutes per session: Not on file  . Stress: Not on file  Relationships  .  Social connections:    Talks on phone: Not on file    Gets together: Not on file    Attends religious service: Not on file    Active member of club or organization: Not on file    Attends meetings of clubs or organizations: Not on file    Relationship status: Not on file  . Intimate partner violence:    Fear of current or ex partner: Not on file    Emotionally abused: Not on file    Physically abused: Not on file    Forced sexual activity: Not on file  Other Topics Concern  . Not on file  Social History Narrative   Lives w/ wife   3 children     Family History  Problem Relation Age of Onset  . CAD Father        F MI ~ 43 y/o  . Hypertension Mother        M and S  . Colon cancer Neg Hx   . Prostate cancer Neg Hx   . Diabetes Neg Hx      Allergies as of 12/12/2017      Reactions   Tiotropium Bromide  Monohydrate Hives      Medication List        Accurate as of 12/12/17 11:59 PM. Always use your most recent med list.          aspirin 81 MG tablet Take 81 mg by mouth daily.   calcium carbonate 600 MG Tabs tablet Commonly known as:  OS-CAL Take 600 mg by mouth daily with breakfast.   fluticasone 44 MCG/ACT inhaler Commonly known as:  FLOVENT HFA Inhale 2 puffs into the lungs 2 (two) times daily.   levothyroxine 75 MCG tablet Commonly known as:  SYNTHROID, LEVOTHROID Take 1 tablet (75 mcg total) by mouth daily before breakfast.   metoprolol succinate 50 MG 24 hr tablet Commonly known as:  TOPROL-XL Take 1 tablet (50 mg total) by mouth daily. Take with or immediately following a meal.   MULTIVITAMIN PO Take 1 tablet by mouth daily.   simvastatin 40 MG tablet Commonly known as:  ZOCOR Take 1 tablet (40 mg total) by mouth at bedtime.          Objective:   Physical Exam BP (!) 142/80 (BP Location: Left Arm, Patient Position: Sitting, Cuff Size: Normal)   Pulse (!) 58   Ht 5\' 11"  (1.803 m)   Wt 187 lb 4 oz (84.9 kg)   SpO2 96%   BMI 26.12 kg/m    General: Well developed, NAD, see BMI.  Neck: No  thyromegaly  HEENT:  Normocephalic . Face symmetric, atraumatic Lungs:  CTA B Normal respiratory effort, no intercostal retractions, no accessory muscle use. Heart: RRR,  no murmur.  No pretibial edema bilaterally.  Normal pedal pulses  abdomen:  Not distended, soft, non-tender. No rebound or rigidity.   Skin: Exposed areas without rash. Not pale. Not jaundice Neurologic:  alert & oriented X3.  Speech normal, gait appropriate for age and unassisted Strength symmetric and appropriate for age.  DTR symmetric.  Pinprick examination of the distal lower extremity normal Psych: Cognition and judgment appear intact.  Cooperative with normal attention span and concentration.  Behavior appropriate. No anxious or depressed appearing.     Assessment & Plan:   Assessment Hyperglycemia HTN Hyperlipidemia LUTS COPD: PFTs 2007 mild obstruction, + response to bronchodilators, h/o multiple inhalers intolerance  Shingles 2008, chest.  PLAN: Hyperglycemia: Check a A1c HTN: BP today 120/80, at home in the 120, 125.  Continue metoprolol Hyperlipidemia: Continue simvastatin, checking labs LUTS: Denies symptoms COPD: Denies cough, he gets a slightly DOE after going up two flight of stairs Paresthesias: As described above, atypical, neuro exam unremarkable, in addition to routine labs will check a W38 and folic acid RTC 1 year, sooner if needed

## 2017-12-12 NOTE — Progress Notes (Signed)
Pre visit review using our clinic review tool, if applicable. No additional management support is needed unless otherwise documented below in the visit note. 

## 2017-12-12 NOTE — Patient Instructions (Addendum)
GO TO THE LAB : Get the blood work     GO TO THE FRONT DESK Schedule your next appointment for a   physical exam in 1 year  Consider a Shingrix shot   Check the  blood pressure 2 or 3 times a month   Be sure your blood pressure is between 110/65 and  135/85. If it is consistently higher or lower, let me know

## 2017-12-13 NOTE — Assessment & Plan Note (Signed)
Hyperglycemia: Check a A1c HTN: BP today 120/80, at home in the 120, 125.  Continue metoprolol Hyperlipidemia: Continue simvastatin, checking labs LUTS: Denies symptoms COPD: Denies cough, he gets a slightly DOE after going up two flight of stairs Paresthesias: As described above, atypical, neuro exam unremarkable, in addition to routine labs will check a I63 and folic acid RTC 1 year, sooner if needed

## 2018-01-15 ENCOUNTER — Encounter: Payer: Self-pay | Admitting: Internal Medicine

## 2018-01-15 ENCOUNTER — Ambulatory Visit (INDEPENDENT_AMBULATORY_CARE_PROVIDER_SITE_OTHER): Payer: PPO | Admitting: Internal Medicine

## 2018-01-15 VITALS — BP 126/82 | HR 78 | Temp 98.3°F | Ht 71.0 in | Wt 186.2 lb

## 2018-01-15 DIAGNOSIS — J441 Chronic obstructive pulmonary disease with (acute) exacerbation: Secondary | ICD-10-CM

## 2018-01-15 MED ORDER — PREDNISONE 10 MG PO TABS: ORAL_TABLET | ORAL | 0 refills | Status: DC

## 2018-01-15 MED ORDER — ALBUTEROL SULFATE HFA 108 (90 BASE) MCG/ACT IN AERS: 2.0000 | INHALATION_SPRAY | Freq: Four times a day (QID) | RESPIRATORY_TRACT | 1 refills | Status: DC | PRN

## 2018-01-15 MED ORDER — AZITHROMYCIN 250 MG PO TABS: ORAL_TABLET | ORAL | 0 refills | Status: DC

## 2018-01-15 NOTE — Progress Notes (Signed)
Subjective:    Patient ID: Kevin [name], male    DOB: 06-02-1945, 72 y.o.   MRN: [id number]  DOS:  01/15/2018 Type of visit - description : acute Interval history: Symptoms started 5 days ago with cough, chest congestion, occasional sputum production. He had diarrhea the first 2-day but that is resolved. Robitussin is helping to some extent.   Review of Systems Denies fever chills. Admits to some mild but generalized aches. Has a lot of sinus pressure and congestion with green nasal discharge. + Wheezing.   Past Medical History:  Diagnosis Date  . COPD (chronic obstructive pulmonary disease) (HCC)    PFTs 01-2006 mild obstruction, (+) reponse to Bronchodilators    . Hyperlipidemia   . Hypertension   . Shingles    Thorax, 2008    Past Surgical History:  Procedure Laterality Date  . NO PAST SURGERIES      Social History   Socioeconomic History  . Marital status: Married    Spouse name: Not on file  . Number of children: 3  . Years of education: Not on file  . Highest education level: Not on file  Occupational History  . Occupation: retired 01-2013    Employer: [employer]  Social Needs  . Financial resource strain: Not on file  . Food insecurity:    Worry: Not on file    Inability: Not on file  . Transportation needs:    Medical: Not on file    Non-medical: Not on file  Tobacco Use  . Smoking status: Former Smoker    Types: Cigarettes    Start date: 1960    Last attempt to quit: 05/13/2004    Years since quitting: 13.6  . Smokeless tobacco: Never Used  . Tobacco comment: used to smoke 2 ppd   Substance and Sexual Activity  . Alcohol use: Yes    Alcohol/week: 0.0 standard drinks    Comment: 1-2 beers most days  . Drug use: No  . Sexual activity: Not Currently  Lifestyle  . Physical activity:    Days per week: Not on file    Minutes per session: Not on file  . Stress: Not on file  Relationships  . Social connections:    Talks on phone: Not on file   Gets together: Not on file    Attends religious service: Not on file    Active member of club or organization: Not on file    Attends meetings of clubs or organizations: Not on file    Relationship status: Not on file  . Intimate partner violence:    Fear of current or ex partner: Not on file    Emotionally abused: Not on file    Physically abused: Not on file    Forced sexual activity: Not on file  Other Topics Concern  . Not on file  Social History Narrative   Lives w/ wife   3 children      Allergies as of 01/15/2018      Reactions   Tiotropium Bromide Monohydrate Hives      Medication List        Accurate as of 01/15/18 11:59 PM. Always use your most recent med list.          albuterol 108 (90 Base) MCG/ACT inhaler Commonly known as:  PROVENTIL HFA;VENTOLIN HFA Inhale 2 puffs into the lungs every 6 (six) hours as needed for wheezing or shortness of breath.   aspirin 81 MG tablet Take 81 mg by  mouth daily.   azithromycin 250 MG tablet Commonly known as:  ZITHROMAX 2 tabs a day the first day, then 1 tab a day x 4 days   calcium carbonate 600 MG Tabs tablet Commonly known as:  OS-CAL Take 600 mg by mouth daily with breakfast.   fluticasone 44 MCG/ACT inhaler Commonly known as:  FLOVENT HFA Inhale 2 puffs into the lungs 2 (two) times daily.   levothyroxine 75 MCG tablet Commonly known as:  SYNTHROID, LEVOTHROID Take 1 tablet (75 mcg total) by mouth daily before breakfast.   metoprolol succinate 50 MG 24 hr tablet Commonly known as:  TOPROL-XL Take 1 tablet (50 mg total) by mouth daily. Take with or immediately following a meal.   MULTIVITAMIN PO Take 1 tablet by mouth daily.   predniSONE 10 MG tablet Commonly known as:  DELTASONE 3 tabs x 3 days, 2 tabs x 3 days, 1 tab x 3 days   simvastatin 40 MG tablet Commonly known as:  ZOCOR Take 1 tablet (40 mg total) by mouth at bedtime.          Objective:   Physical Exam BP 126/82 (BP Location: Left Arm,  Patient Position: Sitting, Cuff Size: Normal)   Pulse 78   Temp 98.3 F (36.8 C) (Oral)   Ht 5\' 11"  (1.803 m)   Wt 186 lb 4 oz (84.5 kg)   SpO2 98%   BMI 25.98 kg/m  General:   Well developed, NAD, see BMI.  HEENT:  Normocephalic . Face symmetric, atraumatic.  Left TM obscured by wax, right TM normal.  Nose congested, sinuses no TTP. Lungs:  + Rhonchi and wheezes bilaterally, mild to moderate. Normal respiratory effort, no intercostal retractions, no accessory muscle use. Heart: RRR,  no murmur.  No pretibial edema bilaterally  Skin: Not pale. Not jaundice Neurologic:  alert & oriented X3.  Speech normal, gait appropriate for age and unassisted Psych--  Cognition and judgment appear intact.  Cooperative with normal attention span and concentration.  Behavior appropriate. No anxious or depressed appearing.      Assessment & Plan:   Assessment Hyperglycemia HTN Hyperlipidemia LUTS COPD: PFTs 2007 mild obstruction, + response to bronchodilators, h/o multiple inhalers intolerance  Shingles 2008, chest.  PLAN: COPD exacerbation: We will provide treatment with Zithromax, prednisone, albuterol as needed.  See AVS.  Patient will call if not improving soon.

## 2018-01-15 NOTE — Progress Notes (Signed)
Pre visit review using our clinic review tool, if applicable. No additional management support is needed unless otherwise documented below in the visit note. 

## 2018-01-15 NOTE — Patient Instructions (Signed)
Rest, fluids , tylenol  For cough:  Take Mucinex DM twice a day as needed until better  For wheezing: albuterol as needed   For nasal congestion: Use OTC Nasocort or Flonase : 2 nasal sprays on each side of the nose in the morning until you feel better   Take the antibiotic as prescribed , zithromax Prednisone by mouth  Call if not gradually better over the next  10 days  Call anytime if the symptoms are severe

## 2018-01-16 NOTE — Assessment & Plan Note (Signed)
COPD exacerbation: We will provide treatment with Zithromax, prednisone, albuterol as needed.  See AVS.  Patient will call if not improving soon.

## 2018-02-01 ENCOUNTER — Other Ambulatory Visit: Payer: Self-pay | Admitting: Internal Medicine

## 2018-03-10 ENCOUNTER — Other Ambulatory Visit: Payer: Self-pay | Admitting: Internal Medicine

## 2018-05-13 HISTORY — PX: POLYPECTOMY: SHX149

## 2018-07-08 ENCOUNTER — Other Ambulatory Visit: Payer: Self-pay | Admitting: Internal Medicine

## 2018-12-01 DIAGNOSIS — L820 Inflamed seborrheic keratosis: Secondary | ICD-10-CM | POA: Diagnosis not present

## 2018-12-01 DIAGNOSIS — D485 Neoplasm of uncertain behavior of skin: Secondary | ICD-10-CM | POA: Diagnosis not present

## 2018-12-01 DIAGNOSIS — L814 Other melanin hyperpigmentation: Secondary | ICD-10-CM | POA: Diagnosis not present

## 2018-12-01 DIAGNOSIS — L570 Actinic keratosis: Secondary | ICD-10-CM | POA: Diagnosis not present

## 2018-12-01 DIAGNOSIS — D225 Melanocytic nevi of trunk: Secondary | ICD-10-CM | POA: Diagnosis not present

## 2018-12-01 DIAGNOSIS — L719 Rosacea, unspecified: Secondary | ICD-10-CM | POA: Diagnosis not present

## 2018-12-01 DIAGNOSIS — L821 Other seborrheic keratosis: Secondary | ICD-10-CM | POA: Diagnosis not present

## 2018-12-16 NOTE — Progress Notes (Signed)
Subjective:   Kevin [name] is a 73 y.o. male who presents for Medicare Annual/Subsequent preventive examination.  Review of Systems: NO ROS  Cardiac Risk Factors include: advanced age (>31men, >13 women);dyslipidemia;male gender;hypertension Home Safety/Smoke Alarms: Feels safe in home. Smoke alarms in place.  Lives with wife in 2 story home. No issues w/ stairs.  Male:   CCS-  Referral to GI placed by PCP today.   PSA-  Lab Results  Component Value Date   PSA 1.01 12/10/2016   PSA 0.73 10/11/2014   PSA 0.48 04/20/2013       Objective:    Vitals: BP (!) 166/84 (BP Location: Left Arm, Patient Position: Sitting, Cuff Size: Small) Comment: all vitals done in 9am PCP visit by K.Canter CMA  Pulse 62   Temp 98.1 F (36.7 C) (Oral)   Ht 5\' 11"  (1.803 m)   Wt 181 lb (82.1 kg)   SpO2 96%   BMI 25.24 kg/m   Body mass index is 25.24 kg/m.  Advanced Directives 12/18/2018 12/12/2017 12/10/2016  Does Patient Have a Medical Advance Directive? No No No  Would patient like information on creating a medical advance directive? No - Patient declined No - Patient declined Yes (MAU/Ambulatory/Procedural Areas - Information given)    Tobacco Social History   Tobacco Use  Smoking Status Former Smoker  . Types: Cigarettes  . Start date: 63  . Quit date: 05/13/2004  . Years since quitting: 14.6  Smokeless Tobacco Never Used  Tobacco Comment   used to smoke 2 ppd      Counseling given: Not Answered Comment: used to smoke 2 ppd    Clinical Intake:     Pain : No/denies pain                 Past Medical History:  Diagnosis Date  . COPD (chronic obstructive pulmonary disease) (HCC)    PFTs 01-2006 mild obstruction, (+) reponse to Bronchodilators    . Hyperlipidemia   . Hypertension   . Shingles    Thorax, 2008   Past Surgical History:  Procedure Laterality Date  . NO PAST SURGERIES     Family History  Problem Relation Age of Onset  . CAD Father        F MI ~ 25  y/o  . Hypertension Mother        M and S  . Colon cancer Neg Hx   . Prostate cancer Neg Hx   . Diabetes Neg Hx    Social History   Socioeconomic History  . Marital status: Married    Spouse name: Not on file  . Number of children: 3  . Years of education: Not on file  . Highest education level: Not on file  Occupational History  . Occupation: retired 01-2013    Employer: [employer]  Social Needs  . Financial resource strain: Not on file  . Food insecurity    Worry: Not on file    Inability: Not on file  . Transportation needs    Medical: Not on file    Non-medical: Not on file  Tobacco Use  . Smoking status: Former Smoker    Types: Cigarettes    Start date: 1960    Quit date: 05/13/2004    Years since quitting: 14.6  . Smokeless tobacco: Never Used  . Tobacco comment: used to smoke 2 ppd   Substance and Sexual Activity  . Alcohol use: Yes    Alcohol/week: 0.0 standard drinks  Comment: 1-2 beers most days  . Drug use: No  . Sexual activity: Not Currently  Lifestyle  . Physical activity    Days per week: Not on file    Minutes per session: Not on file  . Stress: Not on file  Relationships  . Social Herbalist on phone: Not on file    Gets together: Not on file    Attends religious service: Not on file    Active member of club or organization: Not on file    Attends meetings of clubs or organizations: Not on file    Relationship status: Not on file  Other Topics Concern  . Not on file  Social History Narrative   Lives w/ wife   3 children    Outpatient Encounter Medications as of 12/18/2018  Medication Sig  . albuterol (VENTOLIN HFA) 108 (90 Base) MCG/ACT inhaler Inhale 2 puffs into the lungs every 6 (six) hours as needed for wheezing or shortness of breath.  Marland Kitchen aspirin 81 MG tablet Take 81 mg by mouth daily.  . budesonide-formoterol (SYMBICORT) 80-4.5 MCG/ACT inhaler Inhale 2 puffs into the lungs 2 (two) times daily.  . calcium carbonate (OS-CAL) 600  MG TABS tablet Take 600 mg by mouth daily with breakfast.  . levothyroxine (SYNTHROID, LEVOTHROID) 75 MCG tablet Take 1 tablet (75 mcg total) by mouth daily before breakfast.  . metoprolol succinate (TOPROL-XL) 50 MG 24 hr tablet Take 1 tablet (50 mg total) by mouth daily. Take with or immediately following a meal.  . Multiple Vitamins-Minerals (MULTIVITAMIN PO) Take 1 tablet by mouth daily.  . simvastatin (ZOCOR) 40 MG tablet Take 1 tablet (40 mg total) by mouth at bedtime.  . vitamin E (VITAMIN E) 200 UNIT capsule Take 200 Units by mouth daily.  . [DISCONTINUED] azithromycin (ZITHROMAX Z-PAK) 250 MG tablet 2 tabs a day the first day, then 1 tab a day x 4 days (Patient not taking: Reported on 12/18/2018)  . [DISCONTINUED] fluticasone (FLOVENT HFA) 44 MCG/ACT inhaler Inhale 2 puffs into the lungs 2 (two) times daily.  . [DISCONTINUED] predniSONE (DELTASONE) 10 MG tablet 3 tabs x 3 days, 2 tabs x 3 days, 1 tab x 3 days (Patient not taking: Reported on 12/18/2018)   No facility-administered encounter medications on file as of 12/18/2018.     Activities of Daily Living In your present state of health, do you have any difficulty performing the following activities: 12/18/2018 12/18/2018  Hearing? N N  Vision? N N  Difficulty concentrating or making decisions? N N  Walking or climbing stairs? N N  Dressing or bathing? N N  Doing errands, shopping? N N  Preparing Food and eating ? N -  Using the Toilet? N -  In the past six months, have you accidently leaked urine? N -  Do you have problems with loss of bowel control? Y -  Comment referral to GI placed by PCP today -  Managing your Medications? N -  Managing your Finances? N -  Housekeeping or managing your Housekeeping? N -  Some recent data might be hidden    Patient Care Team: Colon Branch, MD as PCP - General   Assessment:   This is a routine wellness examination for Kevin [name]. Physical assessment deferred to PCP.  Exercise Activities and Dietary  recommendations Current Exercise Habits: The patient does not participate in regular exercise at present, Exercise limited by: respiratory conditions(s) Diet (meal preparation, eat out, water intake, caffeinated beverages, dairy  products, fruits and vegetables): well balanced, on average, 2 meals per day   Goals    . Maintain current lifestyle (pt-stated)       Fall Risk Fall Risk  12/18/2018 12/18/2018 12/12/2017 12/10/2016 12/07/2015  Falls in the past year? 0 0 No No No     Depression Screen PHQ 2/9 Scores 12/18/2018 12/18/2018 12/12/2017 12/10/2016  PHQ - 2 Score 0 0 0 0    Cognitive Function Ad8 score reviewed for issues:  Issues making decisions:no  Less interest in hobbies / activities:no  Repeats questions, stories (family complaining):no  Trouble using ordinary gadgets (microwave, computer, phone):no  Forgets the month or year: no  Mismanaging finances: no  Remembering appts:no  Daily problems with thinking and/or memory:no Ad8 score is=0     MMSE - Mini Mental State Exam 12/10/2016  Orientation to time 5  Orientation to Place 5  Registration 3  Attention/ Calculation 4  Recall 2  Language- name 2 objects 2  Language- repeat 1  Language- follow 3 step command 3  Language- read & follow direction 1  Write a sentence 1  Copy design 1  Total score 28        Immunization History  Administered Date(s) Administered  . Influenza Split 05/22/2012  . Influenza, High Dose Seasonal PF 02/10/2013, 03/11/2016  . Influenza,inj,Quad PF,6+ Mos 02/21/2014  . Influenza-Unspecified 02/11/2017  . Pneumococcal Conjugate-13 03/23/2014  . Pneumococcal Polysaccharide-23 04/15/2007, 04/20/2013  . Td 05/13/1998, 08/01/2008  . Zoster 09/14/2013   Screening Tests Health Maintenance  Topic Date Due  . INFLUENZA VACCINE  02/11/2019 (Originally 12/12/2018)  . COLONOSCOPY  12/18/2019 (Originally 11/17/2018)  . TETANUS/TDAP  12/18/2019 (Originally 08/02/2018)  . Hepatitis C Screening   Completed  . PNA vac Low Risk Adult  Completed         Plan:   See you next year!  Continue to eat heart healthy diet (full of fruits, vegetables, whole grains, lean protein, water--limit salt, fat, and sugar intake) and increase physical activity as tolerated.  Continue doing brain stimulating activities (puzzles, reading, adult coloring books, staying active) to keep memory sharp.     I have personally reviewed and noted the following in the patient's chart:   . Medical and social history . Use of alcohol, tobacco or illicit drugs  . Current medications and supplements . Functional ability and status . Nutritional status . Physical activity . Advanced directives . List of other physicians . Hospitalizations, surgeries, and ER visits in previous 12 months . Vitals . Screenings to include cognitive, depression, and falls . Referrals and appointments  In addition, I have reviewed and discussed with patient certain preventive protocols, quality metrics, and best practice recommendations. A written personalized care plan for preventive services as well as general preventive health recommendations were provided to patient.     Shela Nevin, [place]  12/18/2018

## 2018-12-18 ENCOUNTER — Ambulatory Visit (INDEPENDENT_AMBULATORY_CARE_PROVIDER_SITE_OTHER): Payer: PPO | Admitting: Internal Medicine

## 2018-12-18 ENCOUNTER — Encounter: Payer: Self-pay | Admitting: Internal Medicine

## 2018-12-18 ENCOUNTER — Encounter: Payer: Self-pay | Admitting: *Deleted

## 2018-12-18 ENCOUNTER — Ambulatory Visit (HOSPITAL_BASED_OUTPATIENT_CLINIC_OR_DEPARTMENT_OTHER)
Admission: RE | Admit: 2018-12-18 | Discharge: 2018-12-18 | Disposition: A | Discharge status: Home / Self Care | Payer: PPO | Source: Ambulatory Visit | Attending: Internal Medicine | Admitting: Internal Medicine

## 2018-12-18 ENCOUNTER — Encounter: Payer: Self-pay | Admitting: Gastroenterology

## 2018-12-18 ENCOUNTER — Ambulatory Visit (INDEPENDENT_AMBULATORY_CARE_PROVIDER_SITE_OTHER): Payer: PPO | Admitting: *Deleted

## 2018-12-18 ENCOUNTER — Other Ambulatory Visit: Payer: Self-pay

## 2018-12-18 VITALS — BP 166/84 | HR 62 | Temp 98.1°F | Ht 71.0 in | Wt 181.0 lb

## 2018-12-18 VITALS — BP 166/84 | HR 62 | Temp 98.1°F | Resp 18 | Ht 71.0 in | Wt 181.2 lb

## 2018-12-18 DIAGNOSIS — Z0001 Encounter for general adult medical examination with abnormal findings: Secondary | ICD-10-CM

## 2018-12-18 DIAGNOSIS — R194 Change in bowel habit: Secondary | ICD-10-CM

## 2018-12-18 DIAGNOSIS — E7849 Other hyperlipidemia: Secondary | ICD-10-CM

## 2018-12-18 DIAGNOSIS — E039 Hypothyroidism, unspecified: Secondary | ICD-10-CM | POA: Diagnosis not present

## 2018-12-18 DIAGNOSIS — E038 Other specified hypothyroidism: Secondary | ICD-10-CM

## 2018-12-18 DIAGNOSIS — J410 Simple chronic bronchitis: Secondary | ICD-10-CM | POA: Insufficient documentation

## 2018-12-18 DIAGNOSIS — R0609 Other forms of dyspnea: Secondary | ICD-10-CM | POA: Insufficient documentation

## 2018-12-18 DIAGNOSIS — Z0000 Encounter for general adult medical examination without abnormal findings: Secondary | ICD-10-CM

## 2018-12-18 DIAGNOSIS — Z125 Encounter for screening for malignant neoplasm of prostate: Secondary | ICD-10-CM | POA: Diagnosis not present

## 2018-12-18 DIAGNOSIS — J439 Emphysema, unspecified: Secondary | ICD-10-CM | POA: Diagnosis not present

## 2018-12-18 DIAGNOSIS — R739 Hyperglycemia, unspecified: Secondary | ICD-10-CM | POA: Diagnosis not present

## 2018-12-18 LAB — COMPREHENSIVE METABOLIC PANEL
ALT: 18 U/L (ref 0–53)
AST: 18 U/L (ref 0–37)
Albumin: 4.4 g/dL (ref 3.5–5.2)
Alkaline Phosphatase: 52 U/L (ref 39–117)
BUN: 14 mg/dL (ref 6–23)
CO2: 31 mEq/L (ref 19–32)
Calcium: 9.8 mg/dL (ref 8.4–10.5)
Chloride: 102 mEq/L (ref 96–112)
Creatinine, Ser: 1.05 mg/dL (ref 0.40–1.50)
GFR: 68.83 mL/min (ref >60.00)
Glucose, Bld: 93 mg/dL (ref 70–99)
Potassium: 5.2 mEq/L — ABNORMAL HIGH (ref 3.5–5.1)
Sodium: 140 mEq/L (ref 135–145)
Total Bilirubin: 0.5 mg/dL (ref 0.2–1.2)
Total Protein: 7.1 g/dL (ref 6.0–8.3)

## 2018-12-18 LAB — HEMOCCULT GUIAC POC 1CARD (OFFICE): Fecal Occult Blood, POC: NEGATIVE

## 2018-12-18 LAB — CBC WITH DIFFERENTIAL/PLATELET
Basophils Absolute: 0 10*3/uL (ref 0.0–0.1)
Basophils Relative: 0.7 % (ref 0.0–3.0)
Eosinophils Absolute: 0.3 10*3/uL (ref 0.0–0.7)
Eosinophils Relative: 6.5 % — ABNORMAL HIGH (ref 0.0–5.0)
HCT: 44.1 % (ref 39.0–52.0)
Hemoglobin: 14.8 g/dL (ref 13.0–17.0)
Lymphocytes Relative: 35.9 % (ref 12.0–46.0)
Lymphs Abs: 1.5 10*3/uL (ref 0.7–4.0)
MCHC: 33.5 g/dL (ref 30.0–36.0)
MCV: 96.2 fl (ref 78.0–100.0)
Monocytes Absolute: 0.4 10*3/uL (ref 0.1–1.0)
Monocytes Relative: 9.5 % (ref 3.0–12.0)
Neutro Abs: 2 10*3/uL (ref 1.4–7.7)
Neutrophils Relative %: 47.4 % (ref 43.0–77.0)
Platelets: 269 10*3/uL (ref 150.0–400.0)
RBC: 4.59 Mil/uL (ref 4.22–5.81)
RDW: 13.7 % (ref 11.5–15.5)
WBC: 4.3 10*3/uL (ref 4.0–10.5)

## 2018-12-18 LAB — LIPID PANEL
Cholesterol: 149 mg/dL (ref 0–200)
HDL: 53.5 mg/dL (ref >39.00)
LDL Cholesterol: 73 mg/dL (ref 0–99)
NonHDL: 95.95
Total CHOL/HDL Ratio: 3
Triglycerides: 115 mg/dL (ref 0.0–149.0)
VLDL: 23 mg/dL (ref 0.0–40.0)

## 2018-12-18 LAB — PSA: PSA: 0.69 ng/mL (ref 0.10–4.00)

## 2018-12-18 LAB — TSH: TSH: 3.57 u[IU]/mL (ref 0.35–4.50)

## 2018-12-18 LAB — HEMOGLOBIN A1C: Hgb A1c MFr Bld: 5.7 % (ref 4.6–6.5)

## 2018-12-18 MED ORDER — TETANUS-DIPHTH-ACELL PERTUSSIS 5-2.5-18.5 LF-MCG/0.5 IM SUSP: 0.5000 mL | Freq: Once | INTRAMUSCULAR | 0 refills | Status: AC

## 2018-12-18 MED ORDER — SHINGRIX 50 MCG/0.5ML IM SUSR: 0.5000 mL | Freq: Once | INTRAMUSCULAR | 1 refills | Status: AC

## 2018-12-18 MED ORDER — BUDESONIDE-FORMOTEROL FUMARATE 80-4.5 MCG/ACT IN AERO: 2.0000 | INHALATION_SPRAY | Freq: Two times a day (BID) | RESPIRATORY_TRACT | 3 refills | Status: DC

## 2018-12-18 NOTE — Progress Notes (Signed)
Pre visit review using our clinic review tool, if applicable. No additional management support is needed unless otherwise documented below in the visit note. 

## 2018-12-18 NOTE — Assessment & Plan Note (Addendum)
-  Td:2010; Tdap printed - pneumonia shot 12-08  - prevnar 03-2014 -  zostavax :2015 -shingrex.  Prescription printed -Cscope x 2 (Dr Inocente Salles) , last coloscopy 11-2008 negative, referring to GI d/t change in bowel habits -DRE normal today, check a PSA -Labs: CMP, FLP, CBC, TSH, PSA, A1c -Lifestyle discussed

## 2018-12-18 NOTE — Assessment & Plan Note (Signed)
Hyperglycemia: Check A1c HTN: Slightly elevated here, at home is normal, continue metoprolol. Hypothyroidism: Continue Synthroid, checking a TSH COPD: Presents today with increased DOE, cough hemoptysis almost daily. Exam is benign. EKG today NSR, no acute. Plan: Chest x-ray, labs, pulmonary referral.  Switch from Flovent to Symbicort. Change in bowel habits: As described above, Hemoccult negative.  GI referral, likely needs further eval. RTC 3 months

## 2018-12-18 NOTE — Patient Instructions (Addendum)
GO TO THE LAB : Get the blood work     GO TO THE FRONT DESK Schedule your next appointment   for a checkup in 3 months  STOP BY THE FIRST FLOOR:  get the XR   Stop Flovent Symbicort twice a day  Get a flu shot as soon as it is available  Referrals: Gastroenterologist Pulmonologist  Continue checking your blood pressures.  BP GOAL is between 110/65 and  135/85. If it is consistently higher or lower, let me know

## 2018-12-18 NOTE — Progress Notes (Signed)
Subjective:    Patient ID: Kevin [name], male    DOB: 01-07-1946, 73 y.o.   MRN: [id number]  DOS:  12/18/2018 Type of visit - description: CPX Here for CPX, a number of other issues discussed  COPD: In the last few months, cough has increased, he coughs daily with mucus.  In the mornings often times he sees blood in the sputum. Also DOE gradually worse over the last year, get short of breath when going upstairs to the second floor of his residence. Denies fever, chills. No exertional chest pain  GI: Has noted a change in his bowel movement patterns, he now has 3 bowel movements daily, small amount of his stools, some degree of bowel incontinence. No diarrhea per se, no abdominal pain, no blood in the stools or weight loss.   Review of Systems see above Specifically denies edema or palpitations Emotionally doing well No LUTS.  Other than above, a 14 point review of systems is negative     Past Medical History:  Diagnosis Date  . COPD (chronic obstructive pulmonary disease) (HCC)    PFTs 01-2006 mild obstruction, (+) reponse to Bronchodilators    . Hyperlipidemia   . Hypertension   . Shingles    Thorax, 2008    Past Surgical History:  Procedure Laterality Date  . NO PAST SURGERIES      Social History   Socioeconomic History  . Marital status: Married    Spouse name: Not on file  . Number of children: 3  . Years of education: Not on file  . Highest education level: Not on file  Occupational History  . Occupation: retired 01-2013    Employer: [employer]  Social Needs  . Financial resource strain: Not on file  . Food insecurity    Worry: Not on file    Inability: Not on file  . Transportation needs    Medical: Not on file    Non-medical: Not on file  Tobacco Use  . Smoking status: Former Smoker    Types: Cigarettes    Start date: 1960    Quit date: 05/13/2004    Years since quitting: 14.6  . Smokeless tobacco: Never Used  . Tobacco comment: used to smoke 2 ppd    Substance and Sexual Activity  . Alcohol use: Yes    Alcohol/week: 0.0 standard drinks    Comment: 1-2 beers most days  . Drug use: No  . Sexual activity: Not Currently  Lifestyle  . Physical activity    Days per week: Not on file    Minutes per session: Not on file  . Stress: Not on file  Relationships  . Social Herbalist on phone: Not on file    Gets together: Not on file    Attends religious service: Not on file    Active member of club or organization: Not on file    Attends meetings of clubs or organizations: Not on file    Relationship status: Not on file  . Intimate partner violence    Fear of current or ex partner: Not on file    Emotionally abused: Not on file    Physically abused: Not on file    Forced sexual activity: Not on file  Other Topics Concern  . Not on file  Social History Narrative   Lives w/ wife   3 children     Family History  Problem Relation Age of Onset  . CAD Father  F MI ~ 79 y/o  . Hypertension Mother        M and S  . Colon cancer Neg Hx   . Prostate cancer Neg Hx   . Diabetes Neg Hx      Allergies as of 12/18/2018      Reactions   Tiotropium Bromide Monohydrate Hives      Medication List       Accurate as of December 18, 2018  4:44 PM. If you have any questions, ask your nurse or doctor.        STOP taking these medications   azithromycin 250 MG tablet Commonly known as: Zithromax Z-Pak Stopped by: Kathlene November, MD   fluticasone 44 MCG/ACT inhaler Commonly known as: Flovent HFA Stopped by: Kathlene November, MD   predniSONE 10 MG tablet Commonly known as: DELTASONE Stopped by: Kathlene November, MD     TAKE these medications   albuterol 108 (90 Base) MCG/ACT inhaler Commonly known as: Ventolin HFA Inhale 2 puffs into the lungs every 6 (six) hours as needed for wheezing or shortness of breath.   aspirin 81 MG tablet Take 81 mg by mouth daily.   budesonide-formoterol 80-4.5 MCG/ACT inhaler Commonly known as: SYMBICORT  Inhale 2 puffs into the lungs 2 (two) times daily. Started by: Kathlene November, MD   calcium carbonate 600 MG Tabs tablet Commonly known as: OS-CAL Take 600 mg by mouth daily with breakfast.   levothyroxine 75 MCG tablet Commonly known as: SYNTHROID Take 1 tablet (75 mcg total) by mouth daily before breakfast.   metoprolol succinate 50 MG 24 hr tablet Commonly known as: TOPROL-XL Take 1 tablet (50 mg total) by mouth daily. Take with or immediately following a meal.   MULTIVITAMIN PO Take 1 tablet by mouth daily.   Shingrix injection Generic drug: Zoster Vaccine Adjuvanted Inject 0.5 mLs into the muscle once for 1 dose. Started by: Kathlene November, MD   simvastatin 40 MG tablet Commonly known as: ZOCOR Take 1 tablet (40 mg total) by mouth at bedtime.   Tdap 5-2.5-18.5 LF-MCG/0.5 injection Commonly known as: BOOSTRIX Inject 0.5 mLs into the muscle once for 1 dose. Started by: Kathlene November, MD   vitamin E 200 UNIT capsule Generic drug: vitamin E Take 200 Units by mouth daily.           Objective:   Physical Exam BP (!) 166/84 (BP Location: Left Arm, Patient Position: Sitting, Cuff Size: Small)   Pulse 62   Temp 98.1 F (36.7 C) (Oral)   Resp 18   Ht 5\' 11"  (1.803 m)   Wt 181 lb 4 oz (82.2 kg)   SpO2 96%   BMI 25.28 kg/m   General: Well developed, NAD, BMI noted Neck: No  thyromegaly.  No supraclavicular mass.  No lymphadenopathies HEENT:  Normocephalic . Face symmetric, atraumatic Lungs:  Decreased breath sounds Normal respiratory effort, no intercostal retractions, no accessory muscle use. Heart: RRR,  no murmur.  No pretibial edema bilaterally  Abdomen:  Not distended, soft, non-tender. No rebound or rigidity.   DRE: Sphincter tone normal to slightly decreased.  Normal prostate,  brown stools, Hemoccult negative. Skin: Exposed areas without rash. Not pale. Not jaundice Neurologic:  alert & oriented X3.  Speech normal, gait appropriate for age and unassisted  Strength symmetric and appropriate for age.  Psych: Cognition and judgment appear intact.  Cooperative with normal attention span and concentration.  Behavior appropriate. No anxious or depressed appearing.     Assessment  Assessment Hyperglycemia HTN Hypothyroidism Hyperlipidemia LUTS COPD: PFTs 2007 mild obstruction, + response to bronchodilators, h/o multiple inhalers intolerance  Shingles 2008, chest.  PLAN: Hyperglycemia: Check A1c HTN: Slightly elevated here, at home is normal, continue metoprolol. Hypothyroidism: Continue Synthroid, checking a TSH COPD: Presents today with increased DOE, cough hemoptysis almost daily. Exam is benign. EKG today NSR, no acute. Plan: Chest x-ray, labs, pulmonary referral.  Switch from Flovent to Symbicort. Change in bowel habits: As described above, Hemoccult negative.  GI referral, likely needs further eval. RTC 3 months  Today in addition to his physical exam, I spent more than 20 min with the patient: >50% of the time counseling regards to new issues, increasing DOE, change in bowel habits.  Also assessing chronic problems.

## 2018-12-18 NOTE — Patient Instructions (Signed)
See you next year!  Continue to eat heart healthy diet (full of fruits, vegetables, whole grains, lean protein, water--limit salt, fat, and sugar intake) and increase physical activity as tolerated.  Continue doing brain stimulating activities (puzzles, reading, adult coloring books, staying active) to keep memory sharp.    Kevin [name] , Thank you for taking time to come for your Medicare Wellness Visit. I appreciate your ongoing commitment to your health goals. Please review the following plan we discussed and let me know if I can assist you in the future.   These are the goals we discussed: Goals    . Maintain current lifestyle (pt-stated)       This is a list of the screening recommended for you and due dates:  Health Maintenance  Topic Date Due  . Flu Shot  02/11/2019*  . Colon Cancer Screening  12/18/2019*  . Tetanus Vaccine  12/18/2019*  .  Hepatitis C: One time screening is recommended by Center for Disease Control  (CDC) for  adults born from [birth year] through 1967.   Completed  . Pneumonia vaccines  Completed  *Topic was postponed. The date shown is not the original due date.    Health Maintenance After Age 11 After age 66, you are at a higher risk for certain long-term diseases and infections as well as injuries from falls. Falls are a major cause of broken bones and head injuries in people who are older than age 25. Getting regular preventive care can help to keep you healthy and well. Preventive care includes getting regular testing and making lifestyle changes as recommended by your health care provider. Talk with your health care provider about:  Which screenings and tests you should have. A screening is a test that checks for a disease when you have no symptoms.  A diet and exercise plan that is right for you. What should I know about screenings and tests to prevent falls? Screening and testing are the best ways to find a health problem early. Early diagnosis and treatment  give you the best chance of managing medical conditions that are common after age 39. Certain conditions and lifestyle choices may make you more likely to have a fall. Your health care provider may recommend:  Regular vision checks. Poor vision and conditions such as cataracts can make you more likely to have a fall. If you wear glasses, make sure to get your prescription updated if your vision changes.  Medicine review. Work with your health care provider to regularly review all of the medicines you are taking, including over-the-counter medicines. Ask your health care provider about any side effects that may make you more likely to have a fall. Tell your health care provider if any medicines that you take make you feel dizzy or sleepy.  Osteoporosis screening. Osteoporosis is a condition that causes the bones to get weaker. This can make the bones weak and cause them to break more easily.  Blood pressure screening. Blood pressure changes and medicines to control blood pressure can make you feel dizzy.  Strength and balance checks. Your health care provider may recommend certain tests to check your strength and balance while standing, walking, or changing positions.  Foot health exam. Foot pain and numbness, as well as not wearing proper footwear, can make you more likely to have a fall.  Depression screening. You may be more likely to have a fall if you have a fear of falling, feel emotionally low, or feel unable to do activities  that you used to do.  Alcohol use screening. Using too much alcohol can affect your balance and may make you more likely to have a fall. What actions can I take to lower my risk of falls? General instructions  Talk with your health care provider about your risks for falling. Tell your health care provider if: ? You fall. Be sure to tell your health care provider about all falls, even ones that seem minor. ? You feel dizzy, sleepy, or off-balance.  Take  over-the-counter and prescription medicines only as told by your health care provider. These include any supplements.  Eat a healthy diet and maintain a healthy weight. A healthy diet includes low-fat dairy products, low-fat (lean) meats, and fiber from whole grains, beans, and lots of fruits and vegetables. Home safety  Remove any tripping hazards, such as rugs, cords, and clutter.  Install safety equipment such as grab bars in bathrooms and safety rails on stairs.  Keep rooms and walkways well-lit. Activity   Follow a regular exercise program to stay fit. This will help you maintain your balance. Ask your health care provider what types of exercise are appropriate for you.  If you need a cane or walker, use it as recommended by your health care provider.  Wear supportive shoes that have nonskid soles. Lifestyle  Do not drink alcohol if your health care provider tells you not to drink.  If you drink alcohol, limit how much you have: ? 0-1 drink a day for women. ? 0-2 drinks a day for men.  Be aware of how much alcohol is in your drink. In the [place] one drink equals one typical bottle of beer (12 oz), one-half glass of wine (5 oz), or one shot of hard liquor (1 oz).  Do not use any products that contain nicotine or tobacco, such as cigarettes and e-cigarettes. If you need help quitting, ask your health care provider. Summary  Having a healthy lifestyle and getting preventive care can help to protect your health and wellness after age 3.  Screening and testing are the best way to find a health problem early and help you avoid having a fall. Early diagnosis and treatment give you the best chance for managing medical conditions that are more common for people who are older than age 20.  Falls are a major cause of broken bones and head injuries in people who are older than age 23. Take precautions to prevent a fall at home.  Work with your health care provider to learn what changes  you can make to improve your health and wellness and to prevent falls. This information is not intended to replace advice given to you by your health care provider. Make sure you discuss any questions you have with your health care provider. Document Released: 03/12/2017 Document Revised: 08/20/2018 Document Reviewed: 03/12/2017 Elsevier Patient Education  2020 [employer].

## 2019-01-01 ENCOUNTER — Other Ambulatory Visit: Payer: Self-pay | Admitting: Internal Medicine

## 2019-01-11 ENCOUNTER — Other Ambulatory Visit: Payer: Self-pay

## 2019-01-11 ENCOUNTER — Ambulatory Visit (AMBULATORY_SURGERY_CENTER): Payer: Self-pay | Admitting: *Deleted

## 2019-01-11 VITALS — Ht 71.0 in | Wt 182.4 lb

## 2019-01-11 DIAGNOSIS — Z1211 Encounter for screening for malignant neoplasm of colon: Secondary | ICD-10-CM

## 2019-01-11 MED ORDER — GOLYTELY 236 G PO SOLR: 4000.0000 mL | Freq: Once | ORAL | 0 refills | Status: AC

## 2019-01-11 NOTE — Progress Notes (Signed)
Patient denies any allergies to egg or soy products. Patient denies complications with anesthesia/sedation.  Patient denies oxygen use at home and denies diet medications. Emmi instructions for colonoscopy explained but patient denied information.Marland Kitchen

## 2019-01-12 HISTORY — PX: COLONOSCOPY: SHX174

## 2019-01-13 ENCOUNTER — Encounter: Payer: Self-pay | Admitting: Gastroenterology

## 2019-01-25 ENCOUNTER — Encounter: Payer: Self-pay | Admitting: Gastroenterology

## 2019-01-25 ENCOUNTER — Ambulatory Visit (AMBULATORY_SURGERY_CENTER): Payer: PPO | Admitting: Gastroenterology

## 2019-01-25 ENCOUNTER — Other Ambulatory Visit: Payer: Self-pay

## 2019-01-25 VITALS — BP 145/70 | HR 65 | Temp 98.2°F | Resp 21 | Ht 71.0 in | Wt 182.0 lb

## 2019-01-25 DIAGNOSIS — D124 Benign neoplasm of descending colon: Secondary | ICD-10-CM

## 2019-01-25 DIAGNOSIS — D122 Benign neoplasm of ascending colon: Secondary | ICD-10-CM

## 2019-01-25 DIAGNOSIS — D123 Benign neoplasm of transverse colon: Secondary | ICD-10-CM | POA: Diagnosis not present

## 2019-01-25 DIAGNOSIS — Z1211 Encounter for screening for malignant neoplasm of colon: Secondary | ICD-10-CM

## 2019-01-25 MED ORDER — SODIUM CHLORIDE 0.9 % IV SOLN: 500.0000 mL | Freq: Once | INTRAVENOUS | Status: DC

## 2019-01-25 NOTE — Op Note (Signed)
[place] Patient Name: Kevin [name] Procedure Date: 01/25/2019 10:23 AM MRN: [id number] Endoscopist: Mallie Mussel L. Loletha Carrow , MD Age: 73 Referring MD:  Date of Birth: [birth year] Gender: Male Account #: [id number] Procedure:                Colonoscopy Indications:              Screening for colorectal malignant neoplasm (no                            polyps on last colonoscopy 11/2008) Medicines:                Monitored Anesthesia Care Procedure:                Pre-Anesthesia Assessment:                           - Prior to the procedure, a History and Physical                            was performed, and patient medications and                            allergies were reviewed. The patient's tolerance of                            previous anesthesia was also reviewed. The risks                            and benefits of the procedure and the sedation                            options and risks were discussed with the patient.                            All questions were answered, and informed consent                            was obtained. Prior Anticoagulants: The patient has                            taken no previous anticoagulant or antiplatelet                            agents except for aspirin. ASA Grade Assessment: II                            - A patient with mild systemic disease. After                            reviewing the risks and benefits, the patient was                            deemed in satisfactory condition to undergo the  procedure.                           After obtaining informed consent, the colonoscope                            was passed under direct vision. Throughout the                            procedure, the patient's blood pressure, pulse, and                            oxygen saturations were monitored continuously. The                            Colonoscope was introduced through the anus and                  advanced to the the cecum, identified by                            appendiceal orifice and ileocecal valve. The                            colonoscopy was performed without difficulty. The                            patient tolerated the procedure well. The quality                            of the bowel preparation was good. The ileocecal                            valve, appendiceal orifice, and rectum were                            photographed. Scope In: 10:58:46 AM Scope Out: 11:25:28 AM Scope Withdrawal Time: 0 hours 21 minutes 15 seconds  Total Procedure Duration: 0 hours 26 minutes 42 seconds  Findings:                 The digital rectal exam findings include decreased                            sphincter tone.                           A 10 mm polyp was found in the proximal ascending                            colon. The polyp was sessile. The polyp was removed                            with a hot snare. Resection and retrieval were  complete.                           Four sessile polyps were found in the proximal                            transverse colon. The polyps were 2 to 6 mm in                            size. These polyps were removed with a cold snare.                            Resection and retrieval were complete.                           Three sessile polyps were found in the distal                            descending colon and mid transverse colon. The                            polyps were 4 to 6 mm in size. These polyps were                            removed with a cold snare. Resection and retrieval                            were complete.                           Multiple diverticula were found in the left colon.                           The exam was otherwise without abnormality on                            direct and retroflexion views. Complications:            No immediate complications. Estimated  Blood Loss:     Estimated blood loss was minimal. Impression:               - Decreased sphincter tone found on digital rectal                            exam.                           - One 10 mm polyp in the proximal ascending colon,                            removed with a hot snare. Resected and retrieved.                           - Four 2 to 6 mm polyps in the proximal transverse  colon, removed with a cold snare. Resected and                            retrieved.                           - Three 4 to 6 mm polyps in the distal descending                            colon and in the mid transverse colon, removed with                            a cold snare. Resected and retrieved.                           - Diverticulosis in the left colon.                           - The examination was otherwise normal on direct                            and retroflexion views. Recommendation:           - Patient has a contact number available for                            emergencies. The signs and symptoms of potential                            delayed complications were discussed with the                            patient. Return to normal activities tomorrow.                            Written discharge instructions were provided to the                            patient.                           - Resume previous diet.                           - Continue present medications.                           - Await pathology results.                           - Repeat colonoscopy is recommended for                            surveillance. The colonoscopy date will be  determined after pathology results from today's                            exam become available for review. Adda Stokes L. Loletha Carrow, MD 01/25/2019 11:35:12 AM This report has been signed electronically.

## 2019-01-25 NOTE — Progress Notes (Signed)
Temp-June Vitals-Courtney  Pt's states no medical or surgical changes since previsit or office visit.

## 2019-01-25 NOTE — Progress Notes (Signed)
To PACU, VSS. Report to [email or web address] 

## 2019-01-25 NOTE — Progress Notes (Signed)
Called to room to assist during endoscopic procedure.  Patient ID and intended procedure confirmed with present staff. Received instructions for my participation in the procedure from the performing physician.  

## 2019-01-25 NOTE — Patient Instructions (Signed)
Information on polyps and diverticulosis given to you today.  Await pathology results.  YOU HAD AN ENDOSCOPIC PROCEDURE TODAY AT THE [facility]:   Refer to the procedure report that was given to you for any specific questions about what was found during the examination.  If the procedure report does not answer your questions, please call your gastroenterologist to clarify.  If you requested that your care partner not be given the details of your procedure findings, then the procedure report has been included in a sealed envelope for you to review at your convenience later.  YOU SHOULD EXPECT: Some feelings of bloating in the abdomen. Passage of more gas than usual.  Walking can help get rid of the air that was put into your GI tract during the procedure and reduce the bloating. If you had a lower endoscopy (such as a colonoscopy or flexible sigmoidoscopy) you may notice spotting of blood in your stool or on the toilet paper. If you underwent a bowel prep for your procedure, you may not have a normal bowel movement for a few days.  Please Note:  You might notice some irritation and congestion in your nose or some drainage.  This is from the oxygen used during your procedure.  There is no need for concern and it should clear up in a day or so.  SYMPTOMS TO REPORT IMMEDIATELY:   Following lower endoscopy (colonoscopy or flexible sigmoidoscopy):  Excessive amounts of blood in the stool  Significant tenderness or worsening of abdominal pains  Swelling of the abdomen that is new, acute  Fever of 100F or higher   For urgent or emergent issues, a gastroenterologist can be reached at any hour by calling [phone].   DIET:  We do recommend a small meal at first, but then you may proceed to your regular diet.  Drink plenty of fluids but you should avoid alcoholic beverages for 24 hours.  ACTIVITY:  You should plan to take it easy for the rest of today and you should NOT DRIVE or use  heavy machinery until tomorrow (because of the sedation medicines used during the test).    FOLLOW UP: Our staff will call the number listed on your records 48-72 hours following your procedure to check on you and address any questions or concerns that you may have regarding the information given to you following your procedure. If we do not reach you, we will leave a message.  We will attempt to reach you two times.  During this call, we will ask if you have developed any symptoms of COVID 19. If you develop any symptoms (ie: fever, flu-like symptoms, shortness of breath, cough etc.) before then, please call [phone].  If you test positive for Covid 19 in the 2 weeks post procedure, please call and report this information to us.    If any biopsies were taken you will be contacted by phone or by letter within the next 1-3 weeks.  Please call us at [phone] if you have not heard about the biopsies in 3 weeks.    SIGNATURES/CONFIDENTIALITY: You and/or your care partner have signed paperwork which will be entered into your electronic medical record.  These signatures attest to the fact that that the information above on your After Visit Summary has been reviewed and is understood.  Full responsibility of the confidentiality of this discharge information lies with you and/or your care-partner. 

## 2019-01-27 ENCOUNTER — Telehealth: Payer: Self-pay

## 2019-01-27 NOTE — Telephone Encounter (Signed)
  Follow up Call-  Call back number 01/25/2019  Post procedure Call Back phone  # [phone]  Permission to leave phone message Yes  Some recent data might be hidden     Patient questions:  Do you have a fever, pain , or abdominal swelling? No. Pain Score  0 *  Have you tolerated food without any problems? Yes.    Have you been able to return to your normal activities? Yes.    Do you have any questions about your discharge instructions: Diet   No. Medications  No. Follow up visit  No.  Do you have questions or concerns about your Care? No.  Actions: * If pain score is 4 or above: No action needed, pain <4.  1. Have you developed a fever since your procedure? No.  2.   Have you had an respiratory symptoms (SOB or cough) since your procedure? No.  3.   Have you tested positive for COVID 19 since your procedure No.  4.   Have you had any family members/close contacts diagnosed with the COVID 19 since your procedure?  No.   If yes to any of these questions please route to Joylene John, RN and Alphonsa Gin, RN.

## 2019-01-28 ENCOUNTER — Encounter: Payer: Self-pay | Admitting: Gastroenterology

## 2019-02-01 ENCOUNTER — Encounter: Payer: Self-pay | Admitting: Pulmonary Disease

## 2019-02-01 ENCOUNTER — Ambulatory Visit: Payer: PPO | Admitting: Pulmonary Disease

## 2019-02-01 ENCOUNTER — Other Ambulatory Visit: Payer: Self-pay

## 2019-02-01 DIAGNOSIS — R0602 Shortness of breath: Secondary | ICD-10-CM

## 2019-02-01 NOTE — Patient Instructions (Signed)
Shortness of breath on exertion Bronchitis  CT scan of the chest without contrast Breathing study  Call us if any significant blood in the phlegm that you are bringing up  I will see you in the office in 4 to 6 weeks

## 2019-02-01 NOTE — Progress Notes (Signed)
Subjective:     Patient ID: Kevin [name], male   DOB: 23-Apr-1946, 73 y.o.   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 COPD (chronic obstructive pulmonary disease) (HCC)    PFTs 01-2006 mild obstruction, (+) reponse to Bronchodilators    . Hyperlipidemia   . Hypertension   . Shingles    [place], 2008  . Thyroid disease     Social History   Socioeconomic History  . Marital status: Married    Spouse name: Not on file  . Number of children: 3  . Years of education: Not on file  . Highest education level: Not on file  Occupational History  . Occupation: retired 01-2013    Employer: [employer]  Social Needs  . Financial resource strain: Not on file  . Food insecurity    Worry: Not on file    Inability: Not on file  . Transportation needs    Medical: Not on file    Non-medical: Not on file  Tobacco Use  . Smoking status: Former Smoker    Types: Cigarettes    Start date: 1960    Quit date: 05/13/2004    Years since quitting: 14.7  . Smokeless tobacco: Never Used  . Tobacco comment: used to smoke 2 ppd   Substance and Sexual Activity  . Alcohol use: Yes    Alcohol/week: 7.0 - 14.0 standard drinks    Types: 7 - 14 Cans of beer per week    Comment: 1-2 beers most days  . Drug use: No  . Sexual activity: Not Currently  Lifestyle  . Physical activity    Days per week:  Not on file    Minutes per session: Not on file  . Stress: Not on file  Relationships  . Social Herbalist on phone: Not on file    Gets together: Not on file    Attends religious service: Not on file    Active member of club or organization: Not on file    Attends meetings of clubs or organizations: Not on file    Relationship status: Not on file  . Intimate partner violence    Fear of current or ex partner: Not on file    Emotionally abused: Not on file    Physically abused: Not on file    Forced sexual activity: Not on file  Other Topics Concern  . Not on file  Social History Narrative   Lives w/ wife   3 children   Family History  Problem Relation Age of Onset  . CAD Father        F MI ~ 30 y/o  . Hypertension Mother        M and S  . Colon cancer Neg Hx   . Prostate cancer Neg Hx   .  Diabetes Neg Hx   . Rectal cancer Neg Hx   . Stomach cancer Neg Hx      Review of Systems  Constitutional: Negative for fever and unexpected weight change.  HENT: Negative for congestion, dental problem, ear pain, nosebleeds, postnasal drip, rhinorrhea, sinus pressure, sneezing, sore throat and trouble swallowing.   Eyes: Negative for redness and itching.  Respiratory: Positive for cough, shortness of breath and wheezing. Negative for chest tightness.   Cardiovascular: Negative for palpitations and leg swelling.  Gastrointestinal: Negative for nausea and vomiting.  Genitourinary: Negative for dysuria.  Musculoskeletal: Negative for joint swelling.  Skin: Negative for rash.  Allergic/Immunologic: Negative.  Negative for environmental allergies, food allergies and immunocompromised state.  Neurological: Negative for headaches.  Hematological: Does not bruise/bleed easily.  Psychiatric/Behavioral: Negative for dysphoric mood. The patient is not nervous/anxious.       Objective:   Physical Exam Constitutional:      Appearance: Normal appearance.  HENT:     Head:  Normocephalic and atraumatic.     Nose: Nose normal. No congestion.     Mouth/Throat:     Mouth: Mucous membranes are moist.  Eyes:     General:        Right eye: No discharge.        Left eye: No discharge.     Pupils: Pupils are equal, round, and reactive to light.  Neck:     Musculoskeletal: Normal range of motion and neck supple. No neck rigidity.  Cardiovascular:     Rate and Rhythm: Normal rate and regular rhythm.     Pulses: Normal pulses.     Heart sounds: No murmur.  Pulmonary:     Effort: Pulmonary effort is normal. No respiratory distress.     Breath sounds: Normal breath sounds. No stridor. No wheezing or rhonchi.  Abdominal:     General: Abdomen is flat.  Skin:    General: Skin is warm and dry.  Neurological:     General: No focal deficit present.     Mental Status: He is alert.  Psychiatric:        Mood and Affect: Mood normal.    Vitals:   02/01/19 1127  BP: 136/78  Pulse: 71  SpO2: 99%   CT scan from 2017 reviewed by myself, significant for emphysematous changes No previous PFT on record    Assessment:     Chronic obstructive pulmonary disease -Diagnosed many years ago -Significant smoking history  Bronchitis  Shortness of breath with exertion -About 25 to 30 years of exertion with a heel/steps -He was exercising regularly prior to March, tolerating up to 30 minutes on a treadmill  Questionable hemoptysis -Usually happens when he cleansed the back of his throat -Sensation of having a collection there that he brings up      Plan:     We will obtain a CT scan of the chest-significant smoking history, questionable hemoptysis  Obtain a pulmonary function test  Encouraged to get back into exercising on a regular basis  Will not change his inhalers at the present time-May require an anticholinergic  I will see him in about 4 to 6 weeks  Encouraged to call with any significant concerns

## 2019-02-12 ENCOUNTER — Encounter: Payer: Self-pay | Admitting: Gastroenterology

## 2019-02-18 ENCOUNTER — Other Ambulatory Visit: Payer: Self-pay | Admitting: Internal Medicine

## 2019-02-18 ENCOUNTER — Other Ambulatory Visit: Payer: Self-pay | Admitting: *Deleted

## 2019-02-22 ENCOUNTER — Other Ambulatory Visit: Payer: PPO

## 2019-02-27 ENCOUNTER — Other Ambulatory Visit: Payer: Self-pay | Admitting: Internal Medicine

## 2019-03-01 ENCOUNTER — Ambulatory Visit (HOSPITAL_BASED_OUTPATIENT_CLINIC_OR_DEPARTMENT_OTHER)
Admission: RE | Admit: 2019-03-01 | Discharge: 2019-03-01 | Disposition: A | Discharge status: Home / Self Care | Payer: PPO | Source: Ambulatory Visit | Attending: Internal Medicine | Admitting: Internal Medicine

## 2019-03-01 ENCOUNTER — Ambulatory Visit (INDEPENDENT_AMBULATORY_CARE_PROVIDER_SITE_OTHER): Payer: PPO | Admitting: Internal Medicine

## 2019-03-01 ENCOUNTER — Encounter: Payer: Self-pay | Admitting: Internal Medicine

## 2019-03-01 ENCOUNTER — Other Ambulatory Visit: Payer: Self-pay

## 2019-03-01 VITALS — BP 175/76 | HR 66 | Temp 97.8°F | Resp 16 | Ht 71.0 in | Wt 182.0 lb

## 2019-03-01 DIAGNOSIS — M549 Dorsalgia, unspecified: Secondary | ICD-10-CM

## 2019-03-01 DIAGNOSIS — M545 Low back pain: Secondary | ICD-10-CM | POA: Diagnosis not present

## 2019-03-01 DIAGNOSIS — R399 Unspecified symptoms and signs involving the genitourinary system: Secondary | ICD-10-CM

## 2019-03-01 DIAGNOSIS — I10 Essential (primary) hypertension: Secondary | ICD-10-CM

## 2019-03-01 LAB — POC URINALSYSI DIPSTICK (AUTOMATED)
Bilirubin, UA: NEGATIVE
Blood, UA: NEGATIVE
Glucose, UA: NEGATIVE
Ketones, UA: NEGATIVE
Leukocytes, UA: NEGATIVE
Nitrite, UA: NEGATIVE
Protein, UA: NEGATIVE
Spec Grav, UA: 1.01 (ref 1.010–1.025)
Urobilinogen, UA: 0.2 E.U./dL
pH, UA: 6 (ref 5.0–8.0)

## 2019-03-01 MED ORDER — PREDNISONE 10 MG PO TABS: ORAL_TABLET | ORAL | 0 refills | Status: DC

## 2019-03-01 MED FILL — predniSONE 10 MG TABS: 10 | 8 days supply | Qty: 20 | Fill #0

## 2019-03-01 NOTE — Progress Notes (Signed)
Pre visit review using our clinic review tool, if applicable. No additional management support is needed unless otherwise documented below in the visit note. 

## 2019-03-01 NOTE — Progress Notes (Signed)
Subjective:    Patient ID: Kevin [name], male    DOB: Jul 20, 1945, 73 y.o.   MRN: [id number]  DOS:  03/01/2019 Type of visit - description: Acute Symptoms started a week ago with pain at the distal low back pain. The pain decreases at rest when he is sitting down. Increases when he gets up or walk. When he keeps walking, the pain radiates down to the left buttock and the posterior aspect of the thigh. Denies pain in the calf, no claudication.   BP Readings from Last 3 Encounters:  03/01/19 (!) 175/76  02/01/19 136/78  01/25/19 (!) 145/70     Review of Systems No fever chills No injury or fall No abdominal pain No rash Other than the slightly increased urinary frequency, he denies dysuria/gross hematuria/difficulty urinating  Past Medical History:  Diagnosis Date  . COPD (chronic obstructive pulmonary disease) (HCC)    PFTs 01-2006 mild obstruction, (+) reponse to Bronchodilators    . Hyperlipidemia   . Hypertension   . Shingles    Thorax, 2008  . Thyroid disease     Past Surgical History:  Procedure Laterality Date  . COLONOSCOPY  11/16/2008   Sharlett Iles  . MULTIPLE TOOTH EXTRACTIONS     dentures upper and partial lower  . NO PAST SURGERIES      Social History   Socioeconomic History  . Marital status: Married    Spouse name: Not on file  . Number of children: 3  . Years of education: Not on file  . Highest education level: Not on file  Occupational History  . Occupation: retired 01-2013    Employer: [employer]  Social Needs  . Financial resource strain: Not on file  . Food insecurity    Worry: Not on file    Inability: Not on file  . Transportation needs    Medical: Not on file    Non-medical: Not on file  Tobacco Use  . Smoking status: Former Smoker    Types: Cigarettes    Start date: 1960    Quit date: 05/13/2004    Years since quitting: 14.8  . Smokeless tobacco: Never Used  . Tobacco comment: used to smoke 2 ppd   Substance and Sexual Activity   . Alcohol use: Yes    Alcohol/week: 7.0 - 14.0 standard drinks    Types: 7 - 14 Cans of beer per week    Comment: 1-2 beers most days  . Drug use: No  . Sexual activity: Not Currently  Lifestyle  . Physical activity    Days per week: Not on file    Minutes per session: Not on file  . Stress: Not on file  Relationships  . Social Herbalist on phone: Not on file    Gets together: Not on file    Attends religious service: Not on file    Active member of club or organization: Not on file    Attends meetings of clubs or organizations: Not on file    Relationship status: Not on file  . Intimate partner violence    Fear of current or ex partner: Not on file    Emotionally abused: Not on file    Physically abused: Not on file    Forced sexual activity: Not on file  Other Topics Concern  . Not on file  Social History Narrative   Lives w/ wife   3 children      Allergies as of 03/01/2019  Reactions   Bromide Ion [bromine]    Tiotropium Bromide Monohydrate Hives      Medication List       Accurate as of March 01, 2019 11:59 PM. If you have any questions, ask your nurse or doctor.        albuterol 108 (90 Base) MCG/ACT inhaler Commonly known as: Ventolin HFA Inhale 2 puffs into the lungs every 6 (six) hours as needed for wheezing or shortness of breath.   aspirin 81 MG tablet Take 81 mg by mouth at bedtime.   calcium carbonate 600 MG Tabs tablet Commonly known as: OS-CAL Take 600 mg by mouth at bedtime.   fluticasone 44 MCG/ACT inhaler Commonly known as: FLOVENT HFA Inhale 2 puffs into the lungs 2 (two) times daily as needed.   levothyroxine 75 MCG tablet Commonly known as: SYNTHROID Take 1 tablet (75 mcg total) by mouth daily before breakfast.   metoprolol succinate 50 MG 24 hr tablet Commonly known as: TOPROL-XL Take 1 tablet (50 mg total) by mouth daily. Take with or immediately following a meal.   MULTIVITAMIN PO Take 1 tablet by mouth at  bedtime.   predniSONE 10 MG tablet Commonly known as: DELTASONE 4 tablets x 2 days, 3 tabs x 2 days, 2 tabs x 2 days, 1 tab x 2 days Started by: Kathlene November, MD   simvastatin 40 MG tablet Commonly known as: ZOCOR Take 1 tablet (40 mg total) by mouth at bedtime.   VITAMIN B 12 PO Take 1,000 mcg by mouth daily.   vitamin E 200 UNIT capsule Generic drug: vitamin E Take 200 Units by mouth at bedtime.           Objective:   Physical Exam BP (!) 175/76 (BP Location: Left Arm, Patient Position: Sitting, Cuff Size: Small)   Pulse 66   Temp 97.8 F (36.6 C) (Temporal)   Resp 16   Ht 5\' 11"  (1.803 m)   Wt 182 lb (82.6 kg)   SpO2 99%   BMI 25.38 kg/m  General:   Well developed, NAD, BMI noted.  HEENT:  Normocephalic . Face symmetric, atraumatic  Abdomen:  Not distended, soft, non-tender. No rebound or rigidity.  No pulsatile mass, no bruit. Upper extremities: Femoral pulses: Normal No edema, calves symmetric Skin: Not pale. Not jaundice MSK: No TTP at the lumbar sacral spine or SI joints Neurologic:  alert & oriented X3.  Speech normal, gait and posture slightly antalgic particularly when he transfers and lays down in the examining table. Motor and DTRs symmetric Straight leg test negative. Psych--  Cognition and judgment appear intact.  Cooperative with normal attention span and concentration.  Behavior appropriate. No anxious or depressed appearing.     Assessment     Assessment Hyperglycemia HTN Hypothyroidism Hyperlipidemia LUTS COPD: PFTs 2007 mild obstruction, + response to bronchodilators, h/o multiple inhalers intolerance  Shingles 2008, chest.  PLAN: Acute lumbalgia: As described above, Udip (-),  suspect pain is MSK in origin,  no obvious radiculopathy on clinical grounds. Plan: Instead of  ibuprofen use Tylenol Prednisone Heating pad X-ray Call if not gradually better. HTN: Good med compliance with Toprol.  BP probably elevated due to pain,  recommend to check daily for few days, call if not at goal.

## 2019-03-01 NOTE — Patient Instructions (Addendum)
   STOP BY THE FIRST FLOOR:  get the XR    For pain:  Take prednisone as prescribed for few days  Tylenol  500 mg OTC 2 tabs a day every 8 hours as needed for pain  Put a heating pad twice a day  Rest  Call if not gradually better, call anytime if you have severe pain or the pain lasts more than 1 week.   Check the  blood pressure   daily BP GOAL is between 110/65 and  145/85. If it is consistently higher or lower, let me know

## 2019-03-02 NOTE — Assessment & Plan Note (Signed)
Acute lumbalgia: As described above, Udip (-),  suspect pain is MSK in origin,  no obvious radiculopathy on clinical grounds. Plan: Instead of  ibuprofen use Tylenol Prednisone Heating pad X-ray Call if not gradually better. HTN: Good med compliance with Toprol.  BP probably elevated due to pain, recommend to check daily for few days, call if not at goal.

## 2019-03-05 ENCOUNTER — Other Ambulatory Visit (HOSPITAL_COMMUNITY)
Admission: RE | Admit: 2019-03-05 | Discharge: 2019-03-05 | Disposition: A | Discharge status: Home / Self Care | Payer: PPO | Source: Ambulatory Visit | Attending: Pulmonary Disease | Admitting: Pulmonary Disease

## 2019-03-05 DIAGNOSIS — Z20828 Contact with and (suspected) exposure to other viral communicable diseases: Secondary | ICD-10-CM | POA: Insufficient documentation

## 2019-03-05 DIAGNOSIS — Z01812 Encounter for preprocedural laboratory examination: Secondary | ICD-10-CM | POA: Diagnosis not present

## 2019-03-06 LAB — NOVEL CORONAVIRUS, NAA (HOSP ORDER, SEND-OUT TO REF LAB; TAT 18-24 HRS): SARS-CoV-2, NAA: NOT DETECTED

## 2019-03-09 ENCOUNTER — Other Ambulatory Visit: Payer: Self-pay

## 2019-03-09 ENCOUNTER — Ambulatory Visit (INDEPENDENT_AMBULATORY_CARE_PROVIDER_SITE_OTHER)
Admission: RE | Admit: 2019-03-09 | Discharge: 2019-03-09 | Disposition: A | Discharge status: Home / Self Care | Payer: PPO | Source: Ambulatory Visit | Attending: Pulmonary Disease | Admitting: Pulmonary Disease

## 2019-03-09 DIAGNOSIS — R0602 Shortness of breath: Secondary | ICD-10-CM

## 2019-03-10 ENCOUNTER — Ambulatory Visit (INDEPENDENT_AMBULATORY_CARE_PROVIDER_SITE_OTHER): Payer: PPO | Admitting: Pulmonary Disease

## 2019-03-10 ENCOUNTER — Ambulatory Visit: Payer: PPO | Admitting: Pulmonary Disease

## 2019-03-10 ENCOUNTER — Encounter: Payer: Self-pay | Admitting: Pulmonary Disease

## 2019-03-10 VITALS — BP 116/84 | HR 63 | Temp 97.6°F | Ht 69.0 in | Wt 179.0 lb

## 2019-03-10 DIAGNOSIS — J410 Simple chronic bronchitis: Secondary | ICD-10-CM

## 2019-03-10 DIAGNOSIS — R9389 Abnormal findings on diagnostic imaging of other specified body structures: Secondary | ICD-10-CM

## 2019-03-10 DIAGNOSIS — R0602 Shortness of breath: Secondary | ICD-10-CM

## 2019-03-10 DIAGNOSIS — J432 Centrilobular emphysema: Secondary | ICD-10-CM

## 2019-03-10 LAB — PULMONARY FUNCTION TEST
DL/VA % pred: 94 %
DL/VA: 3.75 ml/min/mmHg/L
DLCO unc % pred: 100 %
DLCO unc: 24.39 ml/min/mmHg
FEF 25-75 Post: 2.16 L/sec
FEF 25-75 Pre: 1.81 L/sec
FEF2575-%Change-Post: 19 %
FEF2575-%Pred-Post: 101 %
FEF2575-%Pred-Pre: 84 %
FEV1-%Change-Post: 3 %
FEV1-%Pred-Post: 93 %
FEV1-%Pred-Pre: 90 %
FEV1-Post: 2.75 L
FEV1-Pre: 2.65 L
FEV1FVC-%Change-Post: -3 %
FEV1FVC-%Pred-Pre: 97 %
FEV6-%Change-Post: 7 %
FEV6-%Pred-Post: 103 %
FEV6-%Pred-Pre: 96 %
FEV6-Post: 3.96 L
FEV6-Pre: 3.69 L
FEV6FVC-%Change-Post: 0 %
FEV6FVC-%Pred-Post: 105 %
FEV6FVC-%Pred-Pre: 105 %
FVC-%Change-Post: 7 %
FVC-%Pred-Post: 98 %
FVC-%Pred-Pre: 91 %
FVC-Post: 4 L
FVC-Pre: 3.72 L
Post FEV1/FVC ratio: 69 %
Post FEV6/FVC ratio: 99 %
Pre FEV1/FVC ratio: 71 %
Pre FEV6/FVC Ratio: 99 %
RV % pred: 133 %
RV: 3.33 L
TLC % pred: 106 %
TLC: 7.28 L

## 2019-03-10 NOTE — Progress Notes (Signed)
Full PFT performed today. °

## 2019-03-10 NOTE — Patient Instructions (Signed)
Emphysema on CT scan of the chest Breathing study shows mild obstruction  With stable symptoms, able to tolerate activities well Use of rescue inhaler as needed is appropriate  I will see you back in 6 months  Call with any significant concerns

## 2019-03-10 NOTE — Progress Notes (Signed)
Subjective:     Patient ID: Kevin [name], male   DOB: 1945-07-09, 73 y.o.   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 COPD (chronic obstructive pulmonary disease) (HCC)    PFTs 01-2006 mild obstruction, (+) reponse to Bronchodilators    . Hyperlipidemia   . Hypertension   . Shingles    Thorax, 2008  . Thyroid disease     Social History   Socioeconomic History  . Marital status: Married    Spouse name: Not on file  . Number of children: 3  . Years of education: Not on file  . Highest education level: Not on file  Occupational History  . Occupation: retired 01-2013    Employer: [employer]  Social Needs  . Financial resource strain: Not on file  . Food insecurity    Worry: Not on file    Inability: Not on file  . Transportation needs    Medical: Not on file    Non-medical: Not on file  Tobacco Use  . Smoking status: Former Smoker    Types: Cigarettes    Start date: 1960    Quit date: 05/13/2004    Years since quitting: 14.8  . Smokeless tobacco: Never Used  . Tobacco comment: used to smoke 2 ppd   Substance and Sexual Activity  . Alcohol use: Yes    Alcohol/week:  7.0 - 14.0 standard drinks    Types: 7 - 14 Cans of beer per week    Comment: 1-2 beers most days  . Drug use: No  . Sexual activity: Not Currently  Lifestyle  . Physical activity    Days per week: Not on file    Minutes per session: Not on file  . Stress: Not on file  Relationships  . Social Herbalist on phone: Not on file    Gets together: Not on file    Attends religious service: Not on file    Active member of club or organization: Not on file    Attends meetings of clubs or organizations: Not on file    Relationship status: Not on file  . Intimate partner violence    Fear of current or ex partner: Not on file    Emotionally abused: Not on file    Physically abused: Not on file    Forced sexual activity: Not on file  Other Topics Concern  . Not on file  Social History Narrative   Lives w/ wife   3 children   Family History  Problem Relation Age of Onset  . CAD Father  F MI ~ 46 y/o  . Hypertension Mother        M and S  . Colon cancer Neg Hx   . Prostate cancer Neg Hx   . Diabetes Neg Hx   . Rectal cancer Neg Hx   . Stomach cancer Neg Hx      Review of Systems  Constitutional: Negative for fever and unexpected weight change.  HENT: Negative for congestion, dental problem, ear pain, nosebleeds, postnasal drip, rhinorrhea, sinus pressure, sneezing, sore throat and trouble swallowing.   Eyes: Negative for redness and itching.  Respiratory: Positive for shortness of breath and wheezing. Negative for cough and chest tightness.   Cardiovascular: Negative for palpitations and leg swelling.  Gastrointestinal: Negative for nausea and vomiting.  Genitourinary: Negative for dysuria.  Musculoskeletal: Negative for joint swelling.  Skin: Negative for rash.  Allergic/Immunologic: Negative.  Negative for environmental allergies, food allergies and immunocompromised state.  Neurological: Negative for headaches.  Hematological: Does not bruise/bleed easily.   Psychiatric/Behavioral: Negative for dysphoric mood. The patient is not nervous/anxious.       Objective:   Physical Exam Constitutional:      Appearance: Normal appearance.  HENT:     Head: Normocephalic and atraumatic.     Nose: Nose normal. No congestion.     Mouth/Throat:     Mouth: Mucous membranes are moist.  Eyes:     General:        Right eye: No discharge.        Left eye: No discharge.     Pupils: Pupils are equal, round, and reactive to light.  Neck:     Musculoskeletal: Normal range of motion and neck supple. No neck rigidity.  Cardiovascular:     Rate and Rhythm: Normal rate and regular rhythm.     Pulses: Normal pulses.     Heart sounds: No murmur.  Pulmonary:     Effort: Pulmonary effort is normal. No respiratory distress.     Breath sounds: Normal breath sounds. No stridor. No wheezing or rhonchi.  Abdominal:     General: Abdomen is flat.  Neurological:     Mental Status: He is alert.    Vitals:   03/10/19 1212  BP: 116/84  Pulse: 63  Temp: 97.6 F (36.4 C)  SpO2: 95%   CT scan of the chest reviewed showing emphysema-mild PFT shows mild obstructive lung disease-reviewed with the patient    Assessment:     Chronic obstructive pulmonary disease -Diagnosed many years ago -Significant smoking history -Denies significant ongoing symptoms at present -Able to maintain current activity levels  Bronchitis -Does not appear to have any acute exacerbation at present  Shortness of breath with exertion -About 25 to 30 years of exertion with a heel/steps -Back to exercising more unable to tolerate it well  Questionable hemoptysis -No recurrence of questionable hemoptysis      Plan:    Encouraged to continue exercising on a regular basis  Continue rescue inhaler use as needed  I will see him back in the office in about 6 months  Encouraged to call with any significant concerns

## 2019-03-18 ENCOUNTER — Other Ambulatory Visit: Payer: Self-pay

## 2019-03-19 ENCOUNTER — Encounter: Payer: Self-pay | Admitting: Internal Medicine

## 2019-03-19 ENCOUNTER — Ambulatory Visit (INDEPENDENT_AMBULATORY_CARE_PROVIDER_SITE_OTHER): Payer: PPO | Admitting: Internal Medicine

## 2019-03-19 VITALS — BP 179/88 | HR 65 | Temp 96.6°F | Resp 16 | Ht 69.0 in | Wt 180.2 lb

## 2019-03-19 DIAGNOSIS — J410 Simple chronic bronchitis: Secondary | ICD-10-CM

## 2019-03-19 DIAGNOSIS — I10 Essential (primary) hypertension: Secondary | ICD-10-CM

## 2019-03-19 MED ORDER — TETANUS-DIPHTH-ACELL PERTUSSIS 5-2.5-18.5 LF-MCG/0.5 IM SUSP: 0.5000 mL | Freq: Once | INTRAMUSCULAR | 0 refills | Status: AC

## 2019-03-19 NOTE — Progress Notes (Signed)
Subjective:    Patient ID: Kevin [name], male    DOB: [birth year], 73 y.o.   MRN: [id number]  DOS:  03/19/2019 Type of visit - description: Follow-up COPD: Since the last office visit, he saw pulmonary, note reviewed Preventive care: Due for a Tdap Labs reviewed, all very good Low back pain: Essentially resolved.   BP elevated today, typically better at home, could not tell me readings.  BP Readings from Last 3 Encounters:  03/19/19 (!) 179/88  03/10/19 116/84  03/01/19 (!) 175/76      Review of Systems Feels well. No chest pain Shortness of breath and cough >>> at baseline.   Past Medical History:  Diagnosis Date  . COPD (chronic obstructive pulmonary disease) (HCC)    PFTs 01-2006 mild obstruction, (+) reponse to Bronchodilators    . Hyperlipidemia   . Hypertension   . Shingles    Thorax, 2008  . Thyroid disease     Past Surgical History:  Procedure Laterality Date  . COLONOSCOPY  11/16/2008   Sharlett Iles  . MULTIPLE TOOTH EXTRACTIONS     dentures upper and partial lower  . NO PAST SURGERIES      Social History   Socioeconomic History  . Marital status: Married    Spouse name: Not on file  . Number of children: 3  . Years of education: Not on file  . Highest education level: Not on file  Occupational History  . Occupation: retired 01-2013    Employer: [employer]  Social Needs  . Financial resource strain: Not on file  . Food insecurity    Worry: Not on file    Inability: Not on file  . Transportation needs    Medical: Not on file    Non-medical: Not on file  Tobacco Use  . Smoking status: Former Smoker    Types: Cigarettes    Start date: 1960    Quit date: 05/13/2004    Years since quitting: 14.8  . Smokeless tobacco: Never Used  . Tobacco comment: used to smoke 2 ppd   Substance and Sexual Activity  . Alcohol use: Yes    Alcohol/week: 7.0 - 14.0 standard drinks    Types: 7 - 14 Cans of beer per week    Comment: 1-2 beers most days  . Drug use:  No  . Sexual activity: Not Currently  Lifestyle  . Physical activity    Days per week: Not on file    Minutes per session: Not on file  . Stress: Not on file  Relationships  . Social Herbalist on phone: Not on file    Gets together: Not on file    Attends religious service: Not on file    Active member of club or organization: Not on file    Attends meetings of clubs or organizations: Not on file    Relationship status: Not on file  . Intimate partner violence    Fear of current or ex partner: Not on file    Emotionally abused: Not on file    Physically abused: Not on file    Forced sexual activity: Not on file  Other Topics Concern  . Not on file  Social History Narrative   Lives w/ wife   3 children      Allergies as of 03/19/2019      Reactions   Bromide Ion [bromine]    Tiotropium Bromide Monohydrate Hives      Medication List  Accurate as of March 19, 2019 11:59 PM. If you have any questions, ask your nurse or doctor.        STOP taking these medications   predniSONE 10 MG tablet Commonly known as: DELTASONE Stopped by: Kathlene November, MD     TAKE these medications   albuterol 108 (90 Base) MCG/ACT inhaler Commonly known as: Ventolin HFA Inhale 2 puffs into the lungs every 6 (six) hours as needed for wheezing or shortness of breath.   aspirin 81 MG tablet Take 81 mg by mouth at bedtime.   calcium carbonate 600 MG Tabs tablet Commonly known as: OS-CAL Take 600 mg by mouth at bedtime.   fluticasone 44 MCG/ACT inhaler Commonly known as: FLOVENT HFA Inhale 2 puffs into the lungs 2 (two) times daily as needed.   levothyroxine 75 MCG tablet Commonly known as: SYNTHROID Take 1 tablet (75 mcg total) by mouth daily before breakfast.   metoprolol succinate 50 MG 24 hr tablet Commonly known as: TOPROL-XL Take 1 tablet (50 mg total) by mouth daily. Take with or immediately following a meal.   MULTIVITAMIN PO Take 1 tablet by mouth at  bedtime.   simvastatin 40 MG tablet Commonly known as: ZOCOR Take 1 tablet (40 mg total) by mouth at bedtime.   Tdap 5-2.5-18.5 LF-MCG/0.5 injection Commonly known as: BOOSTRIX Inject 0.5 mLs into the muscle once for 1 dose. Started by: Kathlene November, MD   VITAMIN B 12 PO Take 1,000 mcg by mouth daily.   vitamin E 200 UNIT capsule Generic drug: vitamin E Take 200 Units by mouth at bedtime.           Objective:   Physical Exam BP (!) 179/88 (BP Location: Left Arm, Patient Position: Sitting, Cuff Size: Small)   Pulse 65   Temp (!) 96.6 F (35.9 C) (Temporal)   Resp 16   Ht 5\' 9"  (1.753 m)   Wt 180 lb 4 oz (81.8 kg)   SpO2 100%   BMI 26.62 kg/m  General:   Well developed, NAD, BMI noted. HEENT:  Normocephalic . Face symmetric, atraumatic Lungs:  Creased breath sounds but clear Normal respiratory effort, no intercostal retractions, no accessory muscle use. Heart: RRR,  no murmur.  No pretibial edema bilaterally  Skin: Not pale. Not jaundice Neurologic:  alert & oriented X3.  Speech normal, gait appropriate for age and unassisted Psych--  Cognition and judgment appear intact.  Cooperative with normal attention span and concentration.  Behavior appropriate. No anxious or depressed appearing.      Assessment    Assessment Hyperglycemia HTN Hypothyroidism Hyperlipidemia LUTS COPD: PFTs 2007 mild obstruction, + response to bronchodilators, h/o multiple inhalers intolerance  Shingles 2008, chest.    PLAN: HTN: BP elevated today, was very good when he saw pulmonology not long ago, reports ambulatory BPs at home are okay. Plan: Continue metoprolol, check ambulatory BPs at home, goal is less than 135/85 consistently.  Patient verbalized understanding. COPD: When I saw him 12-2018, he had increased DOE, hemoptysis.  Saw pulmonary 02-2019 and at that time he was feeling better.  PFTs were done Preventive care: Tdap prescription printed RTC 6 months CPX

## 2019-03-19 NOTE — Patient Instructions (Signed)
  GO TO THE FRONT DESK Schedule your next appointment   for a physical exam in 6 months, call sooner if needed

## 2019-03-19 NOTE — Progress Notes (Signed)
Pre visit review using our clinic review tool, if applicable. No additional management support is needed unless otherwise documented below in the visit note. 

## 2019-03-20 NOTE — Assessment & Plan Note (Signed)
HTN: BP elevated today, was very good when he saw pulmonology not long ago, reports ambulatory BPs at home are okay. Plan: Continue metoprolol, check ambulatory BPs at home, goal is less than 135/85 consistently.  Patient verbalized understanding. COPD: When I saw him 12-2018, he had increased DOE, hemoptysis.  Saw pulmonary 02-2019 and at that time he was feeling better.  PFTs were done Preventive care: Tdap prescription printed RTC 6 months CPX

## 2019-06-03 ENCOUNTER — Ambulatory Visit: Payer: PPO | Attending: Internal Medicine

## 2019-06-03 DIAGNOSIS — Z23 Encounter for immunization: Secondary | ICD-10-CM | POA: Insufficient documentation

## 2019-06-03 NOTE — Progress Notes (Signed)
   Covid-19 Vaccination Clinic  Name:  Kevin [name]    MRN: [id number] DOB: [birth year]  06/03/2019  Kevin [name] was observed post Covid-19 immunization for 15 minutes without incidence. He was provided with Vaccine Information Sheet and instruction to access the V-Safe system.   Kevin [name] was instructed to call 911 with any severe reactions post vaccine: Marland Kitchen Difficulty breathing  . Swelling of your face and throat  . A fast heartbeat  . A bad rash all over your body  . Dizziness and weakness    Immunizations Administered    Name Date Dose VIS Date Route   Pfizer COVID-19 Vaccine 06/03/2019  1:33 PM 0.3 mL 04/23/2019 Intramuscular   Manufacturer: [place]   Lot: [id number]   [place]: [id number]

## 2019-06-21 ENCOUNTER — Ambulatory Visit: Payer: PPO | Attending: Internal Medicine

## 2019-06-24 ENCOUNTER — Ambulatory Visit: Payer: PPO | Attending: Internal Medicine

## 2019-06-24 DIAGNOSIS — Z23 Encounter for immunization: Secondary | ICD-10-CM

## 2019-06-24 NOTE — Progress Notes (Signed)
   Covid-19 Vaccination Clinic  Name:  Kevin [name]    MRN: [id number] DOB: 1945-10-06  06/24/2019  [name] was observed post Covid-19 immunization for 15 minutes without incidence. He was provided with Vaccine Information Sheet and instruction to access the V-Safe system.   [name] was instructed to call 911 with any severe reactions post vaccine: Marland Kitchen Difficulty breathing  . Swelling of your face and throat  . A fast heartbeat  . A bad rash all over your body  . Dizziness and weakness    Immunizations Administered    Name Date Dose VIS Date Route   Pfizer COVID-19 Vaccine 06/24/2019 10:58 AM 0.3 mL 04/23/2019 Intramuscular   Manufacturer: [place]   Lot: [id number]   [place]: [id number]

## 2019-07-01 ENCOUNTER — Telehealth: Payer: Self-pay | Admitting: Internal Medicine

## 2019-07-01 NOTE — Chronic Care Management (AMB) (Signed)
  Chronic Care Management   Note  07/01/2019 Name: Kevin [name] MRN: [id number] DOB: 1946/04/25  Kevin [name] is a 74 y.o. year old male who is a primary care patient of Colon Branch, MD. I reached out to [name] by phone today in response to a referral sent by Kevin [name] PCP, Colon Branch, MD.   Kevin [name] was given information about Chronic Care Management services today including:  1. CCM service includes personalized support from designated clinical staff supervised by his physician, including individualized plan of care and coordination with other care providers 2. 24/7 contact phone numbers for assistance for urgent and routine care needs. 3. Service will only be billed when office clinical staff spend 20 minutes or more in a month to coordinate care. 4. Only one practitioner may furnish and bill the service in a calendar month. 5. The patient may stop CCM services at any time (effective at the end of the month) by phone call to the office staff. 6. The patient will be responsible for cost sharing (co-pay) of up to 20% of the service fee (after annual deductible is met).  Patient agreed to services and verbal consent obtained.   Follow up plan:   Raynicia Dukes [employer] Scheduler

## 2019-07-12 ENCOUNTER — Telehealth: Payer: PPO

## 2019-07-16 ENCOUNTER — Other Ambulatory Visit: Payer: Self-pay

## 2019-07-16 DIAGNOSIS — E7849 Other hyperlipidemia: Secondary | ICD-10-CM

## 2019-07-16 DIAGNOSIS — I10 Essential (primary) hypertension: Secondary | ICD-10-CM

## 2019-07-16 DIAGNOSIS — J410 Simple chronic bronchitis: Secondary | ICD-10-CM

## 2019-07-16 DIAGNOSIS — E038 Other specified hypothyroidism: Secondary | ICD-10-CM

## 2019-07-16 DIAGNOSIS — E039 Hypothyroidism, unspecified: Secondary | ICD-10-CM

## 2019-07-16 NOTE — Chronic Care Management (AMB) (Signed)
Chronic Care Management Pharmacy  Name: Kevin [name]  MRN: [id number] DOB: 09/24/1945  Chief Complaint/ HPI  Kevin [name],  74 y.o. , male presents for their Initial CCM visit with the clinical pharmacist via telephone due to COVID-19 Pandemic.  PCP : Kevin Branch, MD  Their chronic conditions include: COPD, Pre-DM, HTN, HLD, Hypothyroidism, Edema  Office Visits: 03/19/19: Visit w/ Dr. Larose [name] - BP elevated, pt reported home BP more controlled. Back pain improved. No med changes noted. PFTs with pulmonology completed 02/2019. Prescription for TDap given  03/01/19: Visit w/ Dr. Larose [name] - Low back pain. Urine dip negative. Recommended use of tylenol vs ibuprofen plus prednisone, heating pad and xray  Consult Visit: 03/10/19: Pulmonology visit w/ Dr. Ander [name] - SOB on exertion after 25-30 yards. Reports cough and occasional wheezing. Using Flovent as needed. No acute exacerbation noted. No med changes noted.   03/05/19: COVID Test negative  02/01/19: Pulmonology visit w/ Dr. Ander [name] - SOB. Ordered CT scan of chest w/o contrast + breathing study. Follow up in 4-6 weeks. No med changes noted  01/25/19: Gastro colonoscopy w/ Dr. Loletha [name] - Decreased sphincter tone found on digital rectal exam. Polyps found and removed. Diverticulosis in the left Kevin. Adenoma w/o high grade dysplasia or malignancy  Medications: Outpatient Encounter Medications as of 07/19/2019  Medication Sig  . aspirin 81 MG tablet Take 81 mg by mouth at bedtime.   . calcium carbonate (OS-CAL) 600 MG TABS tablet Take 600 mg by mouth at bedtime.   . Cyanocobalamin (VITAMIN B 12 PO) Take 1,000 mcg by mouth daily.  . fluticasone (FLOVENT HFA) 44 MCG/ACT inhaler Inhale 2 puffs into the lungs 2 (two) times daily as needed.  Marland Kitchen levothyroxine (SYNTHROID) 75 MCG tablet Take 1 tablet (75 mcg total) by mouth daily before breakfast.  . metoprolol succinate (TOPROL-XL) 50 MG 24 hr tablet Take 1 tablet (50 mg total) by mouth daily. Take with or  immediately following a meal.  . Multiple Vitamins-Minerals (MULTIVITAMIN PO) Take 1 tablet by mouth at bedtime.   . simvastatin (ZOCOR) 40 MG tablet Take 1 tablet (40 mg total) by mouth at bedtime.  . vitamin E (VITAMIN E) 200 UNIT capsule Take 200 Units by mouth at bedtime.   Marland Kitchen albuterol (VENTOLIN HFA) 108 (90 Base) MCG/ACT inhaler Inhale 2 puffs into the lungs every 6 (six) hours as needed for wheezing or shortness of breath. (Patient not taking: Reported on 07/19/2019)   No facility-administered encounter medications on file as of 07/19/2019.   Immunization History  Administered Date(s) Administered  . Fluad Quad(high Dose 65+) 01/15/2019  . Influenza Split 05/22/2012  . Influenza, High Dose Seasonal PF 02/10/2013, 03/11/2016  . Influenza,inj,Quad PF,6+ Mos 02/21/2014  . Influenza-Unspecified 02/11/2017, 02/10/2018  . PFIZER SARS-COV-2 Vaccination 06/03/2019, 06/24/2019  . Pneumococcal Conjugate-13 03/23/2014  . Pneumococcal Polysaccharide-23 04/15/2007, 04/20/2013  . Td 05/13/1998, 08/01/2008  . Zoster 09/14/2013  . Zoster Recombinat (Shingrix) 01/15/2019     Current Diagnosis/Assessment:  Goals Addressed            This Visit's Progress   . A1c goal less than 6.5%       -Pre-Diabetes a1c range is 5.7% to 6.4%. Your most recent a1c was 5.7% on 12/18/2018.  -Usually the full diabetes diagnosis is given when a1c reaches 6.5% or higher.      . Blood pressure goal less than 140/90      . Check blood pressure 1 to 3 times per week and record  readings      . Pharmacy Care Plan       CARE PLAN ENTRY  Current Barriers:  . Chronic Disease Management support, education, and care coordination needs related to COPD, Pre-DM, HTN, HLD, Hypothyroidism, Edema  Pharmacist Clinical Goal(s):  Marland Kitchen A1c goal <6.5% . LDL goal <100 . Blood pressure goal <140/90  Interventions: . Comprehensive medication review performed. . Check blood pressure 1-3 times daily and record readings  Patient  Self Care Activities:  . Patient verbalizes understanding of plan to follow as described above, Self administers medications as prescribed, Calls pharmacy for medication refills, and Calls provider office for new concerns or questions  Initial goal documentation       Social Hx:  Married x 31 years in August. Daughter in [place] that wants her to move that way. Son in [place]. 5 grandkids.   COPD / Tobacco   Last spirometry score: None noted   Gold Grade: Unable to assess Current COPD Classification:  A (low sx, <2 exacerbations/yr)  Eosinophil count:   Lab Results  Component Value Date/Time   EOSPCT 6.5 (H) 12/18/2018 09:58 AM  %                               Eos (Absolute):  Lab Results  Component Value Date/Time   EOSABS 0.3 12/18/2018 09:58 AM    Tobacco Status:  Social History   Tobacco Use  Smoking Status Former Smoker  . Types: Cigarettes  . Start date: 54  . Quit date: 05/13/2004  . Years since quitting: 15.2  Smokeless Tobacco Never Used  Tobacco Comment   used to smoke 2 ppd     Patient has failed these meds in past: symbicort, spiriva (???) Patient is currently controlled on the following medications: Ventolin PRN, flovent 41mcg 2 puffs BID Using maintenance inhaler regularly? No Frequency of rescue inhaler use:  infrequently (once a month)   Uses Flovent as needed. Last used last month. Uses about once a month Was told by pulmonologist to only use flovent PRN vs daily due to how well his lung function was Does treadmill about one Analysa Nutting a week for a while (Monday)   Plan -Continue current medications  Pre-Diabetes   Recent Relevant Labs: Lab Results  Component Value Date/Time   HGBA1C 5.7 12/18/2018 09:58 AM   HGBA1C 5.9 12/12/2017 09:43 AM     Patient has failed these meds in past: None noted Patient is currently controlled on the following medications: None  We discussed: diet and exercise extensively  Plan  Continue control with  diet and exercise    Hypertension   CMP Latest Ref Rng & Units 12/18/2018 12/12/2017 12/10/2016  Glucose 70 - 99 mg/dL 93 100(H) 94  BUN 6 - 23 mg/dL 14 17 13   Creatinine 0.40 - 1.50 mg/dL 1.05 0.97 0.93  Sodium 135 - 145 mEq/L 140 139 138  Potassium 3.5 - 5.1 mEq/L 5.2 No hemolysis seen(H) 5.2(H) 4.6  Chloride 96 - 112 mEq/L 102 101 101  CO2 19 - 32 mEq/L 31 33(H) 31  Calcium 8.4 - 10.5 mg/dL 9.8 9.7 9.3  Total Protein 6.0 - 8.3 g/dL 7.1 7.3 7.0  Total Bilirubin 0.2 - 1.2 mg/dL 0.5 0.6 0.5  Alkaline Phos 39 - 117 U/L 52 53 47  AST 0 - 37 U/L 18 15 17   ALT 0 - 53 U/L 18 16 19   GFR  68.83   80.38   84.62  BP today is:  <130/80  128/77 67 (at the gym after working out)  Office blood pressures are  BP Readings from Last 3 Encounters:  03/19/19 (!) 179/88  03/10/19 116/84  03/01/19 (!) 175/76   BP goal <140/90  Patient has failed these meds in the past: None  Patient is currently uncontrolled on the following medications: metoprolol succinate 50mg  daily  Patient checks BP at home weekly  We discussed [place] blood pressure measurement technique   Plan -Check blood pressure 1 to 3 times per week and record readings -Continue current medications     Hyperlipidemia   Lipid Panel     Component Value Date/Time   CHOL 149 12/18/2018 0958   TRIG 115.0 12/18/2018 0958   HDL 53.50 12/18/2018 0958   CHOLHDL 3 12/18/2018 0958   VLDL 23.0 12/18/2018 0958   LDLCALC 73 12/18/2018 0958   LDLDIRECT 162.2 08/01/2008 1513     ASCVD 10-year risk: 41.5% (taking aspirin 81mg )  LDL goal <100  Patient has failed these meds in past: None noted Patient is currently controlled on the following medications: simvastatin 40mg  daily Denies side effects  Plan -Continue current medications   Hypothyroidism   TSH  Date Value Ref Range Status  12/18/2018 3.57 0.35 - 4.50 uIU/mL Final     No components found for: T4  Patient has failed these meds in past: None noted Patient is  currently controlled on the following medications: levothyroxine 26mcg daily   Plan -Continue current medications   Edema    Patient has failed these meds in past: None noted  Patient is currently controlled on the following medications: None  "Doesn't bother me"   Plan -Continue current management  Miscellaneous B12 10110mcg daily (12/12/2017 B12 = 339) Calcium carbonate 600mg  daily Vitamin E 200 units daily multivitamin

## 2019-07-19 ENCOUNTER — Other Ambulatory Visit: Payer: Self-pay

## 2019-07-19 ENCOUNTER — Ambulatory Visit: Payer: PPO | Admitting: Pharmacist

## 2019-07-19 DIAGNOSIS — R739 Hyperglycemia, unspecified: Secondary | ICD-10-CM

## 2019-07-19 DIAGNOSIS — J410 Simple chronic bronchitis: Secondary | ICD-10-CM

## 2019-07-19 DIAGNOSIS — I10 Essential (primary) hypertension: Secondary | ICD-10-CM

## 2019-07-19 DIAGNOSIS — E039 Hypothyroidism, unspecified: Secondary | ICD-10-CM

## 2019-07-19 DIAGNOSIS — R609 Edema, unspecified: Secondary | ICD-10-CM

## 2019-07-19 DIAGNOSIS — E038 Other specified hypothyroidism: Secondary | ICD-10-CM

## 2019-07-19 DIAGNOSIS — E7849 Other hyperlipidemia: Secondary | ICD-10-CM

## 2019-07-29 NOTE — Patient Instructions (Signed)
Visit Information  Goals Addressed            This Visit's Progress   . A1c goal less than 6.5%       -Pre-Diabetes a1c range is 5.7% to 6.4%. Your most recent a1c was 5.7% on 12/18/2018.  -Usually the full diabetes diagnosis is given when a1c reaches 6.5% or higher.      . Blood pressure goal less than 140/90      . Check blood pressure 1 to 3 times per week and record readings      . Pharmacy Care Plan       CARE PLAN ENTRY  Current Barriers:  . Chronic Disease Management support, education, and care coordination needs related to COPD, Pre-DM, HTN, HLD, Hypothyroidism, Edema  Pharmacist Clinical Goal(s):  Marland Kitchen A1c goal <6.5% . LDL goal <100 . Blood pressure goal <140/90  Interventions: . Comprehensive medication review performed. . Check blood pressure 1-3 times daily and record readings  Patient Self Care Activities:  . Patient verbalizes understanding of plan to follow as described above, Self administers medications as prescribed, Calls pharmacy for medication refills, and Calls provider office for new concerns or questions  Initial goal documentation        [name] was given information about Chronic Care Management services today including:  1. CCM service includes personalized support from designated clinical staff supervised by his physician, including individualized plan of care and coordination with other care providers 2. 24/7 contact phone numbers for assistance for urgent and routine care needs. 3. Standard insurance, coinsurance, copays and deductibles apply for chronic care management only during months in which we provide at least 20 minutes of these services. Most insurances cover these services at 100%, however patients may be responsible for any copay, coinsurance and/or deductible if applicable. This service may help you avoid the need for more expensive face-to-face services. 4. Only one practitioner may furnish and bill the service in a calendar  month. 5. The patient may stop CCM services at any time (effective at the end of the month) by phone call to the office staff.  Patient agreed to services and verbal consent obtained.   The patient verbalized understanding of instructions provided today and agreed to receive a mailed copy of patient instruction and/or educational materials. Telephone follow up appointment with pharmacy team member scheduled for: 01/18/2020  Melvenia Beam Heidemarie Goodnow, PharmD Clinical Pharmacist [facility] [phone]   Blood Pressure Record Sheet To take your blood pressure, you will need a blood pressure machine. You can buy a blood pressure machine (blood pressure monitor) at your clinic, drug store, or online. When choosing one, consider:  An automatic monitor that has an arm cuff.  A cuff that wraps snugly around your upper arm. You should be able to fit only one finger between your arm and the cuff.  A device that stores blood pressure reading results.  Do not choose a monitor that measures your blood pressure from your wrist or finger. Follow your health care provider's instructions for how to take your blood pressure. To use this form:  Get one reading in the morning (a.m.) before you take any medicines.  Get one reading in the evening (p.m.) before supper.  Take at least 2 readings with each blood pressure check. This makes sure the results are correct. Wait 1-2 minutes between measurements.  Write down the results in the spaces on this form.  Repeat this once a week, or as  told by your health care provider.  Make a follow-up appointment with your health care provider to discuss the results. Blood pressure log Date: _______________________  a.m. _____________________(1st reading) _____________________(2nd reading)  p.m. _____________________(1st reading) _____________________(2nd reading) Date: _______________________  a.m. _____________________(1st reading)  _____________________(2nd reading)  p.m. _____________________(1st reading) _____________________(2nd reading) Date: _______________________  a.m. _____________________(1st reading) _____________________(2nd reading)  p.m. _____________________(1st reading) _____________________(2nd reading) Date: _______________________  a.m. _____________________(1st reading) _____________________(2nd reading)  p.m. _____________________(1st reading) _____________________(2nd reading) Date: _______________________  a.m. _____________________(1st reading) _____________________(2nd reading)  p.m. _____________________(1st reading) _____________________(2nd reading) This information is not intended to replace advice given to you by your health care provider. Make sure you discuss any questions you have with your health care provider. Document Revised: 06/27/2017 Document Reviewed: 04/29/2017 Elsevier Patient Education  [place].    How to Take Your Blood Pressure You can take your blood pressure at home with a machine. You may need to check your blood pressure at home:  To check if you have high blood pressure (hypertension).  To check your blood pressure over time.  To make sure your blood pressure medicine is working. Supplies needed: You will need a blood pressure machine, or monitor. You can buy one at a drugstore or online. When choosing one:  Choose one with an arm cuff.  Choose one that wraps around your upper arm. Only one finger should fit between your arm and the cuff.  Do not choose one that measures your blood pressure from your wrist or finger. Your doctor can suggest a monitor. How to prepare Avoid these things for 30 minutes before checking your blood pressure:  Drinking caffeine.  Drinking alcohol.  Eating.  Smoking.  Exercising. Five minutes before checking your blood pressure:  Pee.  Sit in a dining chair. Avoid sitting in a soft couch or  armchair.  Be quiet. Do not talk. How to take your blood pressure Follow the instructions that came with your machine. If you have a digital blood pressure monitor, these may be the instructions: 1. Sit up straight. 2. Place your feet on the floor. Do not cross your ankles or legs. 3. Rest your left arm at the level of your heart. You may rest it on a table, desk, or chair. 4. Pull up your shirt sleeve. 5. Wrap the blood pressure cuff around the upper part of your left arm. The cuff should be 1 inch (2.5 cm) above your elbow. It is best to wrap the cuff around bare skin. 6. Fit the cuff snugly around your arm. You should be able to place only one finger between the cuff and your arm. 7. Put the cord inside the groove of your elbow. 8. Press the power button. 9. Sit quietly while the cuff fills with air and loses air. 10. Write down the numbers on the screen. 11. Wait 2-3 minutes and then repeat steps 1-10. What do the numbers mean? Two numbers make up your blood pressure. The first number is called systolic pressure. The second is called diastolic pressure. An example of a blood pressure reading is "120 over 80" (or 120/80). If you are an adult and do not have a medical condition, use this guide to find out if your blood pressure is normal: Normal  First number: below 120.  Second number: below 80. Elevated  First number: 120-129.  Second number: below 80. Hypertension stage 1  First number: 130-139.  Second number: 80-89. Hypertension stage 2  First number: 140 or above.  Second  number: 90 or above. Your blood pressure is above normal even if only the top or bottom number is above normal. Follow these instructions at home:  Check your blood pressure as often as your doctor tells you to.  Take your monitor to your next doctor's appointment. Your doctor will: ? Make sure you are using it correctly. ? Make sure it is working right.  Make sure you understand what your  blood pressure numbers should be.  Tell your doctor if your medicines are causing side effects. Contact a doctor if:  Your blood pressure keeps being high. Get help right away if:  Your first blood pressure number is higher than 180.  Your second blood pressure number is higher than 120. This information is not intended to replace advice given to you by your health care provider. Make sure you discuss any questions you have with your health care provider. Document Revised: 04/11/2017 Document Reviewed: 10/06/2015 Elsevier Patient Education  2020 [employer].

## 2019-08-05 ENCOUNTER — Other Ambulatory Visit: Payer: Self-pay | Admitting: Internal Medicine

## 2019-08-18 ENCOUNTER — Other Ambulatory Visit: Payer: Self-pay | Admitting: Internal Medicine

## 2019-08-22 ENCOUNTER — Other Ambulatory Visit: Payer: Self-pay | Admitting: Internal Medicine

## 2019-09-20 ENCOUNTER — Encounter: Payer: PPO | Admitting: Internal Medicine

## 2020-01-05 ENCOUNTER — Telehealth: Payer: Self-pay | Admitting: Pharmacist

## 2020-01-05 NOTE — Progress Notes (Addendum)
Chronic Care Management Pharmacy Assistant   Name: Kevin [name]  MRN: [id number] DOB: 1945-07-11  Reason for Encounter: Disease State  Patient Questions:  1.  Have you seen any other providers since your last visit? No  2.  Any changes in your medicines or health? No   PCP : Colon Branch, MD   Their chronic conditions include: COPD, Pre-DM, HTN, HLD, Hypothyroidism, Edema  There have been no OV, consults, or hospitalizations since the last Pharmacist visit.  Allergies:   Allergies  Allergen Reactions  . Bromide Ion [Bromine]   . Tiotropium Bromide Monohydrate Hives    Medications: Outpatient Encounter Medications as of 01/05/2020  Medication Sig  . albuterol (VENTOLIN HFA) 108 (90 Base) MCG/ACT inhaler Inhale 2 puffs into the lungs every 6 (six) hours as needed for wheezing or shortness of breath. (Patient not taking: Reported on 07/19/2019)  . aspirin 81 MG tablet Take 81 mg by mouth at bedtime.   . calcium carbonate (OS-CAL) 600 MG TABS tablet Take 600 mg by mouth at bedtime.   . Cyanocobalamin (VITAMIN B 12 PO) Take 1,000 mcg by mouth daily.  . fluticasone (FLOVENT HFA) 44 MCG/ACT inhaler Inhale 2 puffs into the lungs 2 (two) times daily as needed.  Marland Kitchen levothyroxine (SYNTHROID) 75 MCG tablet Take 1 tablet (75 mcg total) by mouth daily before breakfast.  . metoprolol succinate (TOPROL-XL) 50 MG 24 hr tablet Take 1 tablet (50 mg total) by mouth daily. Take with or immediately following a meal.  . Multiple Vitamins-Minerals (MULTIVITAMIN PO) Take 1 tablet by mouth at bedtime.   . simvastatin (ZOCOR) 40 MG tablet Take 1 tablet (40 mg total) by mouth at bedtime.  . vitamin E (VITAMIN E) 200 UNIT capsule Take 200 Units by mouth at bedtime.    No facility-administered encounter medications on file as of 01/05/2020.    Current Diagnosis: Patient Active Problem List   Diagnosis Date Noted  . Subclinical hypothyroidism 12/10/2016  . PCP NOTES >>>>>>>>>>>>>>>>> 12/07/2015  .  Lower urinary tract symptoms (LUTS) 10/11/2014  . Hyperglycemia 03/23/2014  . Edema 10/20/2013  . Skin lesion 10/20/2013  . Annual physical exam 01/24/2011  . Hyperlipidemia 03/23/2008  . Hypertension 04/15/2007  . COPD (chronic obstructive pulmonary disease) ([place]) 04/15/2007    Goals Addressed   None    Reviewed chart prior to disease state call. Spoke with patient regarding BP  Recent Office Vitals: BP Readings from Last 3 Encounters:  03/19/19 (!) 179/88  03/10/19 116/84  03/01/19 (!) 175/76   Pulse Readings from Last 3 Encounters:  03/19/19 65  03/10/19 63  03/01/19 66    Wt Readings from Last 3 Encounters:  03/19/19 180 lb 4 oz (81.8 kg)  03/10/19 179 lb (81.2 kg)  03/01/19 182 lb (82.6 kg)     Kidney Function Lab Results  Component Value Date/Time   CREATININE 1.05 12/18/2018 09:58 AM   CREATININE 0.97 12/12/2017 09:43 AM   GFR 68.83 12/18/2018 09:58 AM   GFRNONAA 90.88 11/15/2009 10:33 AM   GFRAA 97 04/15/2007 01:54 PM    BMP Latest Ref Rng & Units 12/18/2018 12/12/2017 12/10/2016  Glucose 70 - 99 mg/dL 93 100(H) 94  BUN 6 - 23 mg/dL 14 17 13   Creatinine 0.40 - 1.50 mg/dL 1.05 0.97 0.93  Sodium 135 - 145 mEq/L 140 139 138  Potassium 3.5 - 5.1 mEq/L 5.2 No hemolysis seen(H) 5.2(H) 4.6  Chloride 96 - 112 mEq/L 102 101 101  CO2 19 -  32 mEq/L 31 33(H) 31  Calcium 8.4 - 10.5 mg/dL 9.8 9.7 9.3    . Current antihypertensive regimen:   metoprolol succinate 50mg  daily . How often are you checking your Blood Pressure? when feeling symptomatic . Current home BP readings: 153/80, 136/78, 131/76 readings are form March. Patient stated he has stopped checking his blood pressure.  . What recent interventions/DTPs have been made by any provider to improve Blood Pressure control since last CPP Visit: nonr . Any recent hospitalizations or ED visits since last visit with CPP? No . What diet changes have been made to improve Blood Pressure Control?  o Patient states he  doesn't eat breakfast, but dinner usually home cooked meals.Sometimes he has fried food (pork chops) and baked food . What exercise is being done to improve your Blood Pressure Control?  o Patient states he goes to the gym three times a week. Monday, Wednesday, and Friday.  Adherence Review: Is the patient currently on ACE/ARB medication? No Does the patient have >5 day gap between last estimated fill dates? No    Follow-Up:  Scheduled Follow-Up With Clinical Pharmacist please schedule on September 28th @ 11:30am  Fanny Skates, [place] Pharmacist Assistant [phone]  Reviewed by: De Blanch, PharmD Clinical Pharmacist [facility] at [facility] [phone]

## 2020-01-07 ENCOUNTER — Ambulatory Visit: Payer: PPO | Admitting: *Deleted

## 2020-01-07 ENCOUNTER — Encounter: Payer: PPO | Admitting: Internal Medicine

## 2020-01-18 ENCOUNTER — Telehealth: Payer: PPO

## 2020-01-28 ENCOUNTER — Other Ambulatory Visit: Payer: Self-pay | Admitting: Internal Medicine

## 2020-01-29 ENCOUNTER — Other Ambulatory Visit: Payer: Self-pay | Admitting: Internal Medicine

## 2020-02-08 ENCOUNTER — Telehealth: Payer: PPO

## 2020-02-09 ENCOUNTER — Other Ambulatory Visit: Payer: Self-pay | Admitting: Internal Medicine

## 2020-02-14 ENCOUNTER — Other Ambulatory Visit: Payer: Self-pay | Admitting: Internal Medicine

## 2020-03-01 ENCOUNTER — Ambulatory Visit (INDEPENDENT_AMBULATORY_CARE_PROVIDER_SITE_OTHER): Payer: PPO | Admitting: Internal Medicine

## 2020-03-01 ENCOUNTER — Encounter: Payer: Self-pay | Admitting: Internal Medicine

## 2020-03-01 ENCOUNTER — Other Ambulatory Visit: Payer: Self-pay

## 2020-03-01 VITALS — BP 160/82 | HR 63 | Temp 98.1°F | Resp 18 | Ht 69.0 in | Wt 185.6 lb

## 2020-03-01 DIAGNOSIS — I10 Essential (primary) hypertension: Secondary | ICD-10-CM

## 2020-03-01 DIAGNOSIS — E7849 Other hyperlipidemia: Secondary | ICD-10-CM

## 2020-03-01 DIAGNOSIS — Z87891 Personal history of nicotine dependence: Secondary | ICD-10-CM

## 2020-03-01 DIAGNOSIS — Z0000 Encounter for general adult medical examination without abnormal findings: Secondary | ICD-10-CM | POA: Diagnosis not present

## 2020-03-01 DIAGNOSIS — Z122 Encounter for screening for malignant neoplasm of respiratory organs: Secondary | ICD-10-CM

## 2020-03-01 DIAGNOSIS — E038 Other specified hypothyroidism: Secondary | ICD-10-CM

## 2020-03-01 DIAGNOSIS — R739 Hyperglycemia, unspecified: Secondary | ICD-10-CM

## 2020-03-01 NOTE — Progress Notes (Signed)
Subjective:    Patient ID: Kevin [name], male    DOB: [birth year], 74 y.o.   MRN: [id number]  DOS:  03/01/2020 Type of visit - description: CPX Since the last office visit he is doing well and has no major concerns.   Review of Systems  A 14 point review of systems is negative     Past Medical History:  Diagnosis Date  . COPD (chronic obstructive pulmonary disease) (HCC)    PFTs 01-2006 mild obstruction, (+) reponse to Bronchodilators    . Hyperlipidemia   . Hypertension   . Shingles    Thorax, 2008  . Thyroid disease     Past Surgical History:  Procedure Laterality Date  . COLONOSCOPY  11/16/2008   Sharlett Iles  . MULTIPLE TOOTH EXTRACTIONS     dentures upper and partial lower  . NO PAST SURGERIES      Allergies as of 03/01/2020      Reactions   Bromide Ion [bromine]    Tiotropium Bromide Monohydrate Hives      Medication List       Accurate as of March 01, 2020 11:59 PM. If you have any questions, ask your nurse or doctor.        STOP taking these medications   aspirin 81 MG tablet Stopped by: Kathlene November, MD     TAKE these medications   albuterol 108 (90 Base) MCG/ACT inhaler Commonly known as: Ventolin HFA Inhale 2 puffs into the lungs every 6 (six) hours as needed for wheezing or shortness of breath.   calcium carbonate 600 MG Tabs tablet Commonly known as: OS-CAL Take 600 mg by mouth at bedtime.   fluticasone 44 MCG/ACT inhaler Commonly known as: FLOVENT HFA Inhale 2 puffs into the lungs 2 (two) times daily as needed.   levothyroxine 75 MCG tablet Commonly known as: SYNTHROID Take 1 tablet (75 mcg total) by mouth daily before breakfast.   metoprolol succinate 50 MG 24 hr tablet Commonly known as: TOPROL-XL Take 1 tablet (50 mg total) by mouth daily. Take with or immediately following a meal.   MULTIVITAMIN PO Take 1 tablet by mouth at bedtime.   simvastatin 40 MG tablet Commonly known as: ZOCOR Take 1 tablet (40 mg total) by mouth at  bedtime.   VITAMIN B 12 PO Take 1,000 mcg by mouth daily.   vitamin E 200 UNIT capsule Generic drug: vitamin E Take 200 Units by mouth at bedtime.          Objective:   Physical Exam BP (!) 160/82 (BP Location: Left Arm, Patient Position: Sitting, Cuff Size: Small)   Pulse 63   Temp 98.1 F (36.7 C)   Resp 18   Ht 5\' 9"  (1.753 m)   Wt 185 lb 9.6 oz (84.2 kg)   SpO2 98%   BMI 27.41 kg/m  General: Well developed, NAD, BMI noted Neck: No  thyromegaly  HEENT:  Normocephalic . Face symmetric, atraumatic Lungs:  CTA B Normal respiratory effort, no intercostal retractions, no accessory muscle use. Heart: RRR,  no murmur.  Abdomen:  Not distended, soft, non-tender. No rebound or rigidity.   Lower extremities: no pretibial edema bilaterally  Skin: Exposed areas without rash. Not pale. Not jaundice Neurologic:  alert & oriented X3.  Speech normal, gait appropriate for age and unassisted Strength symmetric and appropriate for age.  Psych: Cognition and judgment appear intact.  Cooperative with normal attention span and concentration.  Behavior appropriate. No anxious or depressed appearing.  Assessment    Assessment Hyperglycemia HTN Hypothyroidism Hyperlipidemia LUTS COPD: PFTs 2007 mild obstruction, + response to bronchodilators, h/o multiple inhalers intolerance  Shingles 2008, chest.    PLAN: Here for CPX Hyperglycemia: Check A1c HTN: On metoprolol, upon arrival BP was elevated, at home is consistently 120, 130.  Repeated BP: 160/82.  Recommend monitor BPs, see AVS and checking labs Hypothyroidism: Checking labs, on Synthroid. COPD: Basically asymptomatic, recommend albuterol as needed, if symptoms are slightly more persistent restart Flovent daily. RTC 6 months     This visit occurred during the SARS-CoV-2 public health emergency.  Safety protocols were in place, including screening questions prior to the visit, additional usage of staff PPE, and  extensive cleaning of exam room while observing appropriate contact time as indicated for disinfecting solutions.

## 2020-03-01 NOTE — Patient Instructions (Signed)
Check the  blood pressure weekly BP GOAL is between 110/65 and  135/85. If it is consistently higher or lower, let me know  Proceed with your Covid vaccination  We will schedule a CT of your chest  If you have cough or wheezing: -Use albuterol as needed for few days (rescue inhaler) -If you continue with symptoms, restart Flovent (fluticasone) twice a day and let me know  GO TO THE LAB : Get the blood work     GO TO [place], [place] back for a checkup in 6 months

## 2020-03-02 LAB — COMPREHENSIVE METABOLIC PANEL
AG Ratio: 1.7 (calc) (ref 1.0–2.5)
ALT: 17 U/L (ref 9–46)
AST: 18 U/L (ref 10–35)
Albumin: 4.4 g/dL (ref 3.6–5.1)
Alkaline phosphatase (APISO): 52 U/L (ref 35–144)
BUN: 13 mg/dL (ref 7–25)
CO2: 30 mmol/L (ref 20–32)
Calcium: 9.4 mg/dL (ref 8.6–10.3)
Chloride: 103 mmol/L (ref 98–110)
Creat: 1.01 mg/dL (ref 0.70–1.18)
Globulin: 2.6 g/dL (calc) (ref 1.9–3.7)
Glucose, Bld: 87 mg/dL (ref 65–99)
Potassium: 4.9 mmol/L (ref 3.5–5.3)
Sodium: 140 mmol/L (ref 135–146)
Total Bilirubin: 0.4 mg/dL (ref 0.2–1.2)
Total Protein: 7 g/dL (ref 6.1–8.1)

## 2020-03-02 LAB — HEMOGLOBIN A1C
Hgb A1c MFr Bld: 5.6 % of total Hgb (ref <5.7)
Mean Plasma Glucose: 114 (calc)
eAG (mmol/L): 6.3 (calc)

## 2020-03-02 LAB — LIPID PANEL
Cholesterol: 144 mg/dL (ref <200)
HDL: 49 mg/dL (ref >=40)
LDL Cholesterol (Calc): 69 mg/dL
Non-HDL Cholesterol (Calc): 95 mg/dL (ref <130)
Total CHOL/HDL Ratio: 2.9 (calc) (ref <5.0)
Triglycerides: 183 mg/dL — ABNORMAL HIGH (ref <150)

## 2020-03-02 LAB — CBC WITH DIFFERENTIAL/PLATELET
Absolute Monocytes: 428 cells/uL (ref 200–950)
Basophils Absolute: 19 cells/uL (ref 0–200)
Basophils Relative: 0.4 %
Eosinophils Absolute: 118 cells/uL (ref 15–500)
Eosinophils Relative: 2.5 %
HCT: 42.6 % (ref 38.5–50.0)
Hemoglobin: 14.5 g/dL (ref 13.2–17.1)
Lymphs Abs: 1993 cells/uL (ref 850–3900)
MCH: 32.4 pg (ref 27.0–33.0)
MCHC: 34 g/dL (ref 32.0–36.0)
MCV: 95.3 fL (ref 80.0–100.0)
MPV: 9.8 fL (ref 7.5–12.5)
Monocytes Relative: 9.1 %
Neutro Abs: 2143 cells/uL (ref 1500–7800)
Neutrophils Relative %: 45.6 %
Platelets: 231 10*3/uL (ref 140–400)
RBC: 4.47 10*6/uL (ref 4.20–5.80)
RDW: 12.3 % (ref 11.0–15.0)
Total Lymphocyte: 42.4 %
WBC: 4.7 10*3/uL (ref 3.8–10.8)

## 2020-03-02 LAB — TSH: TSH: 3.62 mIU/L (ref 0.40–4.50)

## 2020-03-03 ENCOUNTER — Encounter: Payer: Self-pay | Admitting: Internal Medicine

## 2020-03-03 NOTE — Assessment & Plan Note (Signed)
Here for CPX Hyperglycemia: Check A1c HTN: On metoprolol, upon arrival BP was elevated, at home is consistently 120, 130.  Repeated BP: 160/82.  Recommend monitor BPs, see AVS and checking labs Hypothyroidism: Checking labs, on Synthroid. COPD: Basically asymptomatic, recommend albuterol as needed, if symptoms are slightly more persistent restart Flovent daily. RTC 6 months

## 2020-03-03 NOTE — Assessment & Plan Note (Signed)
-  Td:2020 - pneumonia shot 12-08 , 2014;  prevnar 03-2014 -  zostavax :2015; s/p shingrex.   -Had 2 Covid shots, recommend a booster -had flu shot -Cscope x 2 (Dr Inocente Salles) , last coloscopy 11-2008 negative, colonoscopy 01-2019.  Next per GI.   -DRE and PSA 2020 normal. -Labs:CMP, FLP, CBC, A1c, TSH, -Lung cancer screening: Ordered CT -Lifestyle discussed

## 2020-03-15 ENCOUNTER — Ambulatory Visit (HOSPITAL_BASED_OUTPATIENT_CLINIC_OR_DEPARTMENT_OTHER): Payer: PPO

## 2020-04-13 DIAGNOSIS — Z20822 Contact with and (suspected) exposure to covid-19: Secondary | ICD-10-CM | POA: Diagnosis not present

## 2020-04-13 DIAGNOSIS — R509 Fever, unspecified: Secondary | ICD-10-CM | POA: Diagnosis not present

## 2020-04-13 DIAGNOSIS — R0981 Nasal congestion: Secondary | ICD-10-CM | POA: Diagnosis not present

## 2020-04-13 DIAGNOSIS — R051 Acute cough: Secondary | ICD-10-CM | POA: Diagnosis not present

## 2020-06-05 ENCOUNTER — Telehealth: Payer: Self-pay | Admitting: Pharmacist

## 2020-06-05 NOTE — Progress Notes (Addendum)
Chronic Care Management Pharmacy Assistant   Name: Kevin [name]  MRN: [id number] DOB: Sep 06, 1945  Reason for Encounter: Disease State for HTN.   Patient Questions:  1.  Have you seen any other providers since your last visit? Yes.   2.  Any changes in your medicines or health? Yes.    PCP : Colon Branch, MD   Their chronic conditions include: COPD, Pre-DM, HTN, HLD, Hypothyroidism, Edema  Office Visits: 03/01/20 Dr. Larose Kells. Routine check up. No medication changes.   Consults:  Allergies:   Allergies  Allergen Reactions   Bromide Ion [Bromine]    Tiotropium Bromide Monohydrate Hives    Medications: Outpatient Encounter Medications as of 06/05/2020  Medication Sig   albuterol (VENTOLIN HFA) 108 (90 Base) MCG/ACT inhaler Inhale 2 puffs into the lungs every 6 (six) hours as needed for wheezing or shortness of breath.   calcium carbonate (OS-CAL) 600 MG TABS tablet Take 600 mg by mouth at bedtime.    Cyanocobalamin (VITAMIN B 12 PO) Take 1,000 mcg by mouth daily.   fluticasone (FLOVENT HFA) 44 MCG/ACT inhaler Inhale 2 puffs into the lungs 2 (two) times daily as needed.   levothyroxine (SYNTHROID) 75 MCG tablet Take 1 tablet (75 mcg total) by mouth daily before breakfast.   metoprolol succinate (TOPROL-XL) 50 MG 24 hr tablet Take 1 tablet (50 mg total) by mouth daily. Take with or immediately following a meal.   Multiple Vitamins-Minerals (MULTIVITAMIN PO) Take 1 tablet by mouth at bedtime.    simvastatin (ZOCOR) 40 MG tablet Take 1 tablet (40 mg total) by mouth at bedtime.   vitamin E (VITAMIN E) 200 UNIT capsule Take 200 Units by mouth at bedtime.    No facility-administered encounter medications on file as of 06/05/2020.    Current Diagnosis: Patient Active Problem List   Diagnosis Date Noted   Subclinical hypothyroidism 12/10/2016   PCP NOTES >>>>>>>>>>>>>>>>> 12/07/2015   Lower urinary tract symptoms (LUTS) 10/11/2014   Hyperglycemia 03/23/2014   Edema 10/20/2013    Skin lesion 10/20/2013   Annual physical exam 01/24/2011   Hyperlipidemia 03/23/2008   Hypertension 04/15/2007   COPD (chronic obstructive pulmonary disease) ([place]) 04/15/2007    Goals Addressed   None    Reviewed chart prior to disease state call. Spoke with patient regarding BP  Recent Office Vitals: BP Readings from Last 3 Encounters:  03/01/20 (!) 160/82  03/19/19 (!) 179/88  03/10/19 116/84   Pulse Readings from Last 3 Encounters:  03/01/20 63  03/19/19 65  03/10/19 63    Wt Readings from Last 3 Encounters:  03/01/20 185 lb 9.6 oz (84.2 kg)  03/19/19 180 lb 4 oz (81.8 kg)  03/10/19 179 lb (81.2 kg)     Kidney Function Lab Results  Component Value Date/Time   CREATININE 1.01 03/01/2020 03:02 PM   CREATININE 1.05 12/18/2018 09:58 AM   CREATININE 0.97 12/12/2017 09:43 AM   GFR 68.83 12/18/2018 09:58 AM   GFRNONAA 90.88 11/15/2009 10:33 AM   GFRAA 97 04/15/2007 01:54 PM    BMP Latest Ref Rng & Units 03/01/2020 12/18/2018 12/12/2017  Glucose 65 - 99 mg/dL 87 93 100(H)  BUN 7 - 25 mg/dL 13 14 17   Creatinine 0.70 - 1.18 mg/dL 1.01 1.05 0.97  BUN/Creat Ratio 6 - 22 (calc) NOT APPLICABLE - -  Sodium 629 - 146 mmol/L 140 140 139  Potassium 3.5 - 5.3 mmol/L 4.9 5.2 No hemolysis seen(H) 5.2(H)  Chloride 98 - 110 mmol/L 103  102 101  CO2 20 - 32 mmol/L 30 31 33(H)  Calcium 8.6 - 10.3 mg/dL 9.4 9.8 9.7    Current antihypertensive regimen:  metoprolol succinate 50 mg daily  How often are you checking your Blood Pressure? when feeling symptomatic   Current home BP readings: N/A  What recent interventions/DTPs have been made by any provider to improve Blood Pressure control since last CPP Visit: None.  Any recent hospitalizations or ED visits since last visit with CPP? No.  What diet changes have been made to improve Blood Pressure Control?  Patient stated he doesn't eat with salt. He states he doesn't eat processed foods.  What exercise is being done to improve your  Blood Pressure Control?  Patient stated goes to the gym for a few hours for Monday, Wed, Friday when the weather allows him to do so. Patient stated he also does house work like cleaning.   Adherence Review: Is the patient currently on ACE/ARB medication? No.  Does the patient have >5 day gap between last estimated fill dates? No  Patient stated he gets his medication from [facility]. He stated he doesn't have any questions his medication at this time.    Follow-Up:  Pharmacist Review   Charlann Lange, RMA Clinical Pharmacist Assistant [phone]  3 minutes spent in review, coordination, and documentation.  Reviewed by: Beverly Milch, PharmD Clinical Pharmacist [facility] [phone]

## 2020-06-29 ENCOUNTER — Other Ambulatory Visit: Payer: Self-pay | Admitting: Internal Medicine

## 2020-08-02 ENCOUNTER — Other Ambulatory Visit: Payer: Self-pay | Admitting: Internal Medicine

## 2020-08-09 ENCOUNTER — Other Ambulatory Visit: Payer: Self-pay | Admitting: Internal Medicine

## 2020-08-31 ENCOUNTER — Encounter: Payer: Self-pay | Admitting: Internal Medicine

## 2020-08-31 ENCOUNTER — Ambulatory Visit (INDEPENDENT_AMBULATORY_CARE_PROVIDER_SITE_OTHER): Payer: PPO | Admitting: Internal Medicine

## 2020-08-31 ENCOUNTER — Other Ambulatory Visit: Payer: Self-pay

## 2020-08-31 VITALS — BP 146/84 | HR 68 | Temp 97.9°F | Resp 18 | Ht 69.0 in | Wt 181.5 lb

## 2020-08-31 DIAGNOSIS — I10 Essential (primary) hypertension: Secondary | ICD-10-CM

## 2020-08-31 DIAGNOSIS — E038 Other specified hypothyroidism: Secondary | ICD-10-CM

## 2020-08-31 LAB — TSH: TSH: 4.76 u[IU]/mL — ABNORMAL HIGH (ref 0.35–4.50)

## 2020-08-31 MED ORDER — AMLODIPINE BESYLATE 5 MG PO TABS: 5.0000 mg | ORAL_TABLET | Freq: Every day | ORAL | 1 refills | Status: DC

## 2020-08-31 MED ORDER — ALBUTEROL SULFATE HFA 108 (90 BASE) MCG/ACT IN AERS: 2.0000 | INHALATION_SPRAY | Freq: Four times a day (QID) | RESPIRATORY_TRACT | 5 refills | Status: DC | PRN

## 2020-08-31 MED ORDER — FLUTICASONE PROPIONATE HFA 44 MCG/ACT IN AERO: 2.0000 | INHALATION_SPRAY | Freq: Two times a day (BID) | RESPIRATORY_TRACT | 5 refills | Status: DC | PRN

## 2020-08-31 NOTE — Progress Notes (Signed)
Subjective:    Patient ID: Kevin [name], male    DOB: Aug 09, 1945, 75 y.o.   MRN: [id number]  DOS:  08/31/2020 Type of visit - description: Follow-up Since the last office visit he is doing well and has no concerns. Ambulatory BPs normal  BP Readings from Last 3 Encounters:  08/31/20 (!) 146/84  03/01/20 (!) 160/82  03/19/19 (!) 179/88    Review of Systems Denies chest pain.  No DOE with ADLs Occasional cough.  Mild.  Not using inhalers.  Past Medical History:  Diagnosis Date  . COPD (chronic obstructive pulmonary disease) (HCC)    PFTs 01-2006 mild obstruction, (+) reponse to Bronchodilators    . Hyperlipidemia   . Hypertension   . Shingles    Thorax, 2008  . Thyroid disease     Past Surgical History:  Procedure Laterality Date  . COLONOSCOPY  11/16/2008   Sharlett Iles  . MULTIPLE TOOTH EXTRACTIONS     dentures upper and partial lower  . NO PAST SURGERIES      Allergies as of 08/31/2020      Reactions   Bromide Ion [bromine]    Tiotropium Bromide Monohydrate Hives      Medication List       Accurate as of August 31, 2020 11:59 PM. If you have any questions, ask your nurse or doctor.        albuterol 108 (90 Base) MCG/ACT inhaler Commonly known as: Ventolin HFA Inhale 2 puffs into the lungs every 6 (six) hours as needed for wheezing or shortness of breath.   amLODipine 5 MG tablet Commonly known as: NORVASC Take 1 tablet (5 mg total) by mouth daily. Started by: Kathlene November, MD   calcium carbonate 600 MG Tabs tablet Commonly known as: OS-CAL Take 600 mg by mouth at bedtime.   fluticasone 44 MCG/ACT inhaler Commonly known as: FLOVENT HFA Inhale 2 puffs into the lungs 2 (two) times daily as needed.   levothyroxine 75 MCG tablet Commonly known as: SYNTHROID Take 1 tablet (75 mcg total) by mouth daily before breakfast.   metoprolol succinate 50 MG 24 hr tablet Commonly known as: TOPROL-XL Take 1 tablet (50 mg total) by mouth daily. Take with or  immediately following a meal.   MULTIVITAMIN PO Take 1 tablet by mouth at bedtime.   simvastatin 40 MG tablet Commonly known as: ZOCOR Take 1 tablet (40 mg total) by mouth at bedtime.   VITAMIN B 12 PO Take 1,000 mcg by mouth daily.   vitamin E 200 UNIT capsule Take 200 Units by mouth at bedtime.          Objective:   Physical Exam BP (!) 146/84 (BP Location: Right Arm, Patient Position: Sitting, Cuff Size: Small)   Pulse 68   Temp 97.9 F (36.6 C) (Oral)   Resp 18   Ht 5\' 9"  (1.753 m)   Wt 181 lb 8 oz (82.3 kg)   SpO2 96%   BMI 26.80 kg/m  General:   Well developed, NAD, BMI noted. HEENT:  Normocephalic . Face symmetric, atraumatic Lungs:  CTA B Normal respiratory effort, no intercostal retractions, no accessory muscle use. Heart: RRR,  no murmur.  Lower extremities: no pretibial edema bilaterally  Skin: Not pale. Not jaundice Neurologic:  alert & oriented X3.  Speech normal, gait appropriate for age and unassisted Psych--  Cognition and judgment appear intact.  Cooperative with normal attention span and concentration.  Behavior appropriate. No anxious or depressed appearing.  Assessment     Assessment Hyperglycemia HTN Hypothyroidism Hyperlipidemia LUTS COPD: PFTs 2007 mild obstruction, + response to bronchodilators, h/o multiple inhalers intolerance  Shingles 2008, chest.    PLAN: Hyperglycemia: A1c consistently below 6. HTN: Good compliance with metoprolol, ambulatory BPs consistently in the 120s, 130s, BP elevated today,  recheck by my nurse: 146/84. I rechecked manually in both arms: 190/110. Unclear why we are getting such different BPs, he is asymptomatic. Add amlodipine 5 mg daily, nurse visit in 6 weeks Hypothyroidism: Check TSH COPD: Minimal symptoms, not using inhalers, recommend to restart them as needed, see AVS.  RF printed. Preventive care: COVID-vaccine x3, rec shot #4 Lung cancer screening: Patient is quite reluctant to  proceed, he knows the benefits.  No further screening. RTC for a nurse visit in 6 weeks RTC 6 months CPX      This visit occurred during the SARS-CoV-2 public health emergency.  Safety protocols were in place, including screening questions prior to the visit, additional usage of staff PPE, and extensive cleaning of exam room while observing appropriate contact time as indicated for disinfecting solutions.

## 2020-08-31 NOTE — Patient Instructions (Addendum)
Add a medication called amlodipine 5 mg, 1 tablet every day, this will help to control your blood pressure  Continue checking your blood pressures regularly  BP GOAL is between 110/65 and  135/85. If it is consistently higher or lower, let me know  If needed, restart fluticasone (Flovent)  twice a day. Keep the albuterol on hand, this is your rescue inhaler, take it if you continue having some cough with Flovent.  If you have severe symptoms let me know  Strongly consider take a 4th COVID shot     GO TO THE FRONT DESK, [place] back for   a physical exam in 6 months  Schedule a nurse visit in 6 weeks from today

## 2020-09-01 NOTE — Assessment & Plan Note (Signed)
Hyperglycemia: A1c consistently below 6. HTN: Good compliance with metoprolol, ambulatory BPs consistently in the 120s, 130s, BP elevated today,  recheck by my nurse: 146/84. I rechecked manually in both arms: 190/110. Unclear why we are getting such different BPs, he is asymptomatic. Add amlodipine 5 mg daily, nurse visit in 6 weeks Hypothyroidism: Check TSH COPD: Minimal symptoms, not using inhalers, recommend to restart them as needed, see AVS.  RF printed. Preventive care: COVID-vaccine x3, rec shot #4 Lung cancer screening: Patient is quite reluctant to proceed, he knows the benefits.  No further screening. RTC for a nurse visit in 6 weeks RTC 6 months CPX

## 2020-09-04 MED ORDER — LEVOTHYROXINE SODIUM 100 MCG PO TABS: 100.0000 ug | ORAL_TABLET | Freq: Every day | ORAL | 0 refills | Status: DC

## 2020-09-04 NOTE — Addendum Note (Signed)
Addended byDamita Dunnings D on: 09/04/2020 11:00 AM   Modules accepted: Orders

## 2020-09-04 NOTE — Addendum Note (Signed)
Addended byDamita Dunnings D on: 09/04/2020 11:02 AM   Modules accepted: Orders

## 2020-09-12 ENCOUNTER — Ambulatory Visit: Payer: PPO | Attending: Internal Medicine

## 2020-09-12 DIAGNOSIS — Z23 Encounter for immunization: Secondary | ICD-10-CM

## 2020-09-12 NOTE — Progress Notes (Signed)
   Covid-19 Vaccination Clinic  Name:  Kevin [name]    MRN: [id number] DOB: 18-Jul-1945  09/12/2020  [name] was observed post Covid-19 immunization for 15 minutes without incident. He was provided with Vaccine Information Sheet and instruction to access the V-Safe system.   [name] was instructed to call 911 with any severe reactions post vaccine: Marland Kitchen Difficulty breathing  . Swelling of face and throat  . A fast heartbeat  . A bad rash all over body  . Dizziness and weakness   Immunizations Administered    Name Date Dose VIS Date Route   PFIZER Comrnaty(Gray TOP) Covid-19 Vaccine 09/12/2020 10:38 AM 0.3 mL 04/20/2020 Intramuscular   Manufacturer: [employer]   Lot: [id number]   NDC: [phone]

## 2020-09-19 ENCOUNTER — Other Ambulatory Visit (HOSPITAL_BASED_OUTPATIENT_CLINIC_OR_DEPARTMENT_OTHER): Payer: Self-pay

## 2020-09-19 MED ORDER — PFIZER-BIONT COVID-19 VAC-TRIS 30 MCG/0.3ML IM SUSP
INTRAMUSCULAR | 0 refills | Status: DC
  Filled 2020-09-19: qty 0.3, 1d supply, fill #0

## 2020-09-27 ENCOUNTER — Telehealth: Payer: Self-pay | Admitting: Pharmacist

## 2020-09-27 ENCOUNTER — Telehealth: Payer: Self-pay | Admitting: Internal Medicine

## 2020-09-27 NOTE — Chronic Care Management (AMB) (Signed)
Chronic Care Management Pharmacy Assistant   Name: Kevin [name]  MRN: [id number] DOB: 08-10-1945   Reason for Encounter: Disease State Hypertension    Recent office visits:  08/31/2020 Kathlene November, MD (PCP) General follow up. Start on Amlodipine 5 mg daily. Recommend to restart on inhalers. Follow up in 6 months.   Recent consult visits:  None noted  Hospital visits:  None in previous 6 months  Medications: Outpatient Encounter Medications as of 09/27/2020  Medication Sig  . albuterol (VENTOLIN HFA) 108 (90 Base) MCG/ACT inhaler Inhale 2 puffs into the lungs every 6 (six) hours as needed for wheezing or shortness of breath.  Marland Kitchen amLODipine (NORVASC) 5 MG tablet Take 1 tablet (5 mg total) by mouth daily.  . calcium carbonate (OS-CAL) 600 MG TABS tablet Take 600 mg by mouth at bedtime.   Marland Kitchen COVID-19 mRNA Vac-TriS, Pfizer, (PFIZER-BIONT COVID-19 VAC-TRIS) SUSP injection Inject into the muscle.  . Cyanocobalamin (VITAMIN B 12 PO) Take 1,000 mcg by mouth daily.  . fluticasone (FLOVENT HFA) 44 MCG/ACT inhaler Inhale 2 puffs into the lungs 2 (two) times daily as needed.  Marland Kitchen levothyroxine (SYNTHROID) 100 MCG tablet Take 1 tablet (100 mcg total) by mouth daily before breakfast.  . metoprolol succinate (TOPROL-XL) 50 MG 24 hr tablet Take 1 tablet (50 mg total) by mouth daily. Take with or immediately following a meal.  . Multiple Vitamins-Minerals (MULTIVITAMIN PO) Take 1 tablet by mouth at bedtime.   . simvastatin (ZOCOR) 40 MG tablet Take 1 tablet (40 mg total) by mouth at bedtime.  . vitamin E 200 UNIT capsule Take 200 Units by mouth at bedtime.    No facility-administered encounter medications on file as of 09/27/2020.   Reviewed chart prior to disease state call. Spoke with patient regarding BP  Recent Office Vitals: BP Readings from Last 3 Encounters:  08/31/20 (!) 146/84  03/01/20 (!) 160/82  03/19/19 (!) 179/88   Pulse Readings from Last 3 Encounters:  08/31/20 68  03/01/20  63  03/19/19 65    Wt Readings from Last 3 Encounters:  08/31/20 181 lb 8 oz (82.3 kg)  03/01/20 185 lb 9.6 oz (84.2 kg)  03/19/19 180 lb 4 oz (81.8 kg)     Kidney Function Lab Results  Component Value Date/Time   CREATININE 1.01 03/01/2020 03:02 PM   CREATININE 1.05 12/18/2018 09:58 AM   CREATININE 0.97 12/12/2017 09:43 AM   GFR 68.83 12/18/2018 09:58 AM   GFRNONAA 90.88 11/15/2009 10:33 AM   GFRAA 97 04/15/2007 01:54 PM    BMP Latest Ref Rng & Units 03/01/2020 12/18/2018 12/12/2017  Glucose 65 - 99 mg/dL 87 93 100(H)  BUN 7 - 25 mg/dL 13 14 17   Creatinine 0.70 - 1.18 mg/dL 1.01 1.05 0.97  BUN/Creat Ratio 6 - 22 (calc) NOT APPLICABLE - -  Sodium 086 - 146 mmol/L 140 140 139  Potassium 3.5 - 5.3 mmol/L 4.9 5.2 No hemolysis seen(H) 5.2(H)  Chloride 98 - 110 mmol/L 103 102 101  CO2 20 - 32 mmol/L 30 31 33(H)  Calcium 8.6 - 10.3 mg/dL 9.4 9.8 9.7    . Current antihypertensive regimen:  o Amlodipine 5 mg take 1 tab daily o Metoprolol Succinate 50 mg take 1 tab daily  . How often are you checking your Blood Pressure? 1-2x per week   . Current home BP readings:130/82  . What recent interventions/DTPs have been made by any provider to improve Blood Pressure control since last CPP Visit: Added  Amlodipine 5 mg on 08/31/2020.  Marland Kitchen Any recent hospitalizations or ED visits since last visit with CPP? No   . What diet changes have been made to improve Blood Pressure Control?  o Patient states he limits salt intake.  . What exercise is being done to improve your Blood Pressure Control?  o Patient states he goes to [facility] once a week.   Adherence Review: Is the patient currently on ACE/ARB medication? No Does the patient have >5 day gap between last estimated fill dates? No  Patient states he has not been taking the amlodipine. Patient states it caused his leg to swell.  Star Rating Drugs: Simvastatin 40 mg Last filled:03/32/2022 90 DS  [place] Clinical  Pharmacist Assistant [phone]

## 2020-10-12 ENCOUNTER — Ambulatory Visit: Payer: PPO

## 2020-10-12 ENCOUNTER — Other Ambulatory Visit: Payer: Self-pay

## 2020-10-12 VITALS — BP 141/74 | HR 67

## 2020-10-12 DIAGNOSIS — I10 Essential (primary) hypertension: Secondary | ICD-10-CM

## 2020-10-12 NOTE — Progress Notes (Signed)
Patient here today for 6 weeks BP check. Amlodipine 5 mg added on 08-31-20.  BP Readings from Last 3 Encounters:  08/31/20 (!) 146/84  03/01/20 (!) 160/82  03/19/19 (!) 179/88   BP today is 141/74 With a pulse of 67.  Per Dr Larose Kells, patient advised to continue same medications and to continue to check daily at home. He will follow up in 6 months from last visit as scheduled.  Rod Holler CMA,

## 2020-10-17 ENCOUNTER — Ambulatory Visit (INDEPENDENT_AMBULATORY_CARE_PROVIDER_SITE_OTHER): Payer: PPO | Admitting: Pharmacist

## 2020-10-17 DIAGNOSIS — J410 Simple chronic bronchitis: Secondary | ICD-10-CM

## 2020-10-17 DIAGNOSIS — I10 Essential (primary) hypertension: Secondary | ICD-10-CM

## 2020-10-17 DIAGNOSIS — E7849 Other hyperlipidemia: Secondary | ICD-10-CM | POA: Diagnosis not present

## 2020-10-17 NOTE — Patient Instructions (Signed)
Visit Information  PATIENT GOALS: Goals Addressed            This Visit's Progress   . Pharmacy Care Plan   Not on track    CARE PLAN ENTRY  Current Barriers:  . Chronic Disease Management support, education, and care coordination needs related to COPD, Pre-DM, HTN, HLD, Hypothyroidism, Edema . Unable to maintain control of hypertension  Pharmacist Clinical Goal(s):  Marland Kitchen A1c goal <6.5% . LDL goal <100 . Blood pressure goal <140/90  Pharmacist Clinical Goal(s):  Marland Kitchen Over the next 180 days, patient will achieve adherence to monitoring guidelines and medication adherence to achieve therapeutic efficacy . maintain control of HTN as evidenced by BP <140/90  . adhere to prescribed medication regimen as evidenced by fill history . contact provider office for questions/concerns as evidenced notation of same in electronic health record through collaboration with PharmD and provider.   Interventions: . 1:1 collaboration with Colon Branch, MD regarding development and update of comprehensive plan of care as evidenced by provider attestation and co-signature . Inter-disciplinary care team collaboration (see longitudinal plan of care) . Comprehensive medication review performed; medication list updated in electronic medical record  Hypertension: BP Readings from Last 3 Encounters:  10/12/20 (!) 141/74  08/31/20 (!) 146/84  03/01/20 (!) 160/82   . Blood pressure goal <140/90 . Current treatment: o Metoprolol succinate ER 50mg  daily  . Interventions:  o Continue to check blood pressure 2 to 3 times per week o Discussed blood pressure goal o Continue to limit intake of sodium to <2300 mg per day  Hyperlipidemia: Lipid Panel     Component Value Date/Time   CHOL 144 03/01/2020 1502   TRIG 183 (H) 03/01/2020 1502   HDL 49 03/01/2020 1502   CHOLHDL 2.9 03/01/2020 1502   LDLCALC 69 03/01/2020 1502   . LDL goal <100 . Current treatment: o Simvastatin 40mg  daily  . Interventions:   o Discussed last lipid panel and LDL goal o Continue current regimen for lipids  Chronic Obstructive Pulmonary Disease:  . Current treatment: o Prescribed Flovent HFA 56mcg and albuterol HFA but patient reports he has not used either in over a year  . Interventions:  o Recommend consider follow up with pulmonologist to evaluate lung function  o Keep albuterol inhaler on hand for rescue / emergency  Medication Management:  . [facility] . Reviewed medications and updated medication list to add vitamins and herbal medications patient has recently started - turmeric, Krill oil and zinc.  . Discussed adherence  Patient Goals/Self-Care Activities . Over the next 180 days, patient will:  o take medications as prescribed,  o check blood pressure 2 to 3 times per week, document, and provide at future appointments, and  o target a minimum of 150 minutes of moderate intensity exercise weekly   Please see past updates related to this goal by clicking on the "Past Updates" button in the selected goal         The patient verbalized understanding of instructions, educational materials, and care plan provided today and declined offer to receive copy of patient instructions, educational materials, and care plan.   The care management team will reach out to the patient again over the next 90 days.  Clinical pharmacist will follow up in 6 months unless needed sooner.   Cherre Robins, PharmD Clinical Pharmacist [facility] [phone]

## 2020-10-17 NOTE — Chronic Care Management (AMB) (Signed)
Chronic Care Management Pharmacy Note  10/17/2020 Name:  Kevin [name] MRN:  [id number] DOB:  11-Apr-1946  Summary: Patient stopped amlodipine due to edema. Home BP has ranged from SBP 127-140 and DBP 79 to 85.  Patient has not used Flovent or albuterol inhalers in over a year. Patient reports COPD is well controlled without need for pharmacotherapy.   Recommendations/Changes made from today's visit: Continue to check BP at home 2 to 3 times per week. Continue to take metoprolol succinate 78m daily COPD seems to be well controlled without use of pharmacotherapy. Consider restarting in future if patient begins to have symptoms or experiences exacerbations.   Plan: In future if additional BP lowering needed, consider diuretic like hydrochlorothiazide or spironolactone since patient also has edema.   Subjective: Kevin YURCHAKis an 89y.Kevin. year old male who is a primary patient of Paz, JAlda Berthold MD.  The CCM team was consulted for assistance with disease management and care coordination needs.    Engaged with patient by telephone for follow up visit in response to provider referral for pharmacy case management and/or care coordination services.   Consent to Services:  The patient was given the following information about Chronic Care Management services today, agreed to services, and gave verbal consent: 1. CCM service includes personalized support from designated clinical staff supervised by the primary care provider, including individualized plan of care and coordination with other care providers 2. 24/7 contact phone numbers for assistance for urgent and routine care needs. 3. Service will only be billed when office clinical staff spend 20 minutes or more in a month to coordinate care. 4. Only one practitioner may furnish and bill the service in a calendar month. 5.The patient may stop CCM services at any time (effective at the end of the month) by phone call to the office staff. 6. The  patient will be responsible for cost sharing (co-pay) of up to 20% of the service fee (after annual deductible is met). Patient agreed to services and consent obtained.  Patient Care Team: PColon Branch MD as PCP - [name] Kevin [name], HKirke Corin MD as Consulting Physician (Gastroenterology) HRenda Rolls CJennefer Bravo MD as Referring Physician (Dermatology) ECherre Robins PharmD (Pharmacist)  Recent office visits: 10/12/2020 - PCP (nurse visit) BP was 141/74. No notation of not taking amlodipine but phone call in May noted patient has stopped amlodipine due to edema.   08/31/2020 - PCP (Dr PLarose [name] [name] f/u. Started Amlodipine 5 mg daily. Recommend to restart on inhalers. F/U 6 months  Recent consult visits: None in last 6 months Last visit with pulmonologist - Dr OAnder Slade10/2020  Hospital visits: None in previous 6 months  Objective:  Lab Results  Component Value Date   CREATININE 1.01 03/01/2020   CREATININE 1.05 12/18/2018   CREATININE 0.97 12/12/2017    Lab Results  Component Value Date   HGBA1C 5.6 03/01/2020   Last diabetic Eye exam: No results found for: HMDIABEYEEXA  Last diabetic Foot exam: No results found for: HMDIABFOOTEX      Component Value Date/Time   CHOL 144 03/01/2020 1502   TRIG 183 (Kevin) 03/01/2020 1502   HDL 49 03/01/2020 1502   CHOLHDL 2.9 03/01/2020 1502   VLDL 23.0 12/18/2018 0958   LDLCALC 69 03/01/2020 1502   LDLDIRECT 162.2 08/01/2008 1513    Hepatic Function Latest Ref Rng & Units 03/01/2020 12/18/2018 12/12/2017  Total Protein 6.1 - 8.1 g/dL 7.0 7.1 7.3  Albumin 3.5 - 5.2  g/dL - 4.4 4.5  AST 10 - 35 U/L _0 ALT 9 - 46 U/L _1 Alk Phosphatase 39 - 117 U/L - 52 53  Total Bilirubin 0.2 - 1.2 mg/dL 0.4 0.5 0.6  Bilirubin, Direct 0.0 - 0.3 mg/dL - - -    Lab Results  Component Value Date/Time   TSH 4.76 (Kevin) 08/31/2020 10:04 AM   TSH 3.62 03/01/2020 03:02 PM   FREET4 0.79 12/10/2016 09:47 AM   FREET4 0.61 03/11/2016 08:59 AM    CBC  Latest Ref Rng & Units 03/01/2020 12/18/2018 12/12/2017  WBC 3.8 - 10.8 Thousand/uL 4.7 4.3 4.4  Hemoglobin 13.2 - 17.1 g/dL 14.5 14.8 15.0  Hematocrit 38.5 - 50.0 % 42.6 44.1 43.8  Platelets 140 - 400 Thousand/uL 231 269.0 283.0    No results found for: VD25OH  Clinical ASCVD: No  The 10-year ASCVD risk score Kevin [name]., et al., 2013) is: 32%   Values used to calculate the score:     Age: 53 years     Sex: Male     Is Non-Hispanic African American: No     Diabetic: No     Tobacco smoker: No     Systolic Blood Pressure: 579 mmHg     Is BP treated: Yes     HDL Cholesterol: 49 mg/dL     Total Cholesterol: 144 mg/dL    Other: (CHADS2VASc if Afib, PHQ9 if depression, MMRC or CAT for COPD, ACT, DEXA)  Social History   Tobacco Use  Smoking Status Former Smoker  . Types: Cigarettes  . Start date: 77  . Quit date: 05/13/2004  . Years since quitting: 16.4  Smokeless Tobacco Never Used  Tobacco Comment   used to smoke 2 ppd    BP Readings from Last 3 Encounters:  10/12/20 (!) 141/74  08/31/20 (!) 146/84  03/01/20 (!) 160/82   Pulse Readings from Last 3 Encounters:  10/12/20 67  08/31/20 68  03/01/20 63   Wt Readings from Last 3 Encounters:  08/31/20 181 lb 8 oz (82.3 kg)  03/01/20 185 lb 9.6 oz (84.2 kg)  03/19/19 180 lb 4 oz (81.8 kg)    Assessment: Review of patient past medical history, allergies, medications, health status, including review of consultants reports, laboratory and other test data, was performed as part of comprehensive evaluation and provision of chronic care management services.   SDOH:  (Social Determinants of Health) assessments and interventions performed:  SDOH Interventions   Flowsheet Row Most Recent Value  SDOH Interventions   Financial Strain Interventions Intervention Not Indicated  Physical Activity Interventions [employer], Other (Comments)  [recommended increase frequency of exercise]      CCM Care Plan  Allergies  Allergen  Reactions  . Bromide Ion [Bromine]   . Tiotropium Bromide Monohydrate Hives    Medications Reviewed Today    Reviewed by Cherre Robins, PharmD (Pharmacist) on 10/17/20 at 27  Med List Status: <None>  Medication Order Taking? Sig Documenting Provider Last Dose Status Informant  albuterol (VENTOLIN HFA) 108 (90 Base) MCG/ACT inhaler [id number] No Inhale 2 puffs into the lungs every 6 (six) hours as needed for wheezing or shortness of breath.  Patient not taking: Reported on 10/17/2020   Colon Branch, MD Not Taking Active   amLODipine (NORVASC) 5 MG tablet [id number] No Take 1 tablet (5 mg total) by mouth daily.  Patient not taking: Reported on 10/17/2020   Colon Branch, MD Not Taking Consider  Medication Status and Discontinue   aspirin EC 81 MG tablet [id number] Yes Take 81 mg by mouth daily. Swallow whole. [provider] Taking Active   calcium carbonate (OS-CAL) 600 MG TABS tablet [id number] Yes Take 600 mg by mouth at bedtime.  [provider] Taking Active   Cyanocobalamin (VITAMIN B 12 PO) [id number] Yes Take 1,000 mcg by mouth daily. [provider] Taking Active   fluticasone (FLOVENT HFA) 44 MCG/ACT inhaler [id number] No Inhale 2 puffs into the lungs 2 (two) times daily as needed.  Patient not taking: Reported on 10/17/2020   Colon Branch, MD Not Taking Consider Medication Status and Discontinue   Krill Oil 350 MG CAPS [id number] Yes Take 1 capsule by mouth daily. [provider] Taking Active   levothyroxine (SYNTHROID) 100 MCG tablet [id number] Yes Take 1 tablet (100 mcg total) by mouth daily before breakfast. Colon Branch, MD Taking Active   metoprolol succinate (TOPROL-XL) 50 MG 24 hr tablet [id number] Yes Take 1 tablet (50 mg total) by mouth daily. Take with or immediately following a meal. Colon Branch, MD Taking Active   Multiple Vitamins-Minerals (MULTIVITAMIN PO) [id number] Yes Take 1 tablet by mouth at bedtime.  [provider] Taking Active    simvastatin (ZOCOR) 40 MG tablet [id number] Yes Take 1 tablet (40 mg total) by mouth at bedtime. Colon Branch, MD Taking Active   Turmeric 500 MG CAPS [id number] Yes Take 500 mg by mouth daily. [provider] Taking Active   Zinc 50 MG TABS [id number] Yes Take 50 mg by mouth daily. [provider] Taking Active           Patient Active Problem List   Diagnosis Date Noted  . Subclinical hypothyroidism 12/10/2016  . PCP NOTES >>>>>>>>>>>>>>>>> 12/07/2015  . Lower urinary tract symptoms (LUTS) 10/11/2014  . Hyperglycemia 03/23/2014  . Edema 10/20/2013  . Skin lesion 10/20/2013  . Annual physical exam 01/24/2011  . Hyperlipidemia 03/23/2008  . Hypertension 04/15/2007  . COPD (chronic obstructive pulmonary disease) ([place]) 04/15/2007    Immunization History  Administered Date(s) Administered  . Fluad Quad(high Dose 65+) 01/15/2019  . Influenza Split 05/22/2012  . Influenza, High Dose Seasonal PF 02/10/2013, 03/11/2016, 01/29/2020  . Influenza,inj,Quad PF,6+ Mos 02/21/2014  . Influenza-Unspecified 02/11/2017, 02/10/2018  . PFIZER Comirnaty(Gray Top)Covid-19 Tri-Sucrose Vaccine 09/12/2020  . PFIZER(Purple Top)SARS-COV-2 Vaccination 06/03/2019, 06/24/2019, 04/11/2020  . Pneumococcal Conjugate-13 03/23/2014  . Pneumococcal Polysaccharide-23 04/15/2007, 04/20/2013  . Td 05/13/1998, 08/01/2008  . Tdap 05/13/2019  . Zoster Recombinat (Shingrix) 01/15/2019, 04/16/2019  . Zoster, Live 09/14/2013    Conditions to be addressed/monitored: HTN, HLD, COPD and pre DM; hypothyroidism; joint pain  Care Plan : [name] (Adult)  Updates made by Cherre Robins, PHARMD since 10/17/2020 12:00 AM    Problem: CHL AMB "PATIENT-SPECIFIC PROBLEM"     Long-Range Goal: Medication and Chronic Care Management   Start Date: 10/17/2020  This Visit's Progress: Not on track  Priority: High  Note:   Current Barriers:  . Unable to maintain control of hypertension . Does not adhere to  prescribed medication regimen . Does not contact provider office for questions/concerns  Pharmacist Clinical Goal(s):  Marland Kitchen Over the next 180 days, patient will achieve adherence to monitoring guidelines and medication adherence to achieve therapeutic efficacy . maintain control of HTN as evidenced by BP <140/90  . adhere to prescribed medication regimen as evidenced by fill history . contact provider office for questions/concerns as evidenced notation of same in electronic  health record through collaboration with PharmD and provider.   Interventions: . 1:1 collaboration with Colon Branch, MD regarding development and update of comprehensive plan of care as evidenced by provider attestation and co-signature . Inter-disciplinary care team collaboration (see longitudinal plan of care) . Comprehensive medication review performed; medication list updated in electronic medical record  Hypertension: . Uncontrolled per last office visit BP readings though home BP reading a little better; blood pressure goal <140/90 . Current treatment: Kevin Metoprolol succinate ER 75m daily  . Previous  medications tried: amlodipine 537m- stopped due to increased LEE . Current home readings: SBP 127 to 140 and DBP 79 to 85 . Denies hypotensive/hypertensive symptoms . Interventions:  Kevin Patient to continue to check blood pressure 2 to 3 times per week Kevin Discussed blood pressure goal Kevin In future if BP still above goal could add diuretic like HCTZ or spironolactone since he has edema as well.   Hyperlipidemia: . Controlled; LDL goal <100 . Current treatment: Kevin Simvastatin 4063maily  . Interventions:  Kevin Discussed last lipid panel and LDL goal Kevin Continue current regimen for lipids  Chronic Obstructive Pulmonary Disease: . Controlled; patient reports no need for rescue inhaler in over a year and not SOB or wheezing.  . Current treatment: Kevin Prescribed Flovent HFA 73m59mnd albuterol HFA but patient reports he has  not used either in over a year and does not feel that he needs them . COPD class A . No history of PFTs;  . Last visit with pulmonologist, Dr OlalAnder [name] 02/2019  . One episode of acute bronchitis 03/19/2019.   . InMarland Kitchenerventions:  Kevin Recommend he consider follow up with pulmonologist to evaluate lung function  Kevin Keep albuterol inhaler on hand for rescue  Medication Management:  . [facility] . Reviewed medications and updated medication list to add vitamins and herbal medications patient has recently started - turmeric, Krill oil and zinc.  . Discussed adherence  Patient Goals/Self-Care Activities . Over the next 180 days, patient will:  Kevin take medications as prescribed,  Kevin check blood pressure 2 to 3 times per week, document, and provide at future appointments, and  Kevin target a minimum of 150 minutes of moderate intensity exercise weekly  Follow Up Plan: The care management team will reach out to the patient again over the next 180 days.        Medication Assistance: None required.  Patient affirms current coverage meets needs.  [id number] [place] [facility] [id number]: [phone] Up:  Patient agrees to Care Plan and Follow-up.  Plan: The care management team will reach out to the patient again over the next 180 days.  TammyCherre RobinsrmD Clinical Pharmacist [phone]

## 2020-11-06 ENCOUNTER — Other Ambulatory Visit: Payer: Self-pay | Admitting: Internal Medicine

## 2020-11-07 ENCOUNTER — Other Ambulatory Visit: Payer: Self-pay | Admitting: Internal Medicine

## 2020-11-24 ENCOUNTER — Encounter: Payer: Self-pay | Admitting: Internal Medicine

## 2020-11-30 ENCOUNTER — Other Ambulatory Visit (INDEPENDENT_AMBULATORY_CARE_PROVIDER_SITE_OTHER): Payer: PPO

## 2020-11-30 ENCOUNTER — Other Ambulatory Visit: Payer: Self-pay

## 2020-11-30 DIAGNOSIS — E038 Other specified hypothyroidism: Secondary | ICD-10-CM | POA: Diagnosis not present

## 2020-11-30 LAB — TSH: TSH: 5.21 u[IU]/mL (ref 0.35–5.50)

## 2020-12-04 ENCOUNTER — Other Ambulatory Visit: Payer: Self-pay

## 2020-12-04 MED ORDER — LEVOTHYROXINE SODIUM 125 MCG PO TABS: 125.0000 ug | ORAL_TABLET | Freq: Every day | ORAL | 3 refills | Status: DC

## 2020-12-11 ENCOUNTER — Telehealth: Payer: Self-pay | Admitting: Pharmacist

## 2020-12-11 NOTE — Chronic Care Management (AMB) (Unsigned)
Chronic Care Management Pharmacy Assistant   Name: Kevin [name]  MRN: [id number] DOB: March 01, 1946  Reason for Encounter: Disease State Hypertension   Recent office visits:  12/04/20 [place] only:  Patient levothyroxine was increased to 125 MCG tablet daily.  Recent consult visits:  None Noted  Hospital visits:  None in previous 6 months  Medications: Outpatient Encounter Medications as of 12/11/2020  Medication Sig   metoprolol succinate (TOPROL-XL) 50 MG 24 hr tablet Take 1 tablet (50 mg total) by mouth daily.   albuterol (VENTOLIN HFA) 108 (90 Base) MCG/ACT inhaler Inhale 2 puffs into the lungs every 6 (six) hours as needed for wheezing or shortness of breath. (Patient not taking: Reported on 10/17/2020)   amLODipine (NORVASC) 5 MG tablet Take 1 tablet (5 mg total) by mouth daily. (Patient not taking: Reported on 10/17/2020)   aspirin EC 81 MG tablet Take 81 mg by mouth daily. Swallow whole.   calcium carbonate (OS-CAL) 600 MG TABS tablet Take 600 mg by mouth at bedtime.    Cyanocobalamin (VITAMIN B 12 PO) Take 1,000 mcg by mouth daily.   fluticasone (FLOVENT HFA) 44 MCG/ACT inhaler Inhale 2 puffs into the lungs 2 (two) times daily as needed. (Patient not taking: Reported on 10/17/2020)   Krill Oil 350 MG CAPS Take 1 capsule by mouth daily.   levothyroxine (SYNTHROID) 125 MCG tablet Take 1 tablet (125 mcg total) by mouth daily.   Multiple Vitamins-Minerals (MULTIVITAMIN PO) Take 1 tablet by mouth at bedtime.    simvastatin (ZOCOR) 40 MG tablet Take 1 tablet (40 mg total) by mouth at bedtime.   Turmeric 500 MG CAPS Take 500 mg by mouth daily.   Zinc 50 MG TABS Take 50 mg by mouth daily.   No facility-administered encounter medications on file as of 12/11/2020.    Reviewed chart prior to disease state call. Spoke with patient regarding BP  Recent Office Vitals: BP Readings from Last 3 Encounters:  10/12/20 (!) 141/74  08/31/20 (!) 146/84  03/01/20 (!) 160/82    Pulse Readings from Last 3 Encounters:  10/12/20 67  08/31/20 68  03/01/20 63    Wt Readings from Last 3 Encounters:  08/31/20 181 lb 8 oz (82.3 kg)  03/01/20 185 lb 9.6 oz (84.2 kg)  03/19/19 180 lb 4 oz (81.8 kg)     Kidney Function Lab Results  Component Value Date/Time   CREATININE 1.01 03/01/2020 03:02 PM   CREATININE 1.05 12/18/2018 09:58 AM   CREATININE 0.97 12/12/2017 09:43 AM   GFR 68.83 12/18/2018 09:58 AM   GFRNONAA 90.88 11/15/2009 10:33 AM   GFRAA 97 04/15/2007 01:54 PM    BMP Latest Ref Rng & Units 03/01/2020 12/18/2018 12/12/2017  Glucose 65 - 99 mg/dL 87 93 100(H)  BUN 7 - 25 mg/dL '13 14 17  '$ Creatinine 0.70 - 1.18 mg/dL 1.01 1.05 0.97  BUN/Creat Ratio 6 - 22 (calc) NOT APPLICABLE - -  Sodium A999333 - 146 mmol/L 140 140 139  Potassium 3.5 - 5.3 mmol/L 4.9 5.2 No hemolysis seen(H) 5.2(H)  Chloride 98 - 110 mmol/L 103 102 101  CO2 20 - 32 mmol/L 30 31 33(H)  Calcium 8.6 - 10.3 mg/dL 9.4 9.8 9.7    Current antihypertensive regimen:  Metoprolol succinate ER '50mg'$  daily  How often are you checking your Blood Pressure? {CHL HP BP Monitoring Frequency:[phone]}  Current home BP readings: ***  What recent interventions/DTPs have been made by any provider to improve Blood  Pressure control since last CPP Visit: ***  Any recent hospitalizations or ED visits since last visit with CPP? No  What diet changes have been made to improve Blood Pressure Control?  ***  What exercise is being done to improve your Blood Pressure Control?  ***  Adherence Review: Is the patient currently on ACE/ARB medication? No Does the patient have >5 day gap between last estimated fill dates? No     Star Rating Drugs: Simvastatin 40 mg last filled 10/30/2020 90 DS  [place] Clinical Pharmacist Assistant [phone]

## 2020-12-12 ENCOUNTER — Telehealth: Payer: Self-pay | Admitting: Internal Medicine

## 2020-12-12 NOTE — Telephone Encounter (Signed)
Also to be filled out by provider.

## 2020-12-12 NOTE — Telephone Encounter (Signed)
Pt dropped off copy of Primary care Document (1 page- yellow) Pt would like to have on chart. Document put at front office tray.

## 2020-12-12 NOTE — Telephone Encounter (Signed)
Received- form is a note from home [facility]. Will place in PCP folder.

## 2021-01-08 ENCOUNTER — Other Ambulatory Visit: Payer: Self-pay

## 2021-01-08 DIAGNOSIS — Z586 Inadequate drinking-water supply: Secondary | ICD-10-CM

## 2021-01-08 DIAGNOSIS — Z5948 Other specified lack of adequate food: Secondary | ICD-10-CM

## 2021-01-09 ENCOUNTER — Other Ambulatory Visit: Payer: Self-pay

## 2021-01-09 ENCOUNTER — Telehealth: Payer: Self-pay | Admitting: *Deleted

## 2021-01-09 DIAGNOSIS — I10 Essential (primary) hypertension: Secondary | ICD-10-CM

## 2021-01-09 NOTE — Chronic Care Management (AMB) (Unsigned)
      Chronic Care Management Pharmacy Assistant   Name: Kevin [name]  MRN: [id number] DOB: Jun 20, 1945  Follow up on assessment call from 12/11/20.   Reviewed chart prior to disease state call. Spoke with patient regarding BP  Recent Office Vitals: BP Readings from Last 3 Encounters:  10/12/20 (!) 141/74  08/31/20 (!) 146/84  03/01/20 (!) 160/82   Pulse Readings from Last 3 Encounters:  10/12/20 67  08/31/20 68  03/01/20 63    Wt Readings from Last 3 Encounters:  08/31/20 181 lb 8 oz (82.3 kg)  03/01/20 185 lb 9.6 oz (84.2 kg)  03/19/19 180 lb 4 oz (81.8 kg)     Kidney Function Lab Results  Component Value Date/Time   CREATININE 1.01 03/01/2020 03:02 PM   CREATININE 1.05 12/18/2018 09:58 AM   CREATININE 0.97 12/12/2017 09:43 AM   GFR 68.83 12/18/2018 09:58 AM   GFRNONAA 90.88 11/15/2009 10:33 AM   GFRAA 97 04/15/2007 01:54 PM    BMP Latest Ref Rng & Units 03/01/2020 12/18/2018 12/12/2017  Glucose 65 - 99 mg/dL 87 93 100(H)  BUN 7 - 25 mg/dL '13 14 17  '$ Creatinine 0.70 - 1.18 mg/dL 1.01 1.05 0.97  BUN/Creat Ratio 6 - 22 (calc) NOT APPLICABLE - -  Sodium A999333 - 146 mmol/L 140 140 139  Potassium 3.5 - 5.3 mmol/L 4.9 5.2 No hemolysis seen(H) 5.2(H)  Chloride 98 - 110 mmol/L 103 102 101  CO2 20 - 32 mmol/L 30 31 33(H)  Calcium 8.6 - 10.3 mg/dL 9.4 9.8 9.7    Current antihypertensive regimen:  Metoprolol Succinate ER 50 mg daily  How often are you checking your Blood Pressure? twice daily Current home BP readings: N/a, Patient states he does not keep a log.  What recent interventions/DTPs have been made by any provider to improve Blood Pressure control since last CPP Visit: N/a Any recent hospitalizations or ED visits since last visit with CPP? No What diet changes have been made to improve Blood Pressure Control?   Patient states that he is not on a diet regimen.  What exercise is being done to improve your Blood Pressure Control?  Patient states that he goes to the  [facility] often and walks a mile on the treadmill.   Adherence Review: Is the patient currently on ACE/ARB medication? No Does the patient have >5 day gap between last estimated fill dates? No   Andee Poles, [place]

## 2021-01-09 NOTE — Chronic Care Management (AMB) (Signed)
  Chronic Care Management   Note  01/09/2021 Name: Kevin [name] MRN: [id number] DOB: 02/09/1946  Kevin [name] is a 75 y.o. year old male who is a primary care patient of Colon Branch, MD. Kevin [name] is currently enrolled in care management services. An additional referral for Licensed Clinical SW was placed.   Follow up plan: Telephone appointment with care management team member scheduled for: 01/18/2021  Julian Hy, [place] Management  Direct Dial: [phone]

## 2021-01-16 ENCOUNTER — Telehealth: Payer: Self-pay | Admitting: *Deleted

## 2021-01-16 DIAGNOSIS — E038 Other specified hypothyroidism: Secondary | ICD-10-CM

## 2021-01-16 NOTE — Telephone Encounter (Signed)
TSH ordered.

## 2021-01-16 NOTE — Telephone Encounter (Signed)
Pt has lab appointment on Thursday. Scheduling note says TSH but I do not see a future order in Epic.  Please place order if appropriate.

## 2021-01-18 ENCOUNTER — Other Ambulatory Visit: Payer: Self-pay

## 2021-01-18 ENCOUNTER — Telehealth: Payer: PPO

## 2021-01-18 ENCOUNTER — Other Ambulatory Visit (INDEPENDENT_AMBULATORY_CARE_PROVIDER_SITE_OTHER): Payer: PPO

## 2021-01-18 DIAGNOSIS — E038 Other specified hypothyroidism: Secondary | ICD-10-CM

## 2021-01-18 LAB — TSH: TSH: 0.05 u[IU]/mL — ABNORMAL LOW (ref 0.35–5.50)

## 2021-01-22 ENCOUNTER — Telehealth: Payer: Self-pay | Admitting: Internal Medicine

## 2021-01-22 MED ORDER — LEVOTHYROXINE SODIUM 112 MCG PO TABS: 112.0000 ug | ORAL_TABLET | Freq: Every day | ORAL | 0 refills | Status: DC

## 2021-01-22 NOTE — Telephone Encounter (Signed)
Patient called in regards of his levothyroxine (SYNTHROID) 112 MCG tablet. He states he used to take 100 mcg dosage, and then it went up to 133mg. But today when he went to pick it up, it said the dosage was 112 mcg. Please advise.

## 2021-01-22 NOTE — Addendum Note (Signed)
Addended byDamita Dunnings D on: 01/22/2021 09:17 AM   Modules accepted: Orders

## 2021-01-22 NOTE — Telephone Encounter (Signed)
Spoke w/ Pt- made him aware of lab results and recommendations. Pt verbalized understanding.

## 2021-01-23 ENCOUNTER — Telehealth: Payer: Self-pay

## 2021-01-23 NOTE — Telephone Encounter (Signed)
   Telephone encounter was:  Successful.  01/23/2021 Name: Kevin [name] MRN: [id number] DOB: 12/06/1945  Kevin [name] is a 75 y.o. year old male who is a primary care patient of Larose Kells, Alda Berthold, MD . The community resource team was consulted for assistance with  food pantries, energy assistance and food stampsl  Care guide performed the following interventions: Spoke with patient confirmed mailing address. Sent patient resource letter for [employer], food pantries and food stamps. Letter saved in Epic.  Follow Up Plan:  Care guide will follow up with patient by phone over the next 7 days.  Arneta Mahmood, AAS Paralegal, [facility]  [address], [place] [address] ??[email or web address]  ?? [phone]   [email or web address]

## 2021-01-24 DIAGNOSIS — D225 Melanocytic nevi of trunk: Secondary | ICD-10-CM | POA: Diagnosis not present

## 2021-01-24 DIAGNOSIS — L821 Other seborrheic keratosis: Secondary | ICD-10-CM | POA: Diagnosis not present

## 2021-01-24 DIAGNOSIS — L820 Inflamed seborrheic keratosis: Secondary | ICD-10-CM | POA: Diagnosis not present

## 2021-01-24 DIAGNOSIS — L570 Actinic keratosis: Secondary | ICD-10-CM | POA: Diagnosis not present

## 2021-01-24 DIAGNOSIS — X32XXXA Exposure to sunlight, initial encounter: Secondary | ICD-10-CM | POA: Diagnosis not present

## 2021-01-26 ENCOUNTER — Other Ambulatory Visit: Payer: Self-pay | Admitting: Internal Medicine

## 2021-01-27 ENCOUNTER — Ambulatory Visit (INDEPENDENT_AMBULATORY_CARE_PROVIDER_SITE_OTHER): Payer: PPO

## 2021-01-27 VITALS — Ht 69.0 in | Wt 180.0 lb

## 2021-01-27 DIAGNOSIS — Z0000 Encounter for general adult medical examination without abnormal findings: Secondary | ICD-10-CM | POA: Diagnosis not present

## 2021-01-27 DIAGNOSIS — Z599 Problem related to housing and economic circumstances, unspecified: Secondary | ICD-10-CM

## 2021-01-27 NOTE — Progress Notes (Addendum)
Subjective:   Kevin [name] is a 75 y.o. male who presents for Medicare Annual/Subsequent preventive examination.  Virtual Visit via Telephone Note  I connected with  [name] on 01/27/21 at 12:00 PM EDT by telephone and verified that I am speaking with the correct person using two identifiers.  Location: Patient: Home Provider: [facility] Persons participating in the virtual visit: patient/Nurse Health Advisor   I discussed the limitations, risks, security and privacy concerns of performing an evaluation and management service by telephone and the availability of in person appointments. The patient expressed understanding and agreed to proceed.  Interactive audio and video telecommunications were attempted between this nurse and patient, however failed, due to patient having technical difficulties OR patient did not have access to video capability.  We continued and completed visit with audio only.  Some vital signs may be absent or patient reported.   Sheralyn Pinegar E Arlin Sass, LPN   Review of Systems     Cardiac Risk Factors include: advanced age (>51mn, >>73women);dyslipidemia;male gender;hypertension;sedentary lifestyle;Other (see comment), Risk factor comments: COPD     Objective:    Today's Vitals   01/27/21 1205  Weight: 180 lb (81.6 kg)  Height: '5\' 9"'$  (1.753 m)   Body mass index is 26.58 kg/m.  Advanced Directives 01/27/2021 12/18/2018 12/12/2017 12/10/2016  Does Patient Have a Medical Advance Directive? No No No No  Would patient like information on creating a medical advance directive? No - Patient declined No - Patient declined No - Patient declined Yes (MAU/Ambulatory/Procedural Areas - Information given)    Current Medications (verified) Outpatient Encounter Medications as of 01/27/2021  Medication Sig   aspirin EC 81 MG tablet Take 81 mg by mouth daily. Swallow whole.   calcium carbonate (OS-CAL) 600 MG TABS tablet Take 600 mg by mouth at bedtime.    Cyanocobalamin  (VITAMIN B 12 PO) Take 1,000 mcg by mouth daily.   Krill Oil 350 MG CAPS Take 1 capsule by mouth daily.   levothyroxine (SYNTHROID) 112 MCG tablet Take 1 tablet (112 mcg total) by mouth daily before breakfast.   levothyroxine (SYNTHROID) 75 MCG tablet Take 75 mcg by mouth daily.   metoprolol succinate (TOPROL-XL) 50 MG 24 hr tablet Take 1 tablet (50 mg total) by mouth daily.   Multiple Vitamins-Minerals (MULTIVITAMIN PO) Take 1 tablet by mouth at bedtime.    simvastatin (ZOCOR) 40 MG tablet TAKE 1 TABLET BY MOUTH EVERYDAY AT BEDTIME   Turmeric 500 MG CAPS Take 500 mg by mouth daily.   Zinc 50 MG TABS Take 50 mg by mouth daily.   albuterol (VENTOLIN HFA) 108 (90 Base) MCG/ACT inhaler Inhale 2 puffs into the lungs every 6 (six) hours as needed for wheezing or shortness of breath. (Patient not taking: No sig reported)   amLODipine (NORVASC) 5 MG tablet Take 1 tablet (5 mg total) by mouth daily. (Patient not taking: No sig reported)   fluticasone (FLOVENT HFA) 44 MCG/ACT inhaler Inhale 2 puffs into the lungs 2 (two) times daily as needed. (Patient not taking: No sig reported)   No facility-administered encounter medications on file as of 01/27/2021.    Allergies (verified) Bromide ion [bromine] and Tiotropium bromide monohydrate   History: Past Medical History:  Diagnosis Date   COPD (chronic obstructive pulmonary disease) ([facility]    PFTs 01-2006 mild obstruction, (+) reponse to Bronchodilators     Hyperlipidemia    Hypertension    Shingles    Thorax, 2008   Thyroid disease  Past Surgical History:  Procedure Laterality Date   COLONOSCOPY  11/16/2008   Sharlett Iles   MULTIPLE TOOTH EXTRACTIONS     dentures upper and partial lower   NO PAST SURGERIES     Family History  Problem Relation Age of Onset   CAD Father        F MI ~ 33 y/o   Hypertension Mother        M and S   Colon cancer Neg Hx    Prostate cancer Neg Hx    Diabetes Neg Hx    Rectal cancer Neg Hx    Stomach cancer Neg  Hx    Social History   Socioeconomic History   Marital status: Married    Spouse name: Not on file   Number of children: 3   Years of education: Not on file   Highest education level: Not on file  Occupational History   Occupation: retired 01-2013    Employer: [employer]  Tobacco Use   Smoking status: Former    Types: Cigarettes    Start date: 1960    Quit date: 05/13/2004    Years since quitting: 16.7   Smokeless tobacco: Never   Tobacco comments:    used to smoke 2 ppd   Vaping Use   Vaping Use: Never used  Substance and Sexual Activity   Alcohol use: Yes    Alcohol/week: 7.0 - 14.0 standard drinks    Types: 7 - 14 Cans of beer per week    Comment: 2-3  beers some days   Drug use: No   Sexual activity: Not Currently  Other Topics Concern   Not on file  Social History Narrative   Lives w/ wife   3 children   Daughter lives in [place], Son in [place]   Social Determinants of Health   Financial Resource Strain: Medium Risk   Difficulty of Paying Living Expenses: Somewhat hard  Food Insecurity: Food Insecurity Present   Worried About Charity fundraiser in the Last Year: Sometimes true   Arboriculturist in the Last Year: Sometimes true  Transportation Needs: No Transportation Needs   Lack of Transportation (Medical): No   Lack of Transportation (Non-Medical): No  Physical Activity: Insufficiently Active   Days of Exercise per Week: 1 day   Minutes of Exercise per Session: 60 min  Stress: No Stress Concern Present   Feeling of Stress : Not at all  Social Connections: Socially Integrated   Frequency of Communication with Friends and Family: More than three times a week   Frequency of Social Gatherings with Friends and Family: Twice a week   Attends Religious Services: More than 4 times per year   Active Member of [employer] or Organizations: Yes   Attends Music therapist: More than 4 times per year   Marital Status: Married    Tobacco Counseling Counseling given: Not  Answered Tobacco comments: used to smoke 2 ppd    Clinical Intake:  Pre-visit preparation completed: Yes  Pain : No/denies pain     BMI - recorded: 26.58 Nutritional Status: BMI 25 -29 Overweight Nutritional Risks: None Diabetes: No  How often do you need to have someone help you when you read instructions, pamphlets, or other written materials from your doctor or pharmacy?: 1 - Never  Diabetic? no  Interpreter Needed?: No  Information entered by :: Prynce Jacober, LPN   Activities of Daily Living In your present state of health, do you have  any difficulty performing the following activities: 01/27/2021 03/01/2020  Hearing? N N  Vision? N N  Difficulty concentrating or making decisions? N N  Walking or climbing stairs? N N  Dressing or bathing? N N  Doing errands, shopping? N N  Preparing Food and eating ? N -  Using the Toilet? N -  In the past six months, have you accidently leaked urine? N -  Do you have problems with loss of bowel control? N -  Managing your Medications? N -  Managing your Finances? N -  Housekeeping or managing your Housekeeping? N -  Some recent data might be hidden    Patient Care Team: Colon Branch, MD as PCP - General Danis, Kirke Corin, MD as Consulting Physician (Gastroenterology) [place], Jennefer Bravo, MD as Referring Physician (Dermatology) Cherre Robins, PharmD (Pharmacist)  Indicate any recent Medical Services you may have received from other than [facility] providers in the past year (date may be approximate).     Assessment:   This is a routine wellness examination for Sirr.  Hearing/Vision screen Hearing Screening - Comments:: Denies hearing difficulties  Vision Screening - Comments:: Wears glasses - behind on annual eye exams at [facility] issues and exercise activities discussed: Current Exercise Habits: Home exercise routine, Type of exercise: strength training/weights;walking, Time (Minutes): 60, Frequency  (Times/Week): 1, Weekly Exercise (Minutes/Week): 60, Intensity: Moderate, Exercise limited by: respiratory conditions(s)   Goals Addressed               This Visit's Progress     A1c goal less than 6.5%   On track     -Pre-Diabetes a1c range is 5.7% to 6.4%. Your most recent a1c was 5.7% on 12/18/2018.  -Usually the full diabetes diagnosis is given when a1c reaches 6.5% or higher.        Maintain current lifestyle (pt-stated)   On track      Depression Screen PHQ 2/9 Scores 01/27/2021 03/01/2020 12/18/2018 12/18/2018 12/12/2017 12/10/2016 12/07/2015  PHQ - 2 Score 0 0 0 0 0 0 0    Fall Risk Fall Risk  01/27/2021 08/31/2020 03/01/2020 12/18/2018 12/18/2018  Falls in the past year? 0 0 0 0 0  Number falls in past yr: 0 0 0 - -  Injury with Fall? 0 0 0 - -  Risk for fall due to : No Fall Risks - - - -  Follow up Falls prevention discussed Falls evaluation completed Falls evaluation completed - -    FALL RISK PREVENTION PERTAINING TO THE HOME:  Any stairs in or around the home? Yes  If so, are there any without handrails? No  Home free of loose throw rugs in walkways, pet beds, electrical cords, etc? Yes  Adequate lighting in your home to reduce risk of falls? Yes   ASSISTIVE DEVICES UTILIZED TO PREVENT FALLS:  Life alert? No  Use of a cane, walker or w/c? No  Grab bars in the bathroom? No  Shower chair or bench in shower? No  Elevated toilet seat or a handicapped toilet? No   TIMED UP AND GO:  Was the test performed? No . Telephonic visit  Cognitive Function: Normal cognitive status assessed by direct observation by this Nurse Health Advisor. No abnormalities found.    MMSE - Mini Mental State Exam 12/10/2016  Orientation to time 5  Orientation to Place 5  Registration 3  Attention/ Calculation 4  Recall 2  Language- name 2 objects 2  Language- repeat 1  Language- follow 3 step command 3  Language- read & follow direction 1  Write a sentence 1  Copy design 1  Total score  28        Immunizations Immunization History  Administered Date(s) Administered   Fluad Quad(high Dose 65+) 01/15/2019   Influenza Split 05/22/2012   Influenza, High Dose Seasonal PF 02/10/2013, 03/11/2016, 01/29/2020, 01/22/2021   Influenza,inj,Quad PF,6+ Mos 02/21/2014   Influenza-Unspecified 02/11/2017, 02/10/2018   PFIZER Comirnaty(Gray Top)Covid-19 Tri-Sucrose Vaccine 09/12/2020   PFIZER(Purple Top)SARS-COV-2 Vaccination 06/03/2019, 06/24/2019, 04/11/2020   Pneumococcal Conjugate-13 03/23/2014   Pneumococcal Polysaccharide-23 04/15/2007, 04/20/2013   Td 05/13/1998, 08/01/2008   Tdap 05/13/2019   Zoster Recombinat (Shingrix) 01/15/2019, 04/16/2019   Zoster, Live 09/14/2013    TDAP status: Up to date  Flu Vaccine status: Up to date  Pneumococcal vaccine status: Up to date  Covid-19 vaccine status: Completed vaccines  Qualifies for Shingles Vaccine? Yes   Zostavax completed Yes   Shingrix Completed?: Yes  Screening Tests Health Maintenance  Topic Date Due   COLONOSCOPY (Pts 45-11yr Insurance coverage will need to be confirmed)  01/24/2022   TETANUS/TDAP  05/12/2029   INFLUENZA VACCINE  Completed   COVID-19 Vaccine  Completed   Hepatitis C Screening  Completed   Zoster Vaccines- Shingrix  Completed   HPV VACCINES  Aged Out    Health Maintenance  There are no preventive care reminders to display for this patient.  Colorectal cancer screening: Type of screening: Colonoscopy. Completed 01/25/2019. Repeat every 3 years  Lung Cancer Screening: (Low Dose CT Chest recommended if Age 77-80years, 30 pack-year currently smoking OR have quit w/in 15years.) does not qualify.  Additional Screening:  Hepatitis C Screening: does qualify; Completed 12/12/2017  Vision Screening: Recommended annual ophthalmology exams for early detection of glaucoma and other disorders of the eye. Is the patient up to date with their annual eye exam?  No  Who is the provider or what is the  name of the office in which the patient attends annual eye exams? [facility] pt is not established with a provider, would they like to be referred to a provider to establish care? No .   Dental Screening: Recommended annual dental exams for proper oral hygiene  Community Resource Referral / Chronic Care Management: CRR required this visit?  No   CCM required this visit?  No      Plan:     I have personally reviewed and noted the following in the patient's chart:   Medical and social history Use of alcohol, tobacco or illicit drugs  Current medications and supplements including opioid prescriptions. Patient is not currently taking opioid prescriptions. Functional ability and status Nutritional status Physical activity Advanced directives List of other physicians Hospitalizations, surgeries, and ER visits in previous 12 months Vitals Screenings to include cognitive, depression, and falls Referrals and appointments  In addition, I have reviewed and discussed with patient certain preventive protocols, quality metrics, and best practice recommendations. A written personalized care plan for preventive services as well as general preventive health recommendations were provided to patient.     ASandrea Hammond LPN   [id number]  Nurse Notes: They have financial difficulties; CRR to assist with utility bill - they do go to local food bank a few times per month prn - I sent a referral for his wife as she cannot afford her inhaler.  I have reviewed and agree with [facility] documentation.  JKathlene November MD

## 2021-01-27 NOTE — Patient Instructions (Signed)
[name] , Thank you for taking time to come for your Medicare Wellness Visit. I appreciate your ongoing commitment to your health goals. Please review the following plan we discussed and let me know if I can assist you in the future.   Screening recommendations/referrals: Colonoscopy: Done 01/25/2019 - Repeat in 3 years Recommended yearly ophthalmology/optometry visit for glaucoma screening and checkup Recommended yearly dental visit for hygiene and checkup  Vaccinations: Influenza vaccine: Done 01/22/2021 - Repeat annually  Pneumococcal vaccine: Done 04/20/2013 & 03/23/2014 Tdap vaccine: Done 05/13/2019 - Repeat in 10 years Shingles vaccine: Done 01/15/2019 & 04/16/2019   Covid-19: Done 06/03/19, 06/24/19, 04/11/20,& 09/12/2020  Advanced directives: Please bring a copy of your health care power of attorney and living will to the office to be added to your chart at your convenience.   Conditions/risks identified: Aim for 30 minutes of exercise or brisk walking each day, drink 6-8 glasses of water and eat lots of fruits and vegetables.   Next appointment: Follow up in one year for your annual wellness visit.   Preventive Care 77 Years and Older, Male  Preventive care refers to lifestyle choices and visits with your health care provider that can promote health and wellness. What does preventive care include? A yearly physical exam. This is also called an annual well check. Dental exams once or twice a year. Routine eye exams. Ask your health care provider how often you should have your eyes checked. Personal lifestyle choices, including: Daily care of your teeth and gums. Regular physical activity. Eating a healthy diet. Avoiding tobacco and drug use. Limiting alcohol use. Practicing safe sex. Taking low doses of aspirin every day. Taking vitamin and mineral supplements as recommended by your health care provider. What happens during an annual well check? The services and screenings done  by your health care provider during your annual well check will depend on your age, overall health, lifestyle risk factors, and family history of disease. Counseling  Your health care provider may ask you questions about your: Alcohol use. Tobacco use. Drug use. Emotional well-being. Home and relationship well-being. Sexual activity. Eating habits. History of falls. Memory and ability to understand (cognition). Work and work Statistician. Screening  You may have the following tests or measurements: Height, weight, and BMI. Blood pressure. Lipid and cholesterol levels. These may be checked every 5 years, or more frequently if you are over 26 years old. Skin check. Lung cancer screening. You may have this screening every year starting at age 9 if you have a 30-pack-year history of smoking and currently smoke or have quit within the past 15 years. Fecal occult blood test (FOBT) of the stool. You may have this test every year starting at age 76. Flexible sigmoidoscopy or colonoscopy. You may have a sigmoidoscopy every 5 years or a colonoscopy every 10 years starting at age 50. Prostate cancer screening. Recommendations will vary depending on your family history and other risks. Hepatitis C blood test. Hepatitis B blood test. Sexually transmitted disease (STD) testing. Diabetes screening. This is done by checking your blood sugar (glucose) after you have not eaten for a while (fasting). You may have this done every 1-3 years. Abdominal aortic aneurysm (AAA) screening. You may need this if you are a current or former smoker. Osteoporosis. You may be screened starting at age 61 if you are at high risk. Talk with your health care provider about your test results, treatment options, and if necessary, the need for more tests. Vaccines  Your  health care provider may recommend certain vaccines, such as: Influenza vaccine. This is recommended every year. Tetanus, diphtheria, and acellular  pertussis (Tdap, Td) vaccine. You may need a Td booster every 10 years. Zoster vaccine. You may need this after age 40. Pneumococcal 13-valent conjugate (PCV13) vaccine. One dose is recommended after age 72. Pneumococcal polysaccharide (PPSV23) vaccine. One dose is recommended after age 67. Talk to your health care provider about which screenings and vaccines you need and how often you need them. This information is not intended to replace advice given to you by your health care provider. Make sure you discuss any questions you have with your health care provider. Document Released: 05/26/2015 Document Revised: 01/17/2016 Document Reviewed: 02/28/2015 Elsevier Interactive Patient Education  2017 [employer] Prevention in the Home Falls can cause injuries. They can happen to people of all ages. There are many things you can do to make your home safe and to help prevent falls. What can I do on the outside of my home? Regularly fix the edges of walkways and driveways and fix any cracks. Remove anything that might make you trip as you walk through a door, such as a raised step or threshold. Trim any bushes or trees on the path to your home. Use bright outdoor lighting. Clear any walking paths of anything that might make someone trip, such as rocks or tools. Regularly check to see if handrails are loose or broken. Make sure that both sides of any steps have handrails. Any raised decks and porches should have guardrails on the edges. Have any leaves, snow, or ice cleared regularly. Use sand or salt on walking paths during winter. Clean up any spills in your garage right away. This includes oil or grease spills. What can I do in the bathroom? Use night lights. Install grab bars by the toilet and in the tub and shower. Do not use towel bars as grab bars. Use non-skid mats or decals in the tub or shower. If you need to sit down in the shower, use a plastic, non-slip stool. Keep the floor  dry. Clean up any water that spills on the floor as soon as it happens. Remove soap buildup in the tub or shower regularly. Attach bath mats securely with double-sided non-slip rug tape. Do not have throw rugs and other things on the floor that can make you trip. What can I do in the bedroom? Use night lights. Make sure that you have a light by your bed that is easy to reach. Do not use any sheets or blankets that are too big for your bed. They should not hang down onto the floor. Have a firm chair that has side arms. You can use this for support while you get dressed. Do not have throw rugs and other things on the floor that can make you trip. What can I do in the kitchen? Clean up any spills right away. Avoid walking on wet floors. Keep items that you use a lot in easy-to-reach places. If you need to reach something above you, use a strong step stool that has a grab bar. Keep electrical cords out of the way. Do not use floor polish or wax that makes floors slippery. If you must use wax, use non-skid floor wax. Do not have throw rugs and other things on the floor that can make you trip. What can I do with my stairs? Do not leave any items on the stairs. Make sure that there are handrails  on both sides of the stairs and use them. Fix handrails that are broken or loose. Make sure that handrails are as long as the stairways. Check any carpeting to make sure that it is firmly attached to the stairs. Fix any carpet that is loose or worn. Avoid having throw rugs at the top or bottom of the stairs. If you do have throw rugs, attach them to the floor with carpet tape. Make sure that you have a light switch at the top of the stairs and the bottom of the stairs. If you do not have them, ask someone to add them for you. What else can I do to help prevent falls? Wear shoes that: Do not have high heels. Have rubber bottoms. Are comfortable and fit you well. Are closed at the toe. Do not wear  sandals. If you use a stepladder: Make sure that it is fully opened. Do not climb a closed stepladder. Make sure that both sides of the stepladder are locked into place. Ask someone to hold it for you, if possible. Clearly mark and make sure that you can see: Any grab bars or handrails. First and last steps. Where the edge of each step is. Use tools that help you move around (mobility aids) if they are needed. These include: Canes. Walkers. Scooters. Crutches. Turn on the lights when you go into a dark area. Replace any light bulbs as soon as they burn out. Set up your furniture so you have a clear path. Avoid moving your furniture around. If any of your floors are uneven, fix them. If there are any pets around you, be aware of where they are. Review your medicines with your doctor. Some medicines can make you feel dizzy. This can increase your chance of falling. Ask your doctor what other things that you can do to help prevent falls. This information is not intended to replace advice given to you by your health care provider. Make sure you discuss any questions you have with your health care provider. Document Released: 02/23/2009 Document Revised: 10/05/2015 Document Reviewed: 06/03/2014 Elsevier Interactive Patient Education  2017 [employer].

## 2021-01-30 ENCOUNTER — Telehealth: Payer: Self-pay

## 2021-01-30 NOTE — Telephone Encounter (Signed)
   Telephone encounter was:  Successful.  01/30/2021 Name: TAYM TWIST MRN: [id number] DOB: 03-Mar-1946  ELICEO GLADU is a 75 y.o. year old male who is a primary care patient of Larose Kells, Alda Berthold, MD . The community resource team was consulted for assistance with energy assistance, food pantries and food stamps.  Care guide performed the following interventions: Spoke with patient he has received the resource letter with resources for energy assistance/application, food stamp application and food pantries. He plans on completing applications and mailing them this week.  Follow Up Plan:  No further follow up planned at this time. The patient has been provided with needed resources.  Johnathyn Viscomi, AAS Paralegal, [facility]  [address], [place] [address] ??[email or web address]  ?? [phone]   [email or web address]

## 2021-03-01 ENCOUNTER — Other Ambulatory Visit: Payer: Self-pay

## 2021-03-02 ENCOUNTER — Encounter: Payer: Self-pay | Admitting: Internal Medicine

## 2021-03-02 ENCOUNTER — Ambulatory Visit (INDEPENDENT_AMBULATORY_CARE_PROVIDER_SITE_OTHER): Payer: PPO | Admitting: Internal Medicine

## 2021-03-02 VITALS — BP 142/80 | HR 58 | Temp 98.1°F | Resp 16 | Ht 69.0 in | Wt 178.4 lb

## 2021-03-02 DIAGNOSIS — R739 Hyperglycemia, unspecified: Secondary | ICD-10-CM

## 2021-03-02 DIAGNOSIS — Z0000 Encounter for general adult medical examination without abnormal findings: Secondary | ICD-10-CM

## 2021-03-02 DIAGNOSIS — I10 Essential (primary) hypertension: Secondary | ICD-10-CM | POA: Diagnosis not present

## 2021-03-02 DIAGNOSIS — E7849 Other hyperlipidemia: Secondary | ICD-10-CM | POA: Diagnosis not present

## 2021-03-02 DIAGNOSIS — E039 Hypothyroidism, unspecified: Secondary | ICD-10-CM

## 2021-03-02 LAB — CBC WITH DIFFERENTIAL/PLATELET
Basophils Absolute: 0 10*3/uL (ref 0.0–0.1)
Basophils Relative: 0.5 % (ref 0.0–3.0)
Eosinophils Absolute: 0.2 10*3/uL (ref 0.0–0.7)
Eosinophils Relative: 5.4 % — ABNORMAL HIGH (ref 0.0–5.0)
HCT: 42.5 % (ref 39.0–52.0)
Hemoglobin: 14.2 g/dL (ref 13.0–17.0)
Lymphocytes Relative: 36.6 % (ref 12.0–46.0)
Lymphs Abs: 1.5 10*3/uL (ref 0.7–4.0)
MCHC: 33.3 g/dL (ref 30.0–36.0)
MCV: 93.5 fl (ref 78.0–100.0)
Monocytes Absolute: 0.5 10*3/uL (ref 0.1–1.0)
Monocytes Relative: 12.7 % — ABNORMAL HIGH (ref 3.0–12.0)
Neutro Abs: 1.8 10*3/uL (ref 1.4–7.7)
Neutrophils Relative %: 44.8 % (ref 43.0–77.0)
Platelets: 249 10*3/uL (ref 150.0–400.0)
RBC: 4.54 Mil/uL (ref 4.22–5.81)
RDW: 13 % (ref 11.5–15.5)
WBC: 4 10*3/uL (ref 4.0–10.5)

## 2021-03-02 LAB — COMPREHENSIVE METABOLIC PANEL
ALT: 22 U/L (ref 0–53)
AST: 22 U/L (ref 0–37)
Albumin: 4.3 g/dL (ref 3.5–5.2)
Alkaline Phosphatase: 60 U/L (ref 39–117)
BUN: 9 mg/dL (ref 6–23)
CO2: 33 mEq/L — ABNORMAL HIGH (ref 19–32)
Calcium: 9.6 mg/dL (ref 8.4–10.5)
Chloride: 102 mEq/L (ref 96–112)
Creatinine, Ser: 0.91 mg/dL (ref 0.40–1.50)
GFR: 81.38 mL/min (ref >60.00)
Glucose, Bld: 97 mg/dL (ref 70–99)
Potassium: 5.2 mEq/L — ABNORMAL HIGH (ref 3.5–5.1)
Sodium: 140 mEq/L (ref 135–145)
Total Bilirubin: 0.5 mg/dL (ref 0.2–1.2)
Total Protein: 7.1 g/dL (ref 6.0–8.3)

## 2021-03-02 LAB — LIPID PANEL
Cholesterol: 141 mg/dL (ref 0–200)
HDL: 47.3 mg/dL (ref >39.00)
LDL Cholesterol: 63 mg/dL (ref 0–99)
NonHDL: 93.27
Total CHOL/HDL Ratio: 3
Triglycerides: 153 mg/dL — ABNORMAL HIGH (ref 0.0–149.0)
VLDL: 30.6 mg/dL (ref 0.0–40.0)

## 2021-03-02 LAB — TSH: TSH: 0.06 u[IU]/mL — ABNORMAL LOW (ref 0.35–5.50)

## 2021-03-02 LAB — HEMOGLOBIN A1C: Hgb A1c MFr Bld: 5.8 % (ref 4.6–6.5)

## 2021-03-02 NOTE — Progress Notes (Signed)
Subjective:    Patient ID: Kevin [name], male    DOB: [birth year], 75 y.o.   MRN: [id number]  DOS:  03/02/2021 Type of visit - description: CPX  Since the last office visit he is doing okay.  Has no major concerns.   Review of Systems  A 14 point review of systems is negative    Past Medical History:  Diagnosis Date   COPD (chronic obstructive pulmonary disease) ([place])    PFTs 01-2006 mild obstruction, (+) reponse to Bronchodilators     Hyperlipidemia    Hypertension    Shingles    Thorax, 2008   Thyroid disease     Past Surgical History:  Procedure Laterality Date   COLONOSCOPY  11/16/2008   Sharlett Iles   MULTIPLE TOOTH EXTRACTIONS     dentures upper and partial lower   NO PAST SURGERIES     Social History   Socioeconomic History   Marital status: Married    Spouse name: Not on file   Number of children: 3   Years of education: Not on file   Highest education level: Not on file  Occupational History   Occupation: retired 01-2013    Employer: [employer]  Tobacco Use   Smoking status: Former    Types: Cigarettes    Start date: 1960    Quit date: 05/13/2004    Years since quitting: 16.8   Smokeless tobacco: Never   Tobacco comments:    used to smoke 2 ppd   Vaping Use   Vaping Use: Never used  Substance and Sexual Activity   Alcohol use: Yes    Alcohol/week: 7.0 - 14.0 standard drinks    Types: 7 - 14 Cans of beer per week    Comment: 2-3  beers some days   Drug use: No   Sexual activity: Not Currently  Other Topics Concern   Not on file  Social History Narrative   Lives w/ wife   3 children   Daughter lives in [place], Son in [place]   Social Determinants of Health   Financial Resource Strain: Medium Risk   Difficulty of Paying Living Expenses: Somewhat hard  Food Insecurity: Food Insecurity Present   Worried About Charity fundraiser in the Last Year: Sometimes true   Arboriculturist in the Last Year: Sometimes true  Transportation Needs: No Transportation  Needs   Lack of Transportation (Medical): No   Lack of Transportation (Non-Medical): No  Physical Activity: Insufficiently Active   Days of Exercise per Week: 1 day   Minutes of Exercise per Session: 60 min  Stress: No Stress Concern Present   Feeling of Stress : Not at all  Social Connections: Socially Integrated   Frequency of Communication with Friends and Family: More than three times a week   Frequency of Social Gatherings with Friends and Family: Twice a week   Attends Religious Services: More than 4 times per year   Active Member of [employer] or Organizations: Yes   Attends Music therapist: More than 4 times per year   Marital Status: Married  Human resources officer Violence: Not At Risk   Fear of Current or Ex-Partner: No   Emotionally Abused: No   Physically Abused: No   Sexually Abused: No    Allergies as of 03/02/2021       Reactions   Bromide Ion [bromine]    Tiotropium Bromide Monohydrate Hives        Medication List  Accurate as of March 02, 2021 11:59 PM. If you have any questions, ask your nurse or doctor.          STOP taking these medications    amLODipine 5 MG tablet Commonly known as: NORVASC Stopped by: Kathlene November, MD       TAKE these medications    albuterol 108 (90 Base) MCG/ACT inhaler Commonly known as: Ventolin HFA Inhale 2 puffs into the lungs every 6 (six) hours as needed for wheezing or shortness of breath.   aspirin EC 81 MG tablet Take 81 mg by mouth daily. Swallow whole. What changed: Another medication with the same name was removed. Continue taking this medication, and follow the directions you see here. Changed by: Kathlene November, MD   calcium carbonate 600 MG Tabs tablet Commonly known as: OS-CAL Take 600 mg by mouth at bedtime.   fluticasone 44 MCG/ACT inhaler Commonly known as: FLOVENT HFA Inhale 2 puffs into the lungs 2 (two) times daily as needed.   Krill Oil 350 MG Caps Take 1 capsule by mouth daily.    levothyroxine 112 MCG tablet Commonly known as: SYNTHROID Take 1 tablet (112 mcg total) by mouth daily before breakfast. What changed: Another medication with the same name was removed. Continue taking this medication, and follow the directions you see here. Changed by: Kathlene November, MD   metoprolol succinate 50 MG 24 hr tablet Commonly known as: TOPROL-XL Take 1 tablet (50 mg total) by mouth daily.   MULTIVITAMIN PO Take 1 tablet by mouth at bedtime.   simvastatin 40 MG tablet Commonly known as: ZOCOR TAKE 1 TABLET BY MOUTH EVERYDAY AT BEDTIME   Turmeric 500 MG Caps Take 500 mg by mouth daily.   VITAMIN B 12 PO Take 1,000 mcg by mouth daily.   Zinc 50 MG Tabs Take 50 mg by mouth daily.           Objective:   Physical Exam BP (!) 142/80 (BP Location: Left Arm, Patient Position: Sitting, Cuff Size: Small)   Pulse (!) 58   Temp 98.1 F (36.7 C) (Oral)   Resp 16   Ht 5\' 9"  (1.753 m)   Wt 178 lb 6 oz (80.9 kg)   SpO2 96%   BMI 26.34 kg/m  General: Well developed, NAD, BMI noted Neck: No  thyromegaly  HEENT:  Normocephalic . Face symmetric, atraumatic Lungs:  CTA B Normal respiratory effort, no intercostal retractions, no accessory muscle use. Heart: RRR,  no murmur.  Abdomen:  Not distended, soft, non-tender. No rebound or rigidity.   Lower extremities: no pretibial edema bilaterally  Skin: Exposed areas without rash. Not pale. Not jaundice Neurologic:  alert & oriented X3.  Speech normal, gait appropriate for age and unassisted Strength symmetric and appropriate for age.  Psych: Cognition and judgment appear intact.  Cooperative with normal attention span and concentration.  Behavior appropriate. No anxious or depressed appearing.     Assessment    Assessment Hyperglycemia HTN Hypothyroidism Hyperlipidemia LUTS COPD: PFTs 2007 mild obstruction, + response to bronchodilators, h/o multiple inhalers intolerance  Shingles 2008, chest. CAD per CT  2020 (continue aspirin)  PLAN: Here for CPX Hyperglycemia: Check A1c HTN: On the last visit amlodipine was recommended, he took it briefly but he stopped, he does not recall exactly why, side effect?.  Currently on metoprolol, ambulatory BPs solidly in the 120s over 80, occasionally higher or lower numbers.  No change Hypothyroidism: Check TSH Hyperlipidemia: Continue simva, labs COPD: Currently not using  any inhalers, essentially no symptoms specifically no cough or DOE. CAD per CT, continue aspirin. RTC 6 months   This visit occurred during the SARS-CoV-2 public health emergency.  Safety protocols were in place, including screening questions prior to the visit, additional usage of staff PPE, and extensive cleaning of exam room while observing appropriate contact time as indicated for disinfecting solutions.

## 2021-03-02 NOTE — Patient Instructions (Addendum)
Recommend to proceed with new covid booster at your convenience.    Continue checking your blood pressure BP GOAL is between 110/65 and  135/85. If it is consistently higher or lower, let me know     GO TO THE LAB : Get the blood work     [place], [place] back for a checkup in 6 months    Patient elected no further CTs.   (If you already have a living will or healthcare power of attorney, please bring the copy to be scanned in your chart.)  Advance care planning is a process that supports adults in  understanding and sharing their preferences regarding future medical care.   The patient's preferences are recorded in documents called Advance Directives.    Advanced directives are completed (and can be modified at any time) while the patient is in full mental capacity.   The documentation should be available at all times to the patient, the family and the healthcare providers.  Bring in a copy to be scanned in your chart is an excellent idea and is recommended   This legal documents direct treatment decision making and/or appoint a surrogate to make the decision if the patient is not capable to do so.    Advance directives can be documented in many types of formats,  documents have names such as:  Lliving will  Durable power of attorney for healthcare (healthcare proxy or healthcare power of attorney)  Combined directives  Physician orders for life-sustaining treatment    More information at:  [email or web address]

## 2021-03-04 ENCOUNTER — Encounter: Payer: Self-pay | Admitting: Internal Medicine

## 2021-03-04 NOTE — Assessment & Plan Note (Signed)
-  Td:2020 - pneumonia shot 12-08 , 2014;  prevnar 03-2014 -  zostavax :2015; s/p shingrex.   -Had 2 Covid shots x4, booster rec, he is hesitant, benefits discussed. -had flu shot -Cscope x 2  (Dr Inocente Salles) , Cscope 11-2008 negative, colonoscopy 01-2019.  Next per GI.   -Cancer screening: Patient is 40, no FH, no symptoms, last PSA 0.6.  We agreed no further screening. -Labs: CMP, FLP, CBC, TSH -Lung cancer screening: Patient is 11, quit tobacco 2006. Elected no further CTs -Lifestyle discussed -POA discussed

## 2021-03-04 NOTE — Assessment & Plan Note (Signed)
Here for CPX Hyperglycemia: Check A1c HTN: On the last visit amlodipine was recommended, he took it briefly but he stopped, he does not recall exactly why, side effect?.  Currently on metoprolol, ambulatory BPs solidly in the 120s over 80, occasionally higher or lower numbers.  No change Hypothyroidism: Check TSH Hyperlipidemia: Continue simva, labs COPD: Currently not using any inhalers, essentially no symptoms specifically no cough or DOE. CAD per CT, continue aspirin. RTC 6 months

## 2021-03-05 MED ORDER — LEVOTHYROXINE SODIUM 88 MCG PO TABS: 88.0000 ug | ORAL_TABLET | Freq: Every day | ORAL | 1 refills | Status: DC

## 2021-03-05 NOTE — Addendum Note (Signed)
Addended byDamita Dunnings D on: 03/05/2021 09:24 AM   Modules accepted: Orders

## 2021-04-16 ENCOUNTER — Other Ambulatory Visit (INDEPENDENT_AMBULATORY_CARE_PROVIDER_SITE_OTHER): Payer: PPO

## 2021-04-16 DIAGNOSIS — E039 Hypothyroidism, unspecified: Secondary | ICD-10-CM

## 2021-04-16 LAB — TSH: TSH: 0.97 u[IU]/mL (ref 0.35–5.50)

## 2021-04-17 MED ORDER — LEVOTHYROXINE SODIUM 88 MCG PO TABS: 88.0000 ug | ORAL_TABLET | Freq: Every day | ORAL | 1 refills | Status: DC

## 2021-04-17 NOTE — Addendum Note (Signed)
Addended byDamita Dunnings D on: 04/17/2021 04:23 PM   Modules accepted: Orders

## 2021-04-18 ENCOUNTER — Ambulatory Visit: Payer: PPO | Admitting: Pharmacist

## 2021-04-18 DIAGNOSIS — E782 Mixed hyperlipidemia: Secondary | ICD-10-CM

## 2021-04-18 DIAGNOSIS — J410 Simple chronic bronchitis: Secondary | ICD-10-CM

## 2021-04-18 DIAGNOSIS — I10 Essential (primary) hypertension: Secondary | ICD-10-CM

## 2021-04-18 DIAGNOSIS — E039 Hypothyroidism, unspecified: Secondary | ICD-10-CM

## 2021-04-18 NOTE — Patient Instructions (Signed)
Kevin [name] It was a pleasure speaking with you today.  I have attached a summary of our visit today and information about your health goals.   Our next appointment is by telephone on October 17, 2021 at 8:30am  Please call the care guide team at [phone] if you need to cancel or reschedule your appointment.     If you have any questions or concerns, please feel free to contact me either at the phone number below or with a MyChart message.   Keep up the good work!  Cherre Robins, PharmD Clinical Pharmacist [facility] [phone] (direct line)  [phone] (main office number)  CARE PLAN ENTRY  Current Barriers:  Chronic Disease Management support, education, and care coordination needs related to COPD, Pre-DM, HTN, HLD, Hypothyroidism, Edema Unable to maintain control of hypertension  Pharmacist Clinical Goal(s):  A1c goal <6.5% LDL goal <100 Blood pressure goal <140/90  Pharmacist Clinical Goal(s):  Over the next 180 days, patient will achieve adherence to monitoring guidelines and medication adherence to achieve therapeutic efficacy maintain control of HTN as evidenced by BP <140/90  adhere to prescribed medication regimen as evidenced by fill history contact provider office for questions/concerns as evidenced notation of same in electronic health record through collaboration with PharmD and provider.   Interventions: 1:1 collaboration with Colon Branch, MD regarding development and update of comprehensive plan of care as evidenced by provider attestation and co-signature Inter-disciplinary care team collaboration (see longitudinal plan of care) Comprehensive medication review performed; medication list updated in electronic medical record  Hypertension: BP Readings from Last 3 Encounters:  03/02/21 (!) 142/80  10/12/20 (!) 141/74  08/31/20 (!) 146/84   Blood pressure goal <140/90 (home blood pressure reading have been at  goal) Current treatment: Metoprolol succinate ER 50mg  daily  Interventions:  Continue to check blood pressure 2 to 3 times per week Discussed blood pressure goal Continue to limit intake of sodium to <2300 mg per day  Hyperlipidemia: Lab Results  Component Value Date   CHOL 141 03/02/2021   CHOL 144 03/01/2020   CHOL 149 12/18/2018   Lab Results  Component Value Date   HDL 47.30 03/02/2021   HDL 49 03/01/2020   HDL 53.50 12/18/2018   Lab Results  Component Value Date   LDLCALC 63 03/02/2021   LDLCALC 69 03/01/2020   LDLCALC 73 12/18/2018   Lab Results  Component Value Date   TRIG 153.0 (H) 03/02/2021   TRIG 183 (H) 03/01/2020   TRIG 115.0 12/18/2018   LDL goal <100 Current treatment: Simvastatin 40mg  daily  Interventions:  Discussed last lipid panel and LDL goal Continue current regimen for lipids  Chronic Obstructive Pulmonary Disease:  Current treatment: Prescribed Flovent HFA 95mcg and albuterol HFA but patient reports he has not used either in over a year  Interventions:  Recommend consider follow up with pulmonologist to evaluate lung function  Keep albuterol inhaler on hand for rescue / emergency  Medication Management:  Uses [facility] pharmacy Reviewed medications and updated medication list to add vitamins and herbal medications patient has recently started - turmeric, Krill oil and zinc.  Discussed adherence  Patient Goals/Self-Care Activities Over the next 180 days, patient will:  take medications as prescribed,  check blood pressure 2 to 3 times per week, document, and provide at future appointments, and  target a minimum of 150 minutes of moderate intensity exercise weekly The patient verbalized understanding of instructions, educational materials, and care plan provided today  and declined offer to receive copy of patient instructions, educational materials, and care plan.

## 2021-04-18 NOTE — Chronic Care Management (AMB) (Signed)
Chronic Care Management Pharmacy Note  04/18/2021 Name:  Kevin [name] MRN:  [id number] DOB:  Jul 05, 1945  Summary: Hypertension: Office blood pressure elevated but Home BP has ranged from SBP 120-135 and DBP 80-90 COPD: has not used rescue inhaler in over 1 year Hyperlipidemia: LDL at goal  Hypothyroidism: last TSH in therapeutic range.  Recommendations/Changes made from today's visit: No med changes.   Plan: In future if additional BP lowering needed, consider diuretic like hydrochlorothiazide or spironolactone since patient also has had edema in past.  Follow up by CMA to check home blood pressure in 3 months; clinical pharmacist f/u in 6 months   Subjective: Kevin [name] is an 75 y.o. year old male who is a primary patient of Paz, Alda Berthold, MD.  The CCM team was consulted for assistance with disease management and care coordination needs.    Engaged with patient by telephone for follow up visit in response to provider referral for pharmacy case management and/or care coordination services.   Consent to Services:  The patient was given information about Chronic Care Management services, agreed to services, and gave verbal consent prior to initiation of services.  Please see initial visit note for detailed documentation.   Patient Care Team: Colon Branch, MD as PCP - General Danis, Kirke Corin, MD as Consulting Physician (Gastroenterology) Renda Rolls, Jennefer Bravo, MD as Referring Physician (Dermatology) Cherre Robins, [place] (Pharmacist)  Recent office visits: 03/02/2021 - Int Med (Dr Larose Kells) Annual Physical. Removed amlodipine from medication list (patient not taking). Recommended he restart aspirin 34m dialy due to CAD on CT. Labs checked. TSH still low - lowered dose of levothyroxine to 87m daily  01/23/2021 - [facility] - mailed info on resources for energy assistance, food pantries and food stamps. Letter saved in Epic.  01/22/2021 - Int Med - Phone. Question  about dose of levothyroxine. Dose deceaed after labs Current dose if levothyroxine 11256m daily (TSH was low / over supplementation of thyroid hormone) 10/12/2020 - PCP (nurse visit) BP was 141/74. No notation of not taking amlodipine but phone call in May noted patient has stopped amlodipine due to edema.   Recent consult visits: 01/24/2021 - Dermatology - Dr McCRozann Lescheseen for melancytic nevi of trunk, actinic keratosis, inflamed seborrheic keratosis Last visit with pulmonologist - Dr OlaAnder Slade/2020  Hospital visits: None in previous 6 months  Objective:  Lab Results  Component Value Date   CREATININE 0.91 03/02/2021   CREATININE 1.01 03/01/2020   CREATININE 1.05 12/18/2018    Lab Results  Component Value Date   HGBA1C 5.8 03/02/2021   Last diabetic Eye exam: No results found for: HMDIABEYEEXA  Last diabetic Foot exam: No results found for: HMDIABFOOTEX      Component Value Date/Time   CHOL 141 03/02/2021 1044   TRIG 153.0 (H) 03/02/2021 1044   HDL 47.30 03/02/2021 1044   CHOLHDL 3 03/02/2021 1044   VLDL 30.6 03/02/2021 1044   LDLCALC 63 03/02/2021 1044   LDLCALC 69 03/01/2020 1502   LDLDIRECT 162.2 08/01/2008 1513    Hepatic Function Latest Ref Rng & Units 03/02/2021 03/01/2020 12/18/2018  Total Protein 6.0 - 8.3 g/dL 7.1 7.0 7.1  Albumin 3.5 - 5.2 g/dL 4.3 - 4.4  AST 0 - 37 U/L '22 18 18  ' ALT 0 - 53 U/L '22 17 18  ' Alk Phosphatase 39 - 117 U/L 60 - 52  Total Bilirubin 0.2 - 1.2 mg/dL 0.5 0.4 0.5  Bilirubin, Direct 0.0 -  0.3 mg/dL - - -    Lab Results  Component Value Date/Time   TSH 0.97 04/16/2021 09:51 AM   TSH 0.06 (L) 03/02/2021 10:44 AM   FREET4 0.79 12/10/2016 09:47 AM   FREET4 0.61 03/11/2016 08:59 AM    CBC Latest Ref Rng & Units 03/02/2021 03/01/2020 12/18/2018  WBC 4.0 - 10.5 K/uL 4.0 4.7 4.3  Hemoglobin 13.0 - 17.0 g/dL 14.2 14.5 14.8  Hematocrit 39.0 - 52.0 % 42.5 42.6 44.1  Platelets 150.0 - 400.0 K/uL 249.0 231 269.0    No results found for:  VD25OH  Clinical ASCVD: No  The 10-year ASCVD risk score (Arnett DK, et al., 2019) is: 34.6%   Values used to calculate the score:     Age: 75 years     Sex: Male     Is Non-Hispanic African American: No     Diabetic: No     Tobacco smoker: No     Systolic Blood Pressure: 585 mmHg     Is BP treated: Yes     HDL Cholesterol: 47.3 mg/dL     Total Cholesterol: 141 mg/dL     Social History   Tobacco Use  Smoking Status Former   Types: Cigarettes   Start date: 1960   Quit date: 05/13/2004   Years since quitting: 16.9  Smokeless Tobacco Never  Tobacco Comments   used to smoke 2 ppd    BP Readings from Last 3 Encounters:  03/02/21 (!) 142/80  10/12/20 (!) 141/74  08/31/20 (!) 146/84   Pulse Readings from Last 3 Encounters:  03/02/21 (!) 58  10/12/20 67  08/31/20 68   Wt Readings from Last 3 Encounters:  03/02/21 178 lb 6 oz (80.9 kg)  01/27/21 180 lb (81.6 kg)  08/31/20 181 lb 8 oz (82.3 kg)    Assessment: Review of patient past medical history, allergies, medications, health status, including review of consultants reports, laboratory and other test data, was performed as part of comprehensive evaluation and provision of chronic care management services.   SDOH:  (Social Determinants of Health) assessments and interventions performed:  SDOH Interventions    Flowsheet Row Most Recent Value  SDOH Interventions   Financial Strain Interventions Other (Comment)  [utilizing food bank when needed,  They have had referral for assistance with utilities. Getting assistance form [employer] for wifes meds which has help a lot with finances]       CCM Care Plan  Allergies  Allergen Reactions   Bromide Ion [Bromine]    Tiotropium Bromide Monohydrate Hives    Medications Reviewed Today     Reviewed by Cherre Robins, RPH-CPP (Pharmacist) on 04/18/21 at [phone]  Med List Status: <None>   Medication Order Taking? Sig Documenting Provider Last Dose Status Informant  albuterol (VENTOLIN  HFA) 108 (90 Base) MCG/ACT inhaler [id number] No Inhale 2 puffs into the lungs every 6 (six) hours as needed for wheezing or shortness of breath.  Patient not taking: Reported on 10/17/2020   Colon Branch, MD Not Taking Active   aspirin EC 81 MG tablet [id number] Yes Take 81 mg by mouth daily. Swallow whole. [provider] Taking Active   calcium carbonate (OS-CAL) 600 MG TABS tablet [id number] Yes Take 600 mg by mouth at bedtime.  [provider] Taking Active   Cyanocobalamin (VITAMIN B 12 PO) [id number] Yes Take 1,000 mcg by mouth 3 (three) times a week. [provider] Taking Active   Krill Oil [place] [id number] Yes Take 1  capsule by mouth daily. [provider] Taking Active   levothyroxine (SYNTHROID) 88 MCG tablet [id number] Yes Take 1 tablet (88 mcg total) by mouth daily before breakfast. Colon Branch, MD Taking Active   metoprolol succinate (TOPROL-XL) 50 MG 24 hr tablet [id number] Yes Take 1 tablet (50 mg total) by mouth daily. Colon Branch, MD Taking Active   Multiple Vitamins-Minerals (MULTIVITAMIN PO) [id number] Yes Take 1 tablet by mouth daily. In the evening [provider] Taking Active   simvastatin (ZOCOR) 40 MG tablet [id number] Yes TAKE 1 TABLET BY MOUTH EVERYDAY AT BEDTIME Colon Branch, MD Taking Active   Turmeric 500 MG CAPS [id number] Yes Take 500 mg by mouth daily. [provider] Taking Active   Zinc 50 MG TABS [id number] Yes Take 50 mg by mouth daily. [provider] Taking Active             Patient Active Problem List   Diagnosis Date Noted   Subclinical hypothyroidism 12/10/2016   PCP NOTES >>>>>>>>>>>>>>>>> 12/07/2015   Lower urinary tract symptoms (LUTS) 10/11/2014   Hyperglycemia 03/23/2014   Edema 10/20/2013   Skin lesion 10/20/2013   Annual physical exam 01/24/2011   Hyperlipidemia 03/23/2008   Hypertension 04/15/2007   COPD (chronic obstructive pulmonary disease) ([place]) 04/15/2007    Immunization  History  Administered Date(s) Administered   Fluad Quad(high Dose 65+) 01/15/2019   Influenza Split 05/22/2012   Influenza, High Dose Seasonal PF 02/10/2013, 03/11/2016, 01/29/2020, 01/22/2021   Influenza,inj,Quad PF,6+ Mos 02/21/2014   Influenza-Unspecified 02/11/2017, 02/10/2018   PFIZER Comirnaty(Gray Top)Covid-19 Tri-Sucrose Vaccine 09/12/2020   PFIZER(Purple Top)SARS-COV-2 Vaccination 06/03/2019, 06/24/2019, 04/11/2020   Pneumococcal Conjugate-13 03/23/2014   Pneumococcal Polysaccharide-23 04/15/2007, 04/20/2013   Td 05/13/1998, 08/01/2008   Tdap 05/13/2019   Zoster Recombinat (Shingrix) 01/15/2019, 04/16/2019   Zoster, Live 09/14/2013    Conditions to be addressed/monitored: HTN, HLD, COPD and pre DM; hypothyroidism; joint pain  Care Plan : General Pharmacy (Adult)  Updates made by Cherre Robins, RPH-CPP since 04/18/2021 12:00 AM     Problem: CHL AMB "PATIENT-SPECIFIC PROBLEM"      Long-Range Goal: Medication and Chronic Care Management   Start Date: 10/17/2020  This Visit's Progress: On track  Recent Progress: Not on track  Priority: High  Note:   Current Barriers:  Unable to maintain control of hypertension Does not adhere to prescribed medication regimen Does not contact provider office for questions/concerns  Pharmacist Clinical Goal(s):  Over the next 180 days, patient will achieve adherence to monitoring guidelines and medication adherence to achieve therapeutic efficacy maintain control of HTN as evidenced by BP <140/90  adhere to prescribed medication regimen as evidenced by fill history contact provider office for questions/concerns as evidenced notation of same in electronic health record through collaboration with PharmD and provider.   Interventions: 1:1 collaboration with Colon Branch, MD regarding development and update of comprehensive plan of care as evidenced by provider attestation and co-signature Inter-disciplinary care team collaboration (see  longitudinal plan of care) Comprehensive medication review performed; medication list updated in electronic medical record  Hypertension: Controlled; blood pressure goal <140/90 Current treatment: Metoprolol succinate ER 30m daily  Previous  medications tried: amlodipine 511m- stopped due to increased LEE Current home readings: SBP 120-135 and DBP 80 to 90 Denies hypotensive/hypertensive symptoms Reports to today that LEE improved; only occurs occasionally Interventions:  Patient to continue to check blood pressure 2 to 3 times per week Discussed blood pressure goal In future if BP still above  goal could add diuretic like HCTZ or spironolactone since he had edema from time to time  Hyperlipidemia: Controlled; LDL goal <100 Current treatment: Simvastatin 29m daily  [facility] Interventions:  Discussed last lipid panel and LDL goal Continue current regimen for lipids Increase exercise to goal of 150 minutes per week.   Chronic Obstructive Pulmonary Disease: Controlled; patient reports no need for rescue inhaler in over a year and not SOB or wheezing.  Current treatment: Prescribed Flovent HFA 454m and albuterol HFA but patient reports he has not used either in over a year and does not feel that he needs them COPD class A No history of PFTs;  Last visit with pulmonologist, Dr OlAnder Sladeas 02/2019  One episode of acute bronchitis 03/19/2019.   Interventions:  Recommend he consider follow up with pulmonologist to evaluate lung function  Keep albuterol inhaler on hand for rescue Updated medication list and removed Flovent HFA  Medication Management:  [facility] Reviewed medications and updated medication list Reviewed refill history and discussed adherence  SODH:  Patient has had phone visit with [facility] discussed assitance with utilities and food. Per patient today he decided not to complete application for  assistance for utilities at this time. Advised patient is he changes his mind he can contact office for referral back to Care Coordination.  Using food bank when needed for food.  Patient reports his wife has qualified for patient assistance program with [employer] will receive [place] from patient assistance program until 05/12/2021. This has helped financially.   Patient Goals/Self-Care Activities Over the next 180 days, patient will:  take medications as prescribed,  check blood pressure 2 to 3 times per week, document, and provide at future appointments, and  target a minimum of 150 minutes of moderate intensity exercise weekly  Follow Up Plan: The care management team will reach out to the patient again over the next 90 to 120 days.        Medication Assistance: None required.  Patient affirms current coverage meets needs.  [id number] [place]one: [phone]: [phone] Up:  Patient agrees to Care Plan and Follow-up.  Plan: The care management team will reach out to the patient again over the next 90 days. To check blood pressure at home; F/U with clinical pharmacist in 6 months.  TamCherre RobinsharmD Clinical Pharmacist [phone]

## 2021-04-20 ENCOUNTER — Other Ambulatory Visit: Payer: Self-pay | Admitting: Internal Medicine

## 2021-04-30 ENCOUNTER — Other Ambulatory Visit: Payer: Self-pay | Admitting: Internal Medicine

## 2021-06-20 ENCOUNTER — Telehealth: Payer: Self-pay | Admitting: Pharmacist

## 2021-06-20 NOTE — Chronic Care Management (AMB) (Signed)
° ° °  Chronic Care Management Pharmacy Assistant   Name: Kevin [name]  MRN: [id number] DOB: 1946/04/13  Reason for Encounter: Disease State Hypertension   Recent office visits:  None noted  Recent consult visits:  None noted  Hospital visits:  None in previous 6 months  Medications: Outpatient Encounter Medications as of 06/20/2021  Medication Sig   metoprolol succinate (TOPROL-XL) 50 MG 24 hr tablet TAKE 1 TABLET BY MOUTH EVERY DAY   albuterol (VENTOLIN HFA) 108 (90 Base) MCG/ACT inhaler Inhale 2 puffs into the lungs every 6 (six) hours as needed for wheezing or shortness of breath. (Patient not taking: Reported on 10/17/2020)   aspirin EC 81 MG tablet Take 81 mg by mouth daily. Swallow whole.   calcium carbonate (OS-CAL) 600 MG TABS tablet Take 600 mg by mouth at bedtime.    Cyanocobalamin (VITAMIN B 12 PO) Take 1,000 mcg by mouth 3 (three) times a week.   Krill Oil 350 MG CAPS Take 1 capsule by mouth daily.   levothyroxine (SYNTHROID) 88 MCG tablet Take 1 tablet (88 mcg total) by mouth daily before breakfast.   Multiple Vitamins-Minerals (MULTIVITAMIN PO) Take 1 tablet by mouth daily. In the evening   simvastatin (ZOCOR) 40 MG tablet TAKE 1 TABLET BY MOUTH EVERYDAY AT BEDTIME   Turmeric 500 MG CAPS Take 500 mg by mouth daily.   Zinc 50 MG TABS Take 50 mg by mouth daily.   No facility-administered encounter medications on file as of 06/20/2021.   Current antihypertensive regimen:  Metoprolol succinate ER 50mg  daily  How often are you checking your Blood Pressure? 3-5x per week  Current home BP readings:        Most recent reading:128/85  What recent interventions/DTPs have been made by any provider to improve Blood Pressure control since last CPP Visit: None noted  Any recent hospitalizations or ED visits since last visit with CPP? No  What diet changes have been made to improve Blood Pressure Control?  Patient states he has cut back on his salt intake  What exercise is  being done to improve your Blood Pressure Control?  Patient states he does exercise he walks 1 mile and has done some yard work.   Adherence Review: Is the patient currently on ACE/ARB medication? No Does the patient have >5 day gap between last estimated fill dates? No   Star Rating Drugs: Simvastatin 40 mg Last filled:04/23/21 90 DS  [name], [place]

## 2021-07-20 ENCOUNTER — Other Ambulatory Visit: Payer: Self-pay | Admitting: Internal Medicine

## 2021-08-27 ENCOUNTER — Other Ambulatory Visit (HOSPITAL_BASED_OUTPATIENT_CLINIC_OR_DEPARTMENT_OTHER): Payer: Self-pay

## 2021-08-27 ENCOUNTER — Ambulatory Visit (INDEPENDENT_AMBULATORY_CARE_PROVIDER_SITE_OTHER): Payer: PPO | Admitting: Medical

## 2021-08-27 ENCOUNTER — Ambulatory Visit (HOSPITAL_BASED_OUTPATIENT_CLINIC_OR_DEPARTMENT_OTHER)
Admission: RE | Admit: 2021-08-27 | Discharge: 2021-08-27 | Disposition: A | Discharge status: Home / Self Care | Payer: PPO | Source: Ambulatory Visit | Attending: Medical | Admitting: Medical

## 2021-08-27 ENCOUNTER — Encounter: Payer: Self-pay | Admitting: Medical

## 2021-08-27 VITALS — BP 130/70 | HR 63 | Temp 97.6°F | Resp 16 | Ht 71.0 in | Wt 172.6 lb

## 2021-08-27 DIAGNOSIS — M542 Cervicalgia: Secondary | ICD-10-CM

## 2021-08-27 DIAGNOSIS — M47812 Spondylosis without myelopathy or radiculopathy, cervical region: Secondary | ICD-10-CM | POA: Diagnosis not present

## 2021-08-27 MED ORDER — METHYLPREDNISOLONE 4 MG PO TBPK
ORAL_TABLET | ORAL | 0 refills | Status: DC
  Filled 2021-08-27: qty 21, 6d supply, fill #0

## 2021-08-27 MED ORDER — CYCLOBENZAPRINE HCL 10 MG PO TABS
10.0000 mg | ORAL_TABLET | Freq: Every day | ORAL | 0 refills | Status: DC
  Filled 2021-08-27: qty 4, 4d supply, fill #0

## 2021-08-27 NOTE — Progress Notes (Signed)
? ?Subjective:  ? ? Patient ID: Kevin [name], male    DOB: Aug 19, 1945, 76 y.o.   MRN: [id number] ? ?HPI ? ?Pt states wed last week he was helping daughter at [facility] and he tried to catch 80 lb of concrete from falling off cart. He felt twinge in neck and rt trap at that time but next day woke up Thursday moring and neck pain was more and neck was very stiff. Since then state pain is worse. No fever, no chills, ha or st. ? ?Since past Thursday one sharp transient event rt side radicualry pain to rt upper ext. ? ?Pt has been taking tyelnol and applied icey hot.  ? ? ? ?Review of Systems  ?Constitutional:  Negative for chills, fatigue and fever.  ?Respiratory:  Negative for cough, chest tightness, shortness of breath and wheezing.   ?Cardiovascular:  Negative for chest pain and palpitations.  ?Musculoskeletal:  Positive for neck pain.  ?Skin:  Negative for rash.  ?Psychiatric/Behavioral:  Negative for behavioral problems and decreased concentration.   ? ? ?Past Medical History:  ?Diagnosis Date  ? COPD (chronic obstructive pulmonary disease) ([place])   ? PFTs 01-2006 mild obstruction, (+) reponse to Bronchodilators    ? Hyperlipidemia   ? Hypertension   ? Shingles   ? Thorax, 2008  ? Thyroid disease   ? ?  ?Social History  ? ?Socioeconomic History  ? Marital status: Married  ?  Spouse name: Not on file  ? Number of children: 3  ? Years of education: Not on file  ? Highest education level: Not on file  ?Occupational History  ? Occupation: retired 01-2013  ?  Employer: [employer]  ?Tobacco Use  ? Smoking status: Former  ?  Types: Cigarettes  ?  Start date: 56  ?  Quit date: 05/13/2004  ?  Years since quitting: 17.3  ? Smokeless tobacco: Never  ? Tobacco comments:  ?  used to smoke 2 ppd   ?Vaping Use  ? Vaping Use: Never used  ?Substance and Sexual Activity  ? Alcohol use: Yes  ?  Alcohol/week: 7.0 - 14.0 standard drinks  ?  Types: 7 - 14 Cans of beer per week  ?  Comment: 2-3  beers some days  ? Drug use: No  ? Sexual activity:  Not Currently  ?Other Topics Concern  ? Not on file  ?Social History Narrative  ? Lives w/ wife  ? 3 children  ? Daughter lives in [place], Son in [place]  ? ?Social Determinants of Health  ? ?Financial Resource Strain: Medium Risk  ? Difficulty of Paying Living Expenses: Somewhat hard  ?Food Insecurity: Food Insecurity Present  ? Worried About Charity fundraiser in the Last Year: Sometimes true  ? Ran Out of Food in the Last Year: Sometimes true  ?Transportation Needs: No Transportation Needs  ? Lack of Transportation (Medical): No  ? Lack of Transportation (Non-Medical): No  ?Physical Activity: Insufficiently Active  ? Days of Exercise per Week: 1 day  ? Minutes of Exercise per Session: 60 min  ?Stress: No Stress Concern Present  ? Feeling of Stress : Not at all  ?Social Connections: Socially Integrated  ? Frequency of Communication with Friends and Family: More than three times a week  ? Frequency of Social Gatherings with Friends and Family: Twice a week  ? Attends Religious Services: More than 4 times per year  ? Active Member of Clubs or Organizations: Yes  ? Attends Club or  Organization Meetings: More than 4 times per year  ? Marital Status: Married  ?Intimate Partner Violence: Not At Risk  ? Fear of Current or Ex-Partner: No  ? Emotionally Abused: No  ? Physically Abused: No  ? Sexually Abused: No  ? ? ?Past Surgical History:  ?Procedure Laterality Date  ? COLONOSCOPY  11/16/2008  ? Sharlett Iles  ? MULTIPLE TOOTH EXTRACTIONS    ? dentures upper and partial lower  ? NO PAST SURGERIES    ? ? ?Family History  ?Problem Relation Age of Onset  ? CAD Father   ?     F MI ~ 79 y/o  ? Hypertension Mother   ?     M and S  ? Colon cancer Neg Hx   ? Prostate cancer Neg Hx   ? Diabetes Neg Hx   ? Rectal cancer Neg Hx   ? Stomach cancer Neg Hx   ? ? ?Allergies  ?Allergen Reactions  ? Bromide Ion [Bromine]   ? Tiotropium Bromide Monohydrate Hives  ? ? ?Current Outpatient Medications on File Prior to Visit  ?Medication Sig Dispense  Refill  ? albuterol (VENTOLIN HFA) 108 (90 Base) MCG/ACT inhaler Inhale 2 puffs into the lungs every 6 (six) hours as needed for wheezing or shortness of breath. 18 g 5  ? aspirin EC 81 MG tablet Take 81 mg by mouth daily. Swallow whole.    ? calcium carbonate (OS-CAL) 600 MG TABS tablet Take 600 mg by mouth at bedtime.     ? Cyanocobalamin (VITAMIN B 12 PO) Take 1,000 mcg by mouth 3 (three) times a week.    ? Krill Oil 350 MG CAPS Take 1 capsule by mouth daily.    ? levothyroxine (SYNTHROID) 88 MCG tablet Take 1 tablet (88 mcg total) by mouth daily before breakfast. 90 tablet 1  ? metoprolol succinate (TOPROL-XL) 50 MG 24 hr tablet TAKE 1 TABLET BY MOUTH EVERY DAY 90 tablet 1  ? Multiple Vitamins-Minerals (MULTIVITAMIN PO) Take 1 tablet by mouth daily. In the evening    ? simvastatin (ZOCOR) 40 MG tablet TAKE 1 TABLET BY MOUTH EVERYDAY AT BEDTIME 90 tablet 1  ? Turmeric 500 MG CAPS Take 500 mg by mouth daily.    ? Zinc 50 MG TABS Take 50 mg by mouth daily.    ? ?No current facility-administered medications on file prior to visit.  ? ? ?BP 130/70   Pulse 63   Temp 97.6 ?F (36.4 ?C) (Oral)   Resp 16   Ht '5\' 11"'$  (1.803 m)   Wt 172 lb 9.6 oz (78.3 kg)   SpO2 93%   BMI 24.07 kg/m?  ?  ?   ?Objective:  ? Physical Exam ? ?General- No acute distress. Pleasant patient. ?Neck- mild mid c spine tenderness. Rt trapezius very tender to palpation. Diffiuclty turning head due to neck stiffness ?Lungs- Clear, even and unlabored. ?Heart- regular rate and rhythm. ?Neurologic- CNII- XII grossly intact.  ? ? ?   ?Assessment & Plan:  ? ?Patient Instructions  ?Neck pain since past all Wednesday.  On exam some mild mid cervical spine tenderness to palpation.  Your right trapezius does feel very tight and is tender.  We will get cervical spine x-ray today.  Prescribing Flexeril 10 mg tabs to use only at night.  Caution on sedation side effects and potential dizziness.  Decided to go ahead and prescribe a 6-day taper dose of  Medrol. ? ?If pain persisting despite above measures consider referral  to sports medicine. ? ?Follow-up date to be determined after x-ray review and after update via MyChart or calling by Thursday morning.  ?

## 2021-08-27 NOTE — Patient Instructions (Signed)
Neck pain since past all Wednesday.  On exam some mild mid cervical spine tenderness to palpation.  Your right trapezius does feel very tight and is tender.  We will get cervical spine x-ray today.  Prescribing Flexeril 10 mg tabs to use only at night.  Caution on sedation side effects and potential dizziness.  Decided to go ahead and prescribe a 6-day taper dose of Medrol. ? ?If pain persisting despite above measures consider referral to sports medicine. ? ?Follow-up date to be determined after x-ray review and after update via MyChart or calling by Thursday morning. ?

## 2021-08-31 ENCOUNTER — Telehealth: Payer: Self-pay | Admitting: Internal Medicine

## 2021-08-31 NOTE — Telephone Encounter (Signed)
Pt called to advise [place] that the treatment Kevin [name] prescribed him is working well and that he would like to let Kevin [name] know. ?

## 2021-09-03 ENCOUNTER — Ambulatory Visit: Payer: PPO | Admitting: Physician Assistant

## 2021-09-03 ENCOUNTER — Ambulatory Visit: Payer: PPO | Admitting: Internal Medicine

## 2021-09-11 ENCOUNTER — Ambulatory Visit (INDEPENDENT_AMBULATORY_CARE_PROVIDER_SITE_OTHER): Payer: PPO | Admitting: Internal Medicine

## 2021-09-11 ENCOUNTER — Encounter: Payer: Self-pay | Admitting: Internal Medicine

## 2021-09-11 VITALS — BP 124/68 | HR 48 | Temp 98.0°F | Resp 16 | Ht 71.0 in | Wt 173.2 lb

## 2021-09-11 DIAGNOSIS — I10 Essential (primary) hypertension: Secondary | ICD-10-CM | POA: Diagnosis not present

## 2021-09-11 DIAGNOSIS — E039 Hypothyroidism, unspecified: Secondary | ICD-10-CM | POA: Diagnosis not present

## 2021-09-11 LAB — BASIC METABOLIC PANEL
BUN: 15 mg/dL (ref 6–23)
CO2: 31 mEq/L (ref 19–32)
Calcium: 9.2 mg/dL (ref 8.4–10.5)
Chloride: 99 mEq/L (ref 96–112)
Creatinine, Ser: 1.2 mg/dL (ref 0.40–1.50)
GFR: 58.17 mL/min — ABNORMAL LOW (ref >60.00)
Glucose, Bld: 85 mg/dL (ref 70–99)
Potassium: 4.6 mEq/L (ref 3.5–5.1)
Sodium: 137 mEq/L (ref 135–145)

## 2021-09-11 LAB — TSH: TSH: 1.24 u[IU]/mL (ref 0.35–5.50)

## 2021-09-11 NOTE — Assessment & Plan Note (Signed)
HTN: Ambulatory BPs 130/70, on metoprolol, heart rate is slightly lower but he is asymptomatic.  Last potassium slightly high, check a BMP ?Hypothyroidism: On Synthroid, check TSH, further advised with results ?Neck pain: Saw one of my partners few days ago with acute neck pain after doing heavy lifting, x-ray shows severe DJD, got prednisone, back to normal. ?Preventive care: COVID-vaccine is an option ?RTC 03-2022 CPE ?  ?

## 2021-09-11 NOTE — Progress Notes (Signed)
? ?Subjective:  ? ? Patient ID: Kevin [name], male    DOB: September 10, 1945, 76 y.o.   MRN: [id number] ? ?DOS:  09/11/2021 ?Type of visit - description: f/u ? ?Since the last office visit is doing well. ?Did have neck pain, recent x-ray shows severe DJD, took prednisone and is now back to normal. ? ?Ambulatory BPs are normal. ?Denies fevers, some runny nose related to allergies with minimal cough or wheezing  ? ?Review of Systems ?See above  ? ?Past Medical History:  ?Diagnosis Date  ? COPD (chronic obstructive pulmonary disease) ([place])   ? PFTs 01-2006 mild obstruction, (+) reponse to Bronchodilators    ? Hyperlipidemia   ? Hypertension   ? Shingles   ? Thorax, 2008  ? Thyroid disease   ? ? ?Past Surgical History:  ?Procedure Laterality Date  ? COLONOSCOPY  11/16/2008  ? Sharlett Iles  ? MULTIPLE TOOTH EXTRACTIONS    ? dentures upper and partial lower  ? NO PAST SURGERIES    ? ? ?Current Outpatient Medications  ?Medication Instructions  ? albuterol (VENTOLIN HFA) 108 (90 Base) MCG/ACT inhaler 2 puffs, Inhalation, Every 6 hours PRN  ? aspirin EC 81 mg, Oral, Daily, Swallow whole.  ? calcium carbonate (OS-CAL) 600 mg, Oral, Daily at bedtime  ? Cyanocobalamin (VITAMIN B 12 PO) 1,000 mcg, Oral, 3 times weekly  ? cyclobenzaprine (FLEXERIL) 10 mg, Oral, Daily at bedtime  ? Krill Oil 350 MG CAPS 1 capsule, Oral, Daily  ? levothyroxine (SYNTHROID) 88 mcg, Oral, Daily before breakfast  ? methylPREDNISolone (MEDROL DOSEPAK) 4 MG TBPK tablet Take as directed on package  ? metoprolol succinate (TOPROL-XL) 50 MG 24 hr tablet TAKE 1 TABLET BY MOUTH EVERY DAY  ? Multiple Vitamins-Minerals (MULTIVITAMIN PO) 1 tablet, Oral, Daily, In the evening  ? simvastatin (ZOCOR) 40 MG tablet TAKE 1 TABLET BY MOUTH EVERYDAY AT BEDTIME  ? Turmeric 500 mg, Oral, Daily  ? Zinc 50 mg, Oral, Daily  ? ? ?   ?Objective:  ? Physical Exam ?BP 124/68 (BP Location: Left Arm, Patient Position: Sitting, Cuff Size: Small)   Pulse (!) 48   Temp 98 ?F (36.7 ?C) (Oral)    Resp 16   Ht '5\' 11"'$  (1.803 m)   Wt 173 lb 4 oz (78.6 kg)   SpO2 96%   BMI 24.16 kg/m?  ?General:   ?Well developed, NAD, BMI noted.  ?HEENT:  ?Normocephalic . Face symmetric, atraumatic ?Lungs:  ?CTA B ?Normal respiratory effort, no intercostal retractions, no accessory muscle use. ?Heart: RRR,  no murmur.  ?Abdomen:  ?Not distended, soft, non-tender. No rebound or rigidity.  No bruit ?Skin: Not pale. Not jaundice ?Lower extremities: no pretibial edema bilaterally  ?Neurologic:  ?alert & oriented X3.  ?Speech normal, gait appropriate for age and unassisted ?Psych--  ?Cognition and judgment appear intact.  ?Cooperative with normal attention span and concentration.  ?Behavior appropriate. ?No anxious or depressed appearing. ? ?   ?Assessment   ? ? Assessment ?Hyperglycemia ?HTN ?Hypothyroidism ?Hyperlipidemia ?LUTS ?COPD: PFTs 2007 mild obstruction, + response to bronchodilators, h/o multiple inhalers intolerance  ?Shingles 2008, chest. ?CAD per CT 2020 (continue aspirin) ? ?PLAN: ?HTN: Ambulatory BPs 130/70, on metoprolol, heart rate is slightly lower but he is asymptomatic.  Last potassium slightly high, check a BMP ?Hypothyroidism: On Synthroid, check TSH, further advised with results ?Neck pain: Saw one of my partners few days ago with acute neck pain after doing heavy lifting, x-ray shows severe DJD, got prednisone, back to  normal. ?Preventive care: COVID-vaccine is an option ?RTC 03-2022 CPE ?  ?

## 2021-09-11 NOTE — Patient Instructions (Addendum)
Recommend to proceed with the following vaccine at your pharmacy: ?COVID (bivalent) ? ?Check the  blood pressure regularly ?BP GOAL is between 110/65 and  135/85. ?If it is consistently higher or lower, let me know ? ?  ? ? ?GO TO THE LAB : Get the blood work   ? ? ?[place], [place] ?Come back for   a physical exam by November 2023 ?

## 2021-10-15 ENCOUNTER — Other Ambulatory Visit: Payer: Self-pay | Admitting: Internal Medicine

## 2021-10-17 ENCOUNTER — Ambulatory Visit (INDEPENDENT_AMBULATORY_CARE_PROVIDER_SITE_OTHER): Payer: PPO | Admitting: Pharmacist

## 2021-10-17 ENCOUNTER — Ambulatory Visit: Payer: PPO | Admitting: Physician Assistant

## 2021-10-17 DIAGNOSIS — I10 Essential (primary) hypertension: Secondary | ICD-10-CM

## 2021-10-17 DIAGNOSIS — E782 Mixed hyperlipidemia: Secondary | ICD-10-CM

## 2021-10-17 DIAGNOSIS — E039 Hypothyroidism, unspecified: Secondary | ICD-10-CM

## 2021-10-17 NOTE — Patient Instructions (Signed)
Mr. Wedig It was a pleasure speaking with you  Below is a summary of your health goals and care plan  Patient Goals/Self-Care Activities take medications as prescribed,  check blood pressure daily, document, and provide at future appointments, and  target a minimum of 150 minutes of moderate intensity exercise weekly   If you have any questions or concerns, please feel free to contact me either at the phone number below or with a MyChart message.   Keep up the good work!  Cherre Robins, PharmD Clinical Pharmacist [place] [phone] (direct line)  [phone] (main office number)   Patient verbalizes understanding of instructions and care plan provided today and agrees to view in [place]. Active MyChart status and patient understanding of how to access instructions and care plan via MyChart confirmed with patient.

## 2021-10-17 NOTE — Chronic Care Management (AMB) (Signed)
Chronic Care Management Pharmacy Note  10/17/2021 Name:  Kevin [name] MRN:  [id number] DOB:  [birth year]  Summary: Hypertension: home and office blood pressure have been at goal. Continue to check blood pressure at home daily. No change in hypertension therpay.  Hyperlipidemia: LDL at goal  Hypothyroidism: last TSH in therapeutic range. Patient is separating levothyroxine dose and vitamins.   Recommendations/Changes made from today's visit: No med changes.   Plan: No follow up planned at this time. Patient has met medication related goals. Unenrolling from Chronic Care Management  but should need arise please feel free to refer back to Chronic Care Management or for care coordination. Patient is also aware they can reach out to Clinical Pharmacist Practitioner if they have questions regarding medications.    Subjective: Kevin [name] is an 76 y.o. year old male who is a primary patient of Paz, Alda Berthold, MD.  The CCM team was consulted for assistance with disease management and care coordination needs.    Engaged with patient by telephone for follow up visit in response to provider referral for pharmacy case management and/or care coordination services.   Consent to Services:  The patient was given information about Chronic Care Management services, agreed to services, and gave verbal consent prior to initiation of services.  Please see initial visit note for detailed documentation.   Patient Care Team: Colon Branch, MD as PCP - General Danis, Kirke Corin, MD as Consulting Physician (Gastroenterology) Renda Rolls, Jennefer Bravo, MD as Referring Physician (Dermatology) Cherre Robins, [place] (Pharmacist)  Recent office visits: 09/11/2021 - Int Med (Dr Larose Kells) Seen for f/u chronic conditions. Noted neck pain resolved with treatment with Medrol and cyclobenzeprine. labs checked. No med changes noted.  08/27/2021 - Fam Med (Saguier, PA) Acute visit for neck pain. Checked cervical spine Xray -  showed Advanced midcervical spondylosis with trace C5-6 retrolisthesis and areas of bilateral neural foraminal narrowing. Prescribed cyclobenzaprine 17m at bedtime and medrol dose pack.  0/21/2022 - Int Med (Dr PLarose Kells Annual Physical. Removed amlodipine from medication list (patient not taking). Recommended he restart aspirin 824mdialy due to CAD on CT. Labs checked. TSH still low - lowered dose of levothyroxine to 8836mdaily   Recent consult visits: None in last 6 months.  Hospital visits: None in previous 6 months  Objective:  Lab Results  Component Value Date   CREATININE 1.20 09/11/2021   CREATININE 0.91 03/02/2021   CREATININE 1.01 03/01/2020    Lab Results  Component Value Date   HGBA1C 5.8 03/02/2021   Last diabetic Eye exam: No results found for: HMDIABEYEEXA  Last diabetic Foot exam: No results found for: HMDIABFOOTEX      Component Value Date/Time   CHOL 141 03/02/2021 1044   TRIG 153.0 (H) 03/02/2021 1044   HDL 47.30 03/02/2021 1044   CHOLHDL 3 03/02/2021 1044   VLDL 30.6 03/02/2021 1044   LDLCALC 63 03/02/2021 1044   LDLCALC 69 03/01/2020 1502   LDLDIRECT 162.2 08/01/2008 1513       Latest Ref Rng & Units 03/02/2021   10:44 AM 03/01/2020    3:02 PM 12/18/2018    9:58 AM  Hepatic Function  Total Protein 6.0 - 8.3 g/dL 7.1   7.0   7.1    Albumin 3.5 - 5.2 g/dL 4.3    4.4    AST 0 - 37 U/L '22   18   18    ' ALT 0 - 53 U/L 22   17  18    Alk Phosphatase 39 - 117 U/L 60    52    Total Bilirubin 0.2 - 1.2 mg/dL 0.5   0.4   0.5      Lab Results  Component Value Date/Time   TSH 1.24 09/11/2021 10:02 AM   TSH 0.97 04/16/2021 09:51 AM   FREET4 0.79 12/10/2016 09:47 AM   FREET4 0.61 03/11/2016 08:59 AM       Latest Ref Rng & Units 03/02/2021   10:44 AM 03/01/2020    3:02 PM 12/18/2018    9:58 AM  CBC  WBC 4.0 - 10.5 K/uL 4.0   4.7   4.3    Hemoglobin 13.0 - 17.0 g/dL 14.2   14.5   14.8    Hematocrit 39.0 - 52.0 % 42.5   42.6   44.1    Platelets 150.0 -  400.0 K/uL 249.0   231   269.0      No results found for: VD25OH  Clinical ASCVD: No  The 10-year ASCVD risk score (Arnett DK, et al., 2019) is: 28.3%   Values used to calculate the score:     Age: 60 years     Sex: Male     Is Non-Hispanic African American: No     Diabetic: No     Tobacco smoker: No     Systolic Blood Pressure: 546 mmHg     Is BP treated: Yes     HDL Cholesterol: 47.3 mg/dL     Total Cholesterol: 141 mg/dL     Social History   Tobacco Use  Smoking Status Former   Types: Cigarettes   Start date: 1960   Quit date: 05/13/2004   Years since quitting: 17.4  Smokeless Tobacco Never  Tobacco Comments   used to smoke 2 ppd    BP Readings from Last 3 Encounters:  09/11/21 124/68  08/27/21 130/70  03/02/21 (!) 142/80   Pulse Readings from Last 3 Encounters:  09/11/21 (!) 48  08/27/21 63  03/02/21 (!) 58   Wt Readings from Last 3 Encounters:  09/11/21 173 lb 4 oz (78.6 kg)  08/27/21 172 lb 9.6 oz (78.3 kg)  03/02/21 178 lb 6 oz (80.9 kg)    Assessment: Review of patient past medical history, allergies, medications, health status, including review of consultants reports, laboratory and other test data, was performed as part of comprehensive evaluation and provision of chronic care management services.   SDOH:  (Social Determinants of Health) assessments and interventions performed:  SDOH Interventions    Flowsheet Row Most Recent Value  SDOH Interventions   Physical Activity Interventions Other (Comments)  [encouraged to increase frequency of exercise]       CCM Care Plan  Allergies  Allergen Reactions   Tiotropium Bromide Monohydrate Hives   Bromide Ion [Bromine]     Medications Reviewed Today     Reviewed by Cherre Robins, RPH-CPP (Pharmacist) on 10/17/21 at [facility] List Status: <None>   Medication Order Taking? Sig Documenting Provider Last Dose Status Informant  acetaminophen (TYLENOL) 500 MG tablet [id number] Yes Take 500 mg by mouth  every 6 (six) hours as needed. [provider] Taking Active   albuterol (VENTOLIN HFA) 108 (90 Base) MCG/ACT inhaler [id number] No Inhale 2 puffs into the lungs every 6 (six) hours as needed for wheezing or shortness of breath.  Patient not taking: Reported on 10/17/2021   Colon Branch, MD Not Taking Active   aspirin EC 81 MG tablet [id number]  Yes Take 81 mg by mouth daily. Swallow whole. [provider] Taking Active   calcium carbonate (OS-CAL) 600 MG TABS tablet [id number] Yes Take 600 mg by mouth at bedtime.  [provider] Taking Active   Cyanocobalamin (VITAMIN B 12 PO) [id number] Yes Take 1,000 mcg by mouth 3 (three) times a week. [provider] Taking Active   Krill Oil 350 MG CAPS [id number] Yes Take 1 capsule by mouth daily. [provider] Taking Active   levothyroxine (SYNTHROID) 88 MCG tablet [id number] Yes Take 1 tablet (88 mcg total) by mouth daily before breakfast. Colon Branch, MD Taking Active   metoprolol succinate (TOPROL-XL) 50 MG 24 hr tablet [id number] Yes TAKE 1 TABLET BY MOUTH EVERY DAY Colon Branch, MD Taking Active   Multiple Vitamins-Minerals (MULTIVITAMIN PO) [id number] Yes Take 1 tablet by mouth daily. In the evening [provider] Taking Active   simvastatin (ZOCOR) 40 MG tablet [id number] Yes TAKE 1 TABLET BY MOUTH EVERYDAY AT BEDTIME Colon Branch, MD Taking Active   Turmeric 500 MG CAPS [id number] Yes Take 500 mg by mouth daily. [provider] Taking Active   Zinc 50 MG TABS [id number] Yes Take 50 mg by mouth daily. [provider] Taking Active             Patient Active Problem List   Diagnosis Date Noted   Subclinical hypothyroidism 12/10/2016   PCP NOTES >>>>>>>>>>>>>>>>> 12/07/2015   Lower urinary tract symptoms (LUTS) 10/11/2014   Hyperglycemia 03/23/2014   Edema 10/20/2013   Skin lesion 10/20/2013   Annual physical exam 01/24/2011   Hyperlipidemia 03/23/2008   Hypertension 04/15/2007    COPD (chronic obstructive pulmonary disease) ([place]) 04/15/2007    Immunization History  Administered Date(s) Administered   Fluad Quad(high Dose 65+) 01/15/2019   Influenza Split 05/22/2012   Influenza, High Dose Seasonal PF 02/10/2013, 03/11/2016, 01/29/2020, 01/22/2021   Influenza,inj,Quad PF,6+ Mos 02/21/2014   Influenza-Unspecified 02/11/2017, 02/10/2018   PFIZER Comirnaty(Gray Top)Covid-19 Tri-Sucrose Vaccine 09/12/2020   PFIZER(Purple Top)SARS-COV-2 Vaccination 06/03/2019, 06/24/2019, 04/11/2020   Pneumococcal Conjugate-13 03/23/2014   Pneumococcal Polysaccharide-23 04/15/2007, 04/20/2013   Td 05/13/1998, 08/01/2008   Tdap 05/13/2019   Zoster Recombinat (Shingrix) 01/15/2019, 04/16/2019   Zoster, Live 09/14/2013    Conditions to be addressed/monitored: HTN, HLD, COPD and pre DM; hypothyroidism; joint pain  Care Plan : General Pharmacy (Adult)  Updates made by Cherre Robins, RPH-CPP since 10/17/2021 12:00 AM     Problem: CHL AMB "PATIENT-SPECIFIC PROBLEM"      Long-Range Goal: Medication and Chronic Care Management Completed 10/17/2021  Start Date: 10/17/2020  Recent Progress: On track  Priority: High  Note:   Current Barriers:  Unable to maintain control of hypertension - as met goal Does not adhere to prescribed medication regimen - has met goal Does not contact provider office for questions/concerns  Pharmacist Clinical Goal(s):  Over the next 180 days, patient will achieve adherence to monitoring guidelines and medication adherence to achieve therapeutic efficacy maintain control of HTN as evidenced by BP <140/90  adhere to prescribed medication regimen as evidenced by fill history contact provider office for questions/concerns as evidenced notation of same in electronic health record through collaboration with PharmD and provider.   Interventions: 1:1 collaboration with Colon Branch, MD regarding development and update of comprehensive plan of care as evidenced by  provider attestation and co-signature Inter-disciplinary care team collaboration (see longitudinal plan of care) Comprehensive medication review performed; medication list updated in electronic medical record  Hypertension: Controlled; blood pressure goal <140/90 Current treatment: Metoprolol succinate ER 42m daily  Previous  medications tried: amlodipine 566m- stopped due to increased LEE Current home readings: SBP 120-132 and DBP 68 to 80 Denies hypotensive/hypertensive symptoms Reports to today that LEE improved; only occurs occasionally Interventions:  Patient to continue to check blood pressure daily times per week Discussed blood pressure goal Continue current therapy for hypertension   Hyperlipidemia: Controlled; LDL goal <100 Current treatment: Simvastatin 4039maily  [facility] Interventions:  Discussed last lipid panel and LDL goal Continue current regimen for lipids Increase exercise to goal of 150 minutes per week.   Chronic Obstructive Pulmonary Disease: Controlled; patient reports no need for rescue inhaler in over a year and not SOB or wheezing.  Current treatment: Prescribed Flovent HFA 37m30mnd albuterol HFA but patient reports he has not used either in over a year and does not feel that he needs them COPD class A No history of PFTs;  Last visit with pulmonologist, Dr OlalAnder Slade 02/2019  One episode of acute bronchitis 03/19/2019.   Interventions:  Recommend he consider follow up with pulmonologist to evaluate lung function  Keep albuterol inhaler on hand for rescue Updated medication list and removed Flovent HFA  Medication Management:  [facility] Reviewed medications and updated medication list Reviewed refill history and discussed adherence  SODH:  Patient has [place] [facility] [place] and [place]. Patient decided not to complete application for assistance  for utilities. Using food bank when needed for food.    Patient Goals/Self-Care Activities Over the next 180 days, patient will:  take medications as prescribed,  check blood pressure 2 to 3 times per week, document, and provide at future appointments, and  target a minimum of 150 minutes of moderate intensity exercise weekly  Follow Up Plan: No further follow up required: patient has met medication and other chronic condition goals.          Medication Assistance: None required.  Patient affirms current coverage meets needs.  [id number] [place] [id number]: [phone]: Patient has met medication related goals. Unenrolling from Chronic Care Management  but should need arise please feel free to refer back to Chronic Care Management or for care coordination. Patient is also aware they can reach out to Clinical Pharmacist Practitioner if they have questions regarding medications.    TammyCherre RobinsrmD Clinical Pharmacist [phone]

## 2021-10-22 ENCOUNTER — Encounter: Payer: Self-pay | Admitting: Physician Assistant

## 2021-10-22 ENCOUNTER — Ambulatory Visit: Payer: PPO | Admitting: Physician Assistant

## 2021-10-22 DIAGNOSIS — M542 Cervicalgia: Secondary | ICD-10-CM | POA: Diagnosis not present

## 2021-10-22 NOTE — Progress Notes (Signed)
Office Visit Note   Patient: Kevin [name]           Date of Birth: 1945-12-13           MRN: [id number] Visit Date: 10/22/2021              Requested by: Colon Branch, [address] [place],  [place] [address] PCP: Colon Branch, MD   Assessment & Plan: Visit Diagnoses:  1. Cervicalgia     Plan: Send in formal physical therapy at [facility] for range of motion strengthening home exercise program and modalities to C-spine.  He will follow-up with [place] if his pain persist or becomes worse.  Questions were encouraged and answered at length.  Follow-Up Instructions: Return if symptoms worsen or fail to improve.   Orders:  No orders of the defined types were placed in this encounter.  No orders of the defined types were placed in this encounter.     Procedures: No procedures performed   Clinical Data: No additional findings.   Subjective: Chief Complaint  Patient presents with   Neck - Pain    HPI [name] pleasant 76 year old male well-known to Dr. Delilah Shan service comes in today with neck pain this been ongoing since around Easter.  He denies any radicular symptoms down either arm.  He states that his pain is worse with 6-7 out of 10 pain but not over the past week.  He is having very minimal discomfort in his neck just feels stiff at this point time.  He states that back in April he developed neck pain after catching a bag of concrete that he was lifting.  He missed the handle.  He has had no treatment for this.  At mostly having pain on the top of the shoulder and into the neck on the right side.  Cervical spine films dated 08/27/2021 were personally reviewed and show no acute findings.  Degenerative changes mostly involving C 3 through C6.  Foraminal narrowing at multiple levels but most significant at C3-C4 and C4-C5.  Review of Systems  See HPI otherwise negative Objective: Vital Signs: There were no vitals taken for this visit.  Physical  Exam General: Well-developed well-nourished male no acute distress.  Affect appropriate Ortho Exam Cervical spine he has full extension and flexion.  Rotation of the right causes discomfort.  He has positive Spurling's.  Upper extremities 5 out of 5 strength throughout the upper extremities against resistance including his rotator cuff strength with external and internal rotation against resistance.  Sensation grossly intact bilateral hands to light touch.  Radial pulses are 2+ and equal symmetric bilaterally. Specialty Comments:  No specialty comments available.  Imaging: No results found.   PMFS History: Patient Active Problem List   Diagnosis Date Noted   Subclinical hypothyroidism 12/10/2016   PCP NOTES >>>>>>>>>>>>>>>>> 12/07/2015   Lower urinary tract symptoms (LUTS) 10/11/2014   Hyperglycemia 03/23/2014   Edema 10/20/2013   Skin lesion 10/20/2013   Annual physical exam 01/24/2011   Hyperlipidemia 03/23/2008   Hypertension 04/15/2007   COPD (chronic obstructive pulmonary disease) ([place]) 04/15/2007   Past Medical History:  Diagnosis Date   COPD (chronic obstructive pulmonary disease) ([place])    PFTs 01-2006 mild obstruction, (+) reponse to Bronchodilators     Hyperlipidemia    Hypertension    Shingles    Thorax, 2008   Thyroid disease     Family History  Problem Relation Age of Onset  CAD Father        F MI ~ 67 y/o   Hypertension Mother        M and S   Colon cancer Neg Hx    Prostate cancer Neg Hx    Diabetes Neg Hx    Rectal cancer Neg Hx    Stomach cancer Neg Hx     Past Surgical History:  Procedure Laterality Date   COLONOSCOPY  11/16/2008   Sharlett Iles   MULTIPLE TOOTH EXTRACTIONS     dentures upper and partial lower   NO PAST SURGERIES     Social History   Occupational History   Occupation: retired 01-2013    Employer: [employer]  Tobacco Use   Smoking status: Former    Types: Cigarettes    Start date: 1960    Quit date: 05/13/2004    Years since  quitting: 17.4   Smokeless tobacco: Never   Tobacco comments:    used to smoke 2 ppd   Vaping Use   Vaping Use: Never used  Substance and Sexual Activity   Alcohol use: Yes    Alcohol/week: 7.0 - 14.0 standard drinks of alcohol    Types: 7 - 14 Cans of beer per week    Comment: 2-3  beers some days   Drug use: No   Sexual activity: Not Currently

## 2021-10-22 NOTE — Addendum Note (Signed)
Addended by: Robyne Peers on: 10/22/2021 01:54 PM   Modules accepted: Orders

## 2021-11-01 ENCOUNTER — Ambulatory Visit: Payer: PPO | Attending: Physician Assistant | Admitting: Physical Therapy

## 2021-11-01 ENCOUNTER — Encounter: Payer: Self-pay | Admitting: Physical Therapy

## 2021-11-01 DIAGNOSIS — M5412 Radiculopathy, cervical region: Secondary | ICD-10-CM | POA: Diagnosis not present

## 2021-11-01 DIAGNOSIS — M6281 Muscle weakness (generalized): Secondary | ICD-10-CM | POA: Diagnosis not present

## 2021-11-01 DIAGNOSIS — R278 Other lack of coordination: Secondary | ICD-10-CM | POA: Diagnosis not present

## 2021-11-01 DIAGNOSIS — R293 Abnormal posture: Secondary | ICD-10-CM | POA: Insufficient documentation

## 2021-11-01 DIAGNOSIS — M542 Cervicalgia: Secondary | ICD-10-CM | POA: Diagnosis not present

## 2021-11-01 NOTE — Therapy (Signed)
OUTPATIENT PHYSICAL THERAPY CERVICAL EVALUATION   Patient Name: Kevin [name] MRN: [id number] DOB:June 14, 1945, 76 y.o., male Today's Date: 11/01/2021   PT End of Session - 11/01/21 1302     Visit Number 1    Date for PT Re-Evaluation 01/10/22    PT Start Time 0930    PT Stop Time 1010    PT Time Calculation (min) 40 min    Activity Tolerance Patient tolerated treatment well    Behavior During Therapy [facility] for tasks assessed/performed             Past Medical History:  Diagnosis Date   COPD (chronic obstructive pulmonary disease) ([place])    PFTs 01-2006 mild obstruction, (+) reponse to Bronchodilators     Hyperlipidemia    Hypertension    Shingles    Thorax, 2008   Thyroid disease    Past Surgical History:  Procedure Laterality Date   COLONOSCOPY  11/16/2008   [facility]   MULTIPLE TOOTH EXTRACTIONS     dentures upper and partial lower   NO PAST SURGERIES     Patient Active Problem List   Diagnosis Date Noted   Subclinical hypothyroidism 12/10/2016   PCP NOTES >>>>>>>>>>>>>>>>> 12/07/2015   Lower urinary tract symptoms (LUTS) 10/11/2014   Hyperglycemia 03/23/2014   Edema 10/20/2013   Skin lesion 10/20/2013   Annual physical exam 01/24/2011   Hyperlipidemia 03/23/2008   Hypertension 04/15/2007   COPD (chronic obstructive pulmonary disease) ([place]) 04/15/2007    PCP: Colon Branch  REFERRING PROVIDER: Pete Pelt, PA-C   REFERRING DIAG: M54.2 (ICD-10-CM) - Cervicalgia   THERAPY DIAG:  Radiculopathy, cervical region  Abnormal posture  Muscle weakness (generalized)  Other lack of coordination  Rationale for Evaluation and Treatment Rehabilitation  ONSET DATE: 10/22/2021   SUBJECTIVE:                                                                                                                                                                                                         SUBJECTIVE STATEMENT: Patient reports that about 2-3 months ago he was  lifting a bag of concrete that slipped, causing him to catch it with his R arm. He developed pain the next day in his R shoulder and neck pain. He took a round of steroids which helped it, but it still fluctuates.  PERTINENT HISTORY:  neck pain this been ongoing since around Easter. He denies any radicular symptoms down either arm. He states that his pain is worse with 6-7 out of 10 pain but not over the past week.  He is having very minimal discomfort in his neck just feels stiff at this point time. He states that back in April he developed neck pain after catching a bag of concrete that he was lifting. He missed the handle. He has had no treatment for this. At mostly having pain on the top of the shoulder and into the neck on the right side.   PAIN:  Are you having pain? Yes: NPRS scale: 8/10 Pain location: R neck and shoulder, but can also feel stiffness on L Pain description: catches, sharp Aggravating factors: turning head, first thing in the morning Relieving factors: Bio Freeze , Tylenol  PRECAUTIONS: None  WEIGHT BEARING RESTRICTIONS No  FALLS:  Has patient fallen in last 6 months? No  LIVING ENVIRONMENT: Lives with: lives with their spouse Lives in: House/apartment No issues at home  OCCUPATION: retired, goes to [employer], but has had to cut back due to the pain.  PLOF: Independent  PATIENT GOALS Get rid of the pain.  OBJECTIVE:   DIAGNOSTIC FINDINGS:  Cervical spine films dated 08/27/2021 were personally reviewed and show no acute findings. Degenerative changes mostly involving C 3 through C6. Foraminal narrowing at multiple levels but most significant at C3-C4 and C4-C5.   PATIENT SURVEYS:  FOTO 50   COGNITION: Overall cognitive status: Within functional limits for tasks assessed   SENSATION: WFL  POSTURE: rounded shoulders, forward head, and R shoulder held lower than L, R scapula lower than L, abducted and rotated up.  PALPATION: Patient with muscle tightness  throughout B up traps, paraspinals-cerv, ant neck muscles, pects. TTP on Rside throughout.   CERVICAL ROM:   Active ROM A/PROM (deg) eval  Flexion 80 %  Extension 30  Right lateral flexion 30  Left lateral flexion 40  Right rotation 50  Left rotation 50   (Blank rows = not tested)  UPPER EXTREMITY ROM: Grossly WFL, painful to elevate arms above head or place hands behind head.   UPPER EXTREMITY MMT: BUE strength WNL   CERVICAL SPECIAL TESTS:  Spurling's test: Positive  TODAY'S TREATMENT:  STM and stretch to neck and shoulders, patient education.   PATIENT EDUCATION:  Education details: POC, HEP Person educated: Patient Education method: Consulting civil engineer, Media planner, and Handouts Education comprehension: verbalized understanding   HOME EXERCISE PROGRAM: [id number]  ASSESSMENT:  CLINICAL IMPRESSION: Patient is a 76 y.o. who was seen today for physical therapy evaluation and treatment for R sides neck and shoulder pain after dropping a bag of concrete and trying to catch it with RUE. He demonstrates severe tightness throughout B cervical paraspinals and shoulders, with pain in R side. He shows functional weakness due to pain, limiting his daily activities. He will benefit from PT for treatment of his acute pain as well as STM, stretch, and strengthening to maintain painfree mobility.   OBJECTIVE IMPAIRMENTS decreased coordination, decreased mobility, decreased ROM, decreased strength, increased fascial restrictions, increased muscle spasms, impaired flexibility, impaired UE functional use, improper body mechanics, postural dysfunction, and pain.   ACTIVITY LIMITATIONS carrying, lifting, bending, and sleeping  PARTICIPATION LIMITATIONS: cleaning, laundry, driving, and yard work  PERSONAL FACTORS Age and Past/current experiences are also affecting patient's functional outcome.   REHAB POTENTIAL: Good  CLINICAL DECISION MAKING: Evolving/moderate complexity  EVALUATION  COMPLEXITY: Moderate   GOALS: Goals reviewed with patient? Yes  SHORT TERM GOALS: Target date: 11/29/2021   I with basic HEP Baseline:  Goal status: INITIAL LONG TERM GOALS;  Target Date: 01/10/22   1 I with final HEP  Baseline:   Goal status: initial  2.  Patient will improve cervical ROM to at least 80% of full ROM    Baseline:   Goals status: initial  3.  Increase FOTO score to at least 62   Baseline: 50   Goal status: initial    4.  Patient will complete all of his daily activities with pain score </=3/10    Baseline: up to 8/10    Goal status: Initial   PLAN: PT FREQUENCY: 2x/week  PT DURATION: 10 weeks  PLANNED INTERVENTIONS: Therapeutic exercises, Therapeutic activity, Neuromuscular re-education, Balance training, Gait training, Patient/Family education, Joint mobilization, Dry Needling, Electrical stimulation, Cryotherapy, Moist heat, Vasopneumatic device, Traction, Ultrasound, Ionotophoresis '4mg'$ /ml Dexamethasone, and Manual therapy  PLAN FOR NEXT SESSION: Expand HEP, treat acute pain, increase mobility in neck and shoulders.   Marcelina Morel, DPT 11/01/2021, 1:08 PM

## 2021-11-09 DIAGNOSIS — J449 Chronic obstructive pulmonary disease, unspecified: Secondary | ICD-10-CM

## 2021-11-09 DIAGNOSIS — I10 Essential (primary) hypertension: Secondary | ICD-10-CM | POA: Diagnosis not present

## 2021-11-09 DIAGNOSIS — E785 Hyperlipidemia, unspecified: Secondary | ICD-10-CM

## 2021-11-16 ENCOUNTER — Encounter: Payer: Self-pay | Admitting: Physical Therapy

## 2021-11-16 ENCOUNTER — Ambulatory Visit: Payer: PPO | Attending: Physician Assistant | Admitting: Physical Therapy

## 2021-11-16 DIAGNOSIS — R293 Abnormal posture: Secondary | ICD-10-CM | POA: Insufficient documentation

## 2021-11-16 DIAGNOSIS — M6281 Muscle weakness (generalized): Secondary | ICD-10-CM | POA: Diagnosis not present

## 2021-11-16 DIAGNOSIS — M5412 Radiculopathy, cervical region: Secondary | ICD-10-CM | POA: Diagnosis not present

## 2021-11-16 DIAGNOSIS — R278 Other lack of coordination: Secondary | ICD-10-CM | POA: Diagnosis not present

## 2021-11-16 NOTE — Therapy (Signed)
OUTPATIENT PHYSICAL THERAPY CERVICAL EVALUATION   Patient Name: Kevin [name] MRN: [id number] DOB:1946/04/09, 76 y.o., male Today's Date: 11/16/2021   PT End of Session - 11/16/21 0843     Visit Number 2    Date for PT Re-Evaluation 01/10/22    PT Start Time 0845    PT Stop Time 0930    PT Time Calculation (min) 45 min    Activity Tolerance Patient tolerated treatment well    Behavior During Therapy [facility] for tasks assessed/performed             Past Medical History:  Diagnosis Date   COPD (chronic obstructive pulmonary disease) ([place])    PFTs 01-2006 mild obstruction, (+) reponse to Bronchodilators     Hyperlipidemia    Hypertension    Shingles    Thorax, 2008   Thyroid disease    Past Surgical History:  Procedure Laterality Date   COLONOSCOPY  11/16/2008   [facility]   MULTIPLE TOOTH EXTRACTIONS     dentures upper and partial lower   NO PAST SURGERIES     Patient Active Problem List   Diagnosis Date Noted   Subclinical hypothyroidism 12/10/2016   PCP NOTES >>>>>>>>>>>>>>>>> 12/07/2015   Lower urinary tract symptoms (LUTS) 10/11/2014   Hyperglycemia 03/23/2014   Edema 10/20/2013   Skin lesion 10/20/2013   Annual physical exam 01/24/2011   Hyperlipidemia 03/23/2008   Hypertension 04/15/2007   COPD (chronic obstructive pulmonary disease) ([place]) 04/15/2007    PCP: Colon Branch  REFERRING PROVIDER: Pete Pelt, PA-C   REFERRING DIAG: M54.2 (ICD-10-CM) - Cervicalgia   THERAPY DIAG:  Radiculopathy, cervical region  Abnormal posture  Muscle weakness (generalized)  Rationale for Evaluation and Treatment Rehabilitation  ONSET DATE: 10/22/2021   SUBJECTIVE:                                                                                                                                                                                                         SUBJECTIVE STATEMENT: Feeling pretty good, doing all the exercises  PERTINENT HISTORY:  neck pain  this been ongoing since around Easter. He denies any radicular symptoms down either arm. He states that his pain is worse with 6-7 out of 10 pain but not over the past week. He is having very minimal discomfort in his neck just feels stiff at this point time. He states that back in April he developed neck pain after catching a bag of concrete that he was lifting. He missed the handle. He has had no treatment for this. At mostly  having pain on the top of the shoulder and into the neck on the right side.   PAIN:  Are you having pain? Yes: NPRS scale: 0/10 Pain location: R neck and shoulder, but can also feel stiffness on L Pain description: catches, sharp Aggravating factors: turning head, first thing in the morning Relieving factors: Bio Freeze , Tylenol  PRECAUTIONS: None     LIVING ENVIRONMENT: Lives with: lives with their spouse Lives in: House/apartment No issues at home  OCCUPATION: retired, goes to [employer], but has had to cut back due to the pain.  PLOF: Independent  PATIENT GOALS Get rid of the pain.  OBJECTIVE:   DIAGNOSTIC FINDINGS:  Cervical spine films dated 08/27/2021 were personally reviewed and show no acute findings. Degenerative changes mostly involving C 3 through C6. Foraminal narrowing at multiple levels but most significant at C3-C4 and C4-C5.   POSTURE: rounded shoulders, forward head, and R shoulder held lower than L, R scapula lower than L, abducted and rotated up.  PALPATION: Patient with muscle tightness throughout B up traps, paraspinals-cerv, ant neck muscles, pects. TTP on Rside throughout.   CERVICAL ROM:   Active ROM A/PROM (deg) eval  Flexion 80 %  Extension 30  Right lateral flexion 30  Left lateral flexion 40  Right rotation 50  Left rotation 50   (Blank rows = not tested)  UPPER EXTREMITY ROM: Grossly WFL, painful to elevate arms above head or place hands behind head.   UPPER EXTREMITY MMT: BUE strength WNL   CERVICAL SPECIAL TESTS:   Spurling's test: Positive  TODAY'S TREATMENT:  11/16/21 NuStep L5 x 6 min  STM UT Rhomboid area TP in R rhomboid area  Rows & Lats 25lb 2x10  Shoulder Er green 2x10 Shoulder Ext green 2x10 W backs 2x10 Shoulder Flex 3lb 2x10 Shoulder Abd 2lb 2x10  Triceps Ext 25lb 2x15   Eval STM and stretch to neck and shoulders, patient education.   PATIENT EDUCATION:  Education details: POC, HEP Person educated: Patient Education method: Consulting civil engineer, Media planner, and Handouts Education comprehension: verbalized understanding   HOME EXERCISE PROGRAM: [id number]  ASSESSMENT:  CLINICAL IMPRESSION: Patient enters feeling well reporting less neck and shoulder pain overall. Pt tolerated session well evident by no subjective reports of increase pain. Some ROM limitations noted with W backs against wall. Some postural restrictions present with limited ROM doing ER. Pt has forwards rounded shoulders at rest. TP in rhomboid area improved post session.   OBJECTIVE IMPAIRMENTS decreased coordination, decreased mobility, decreased ROM, decreased strength, increased fascial restrictions, increased muscle spasms, impaired flexibility, impaired UE functional use, improper body mechanics, postural dysfunction, and pain.   ACTIVITY LIMITATIONS carrying, lifting, bending, and sleeping  PARTICIPATION LIMITATIONS: cleaning, laundry, driving, and yard work  PERSONAL FACTORS Age and Past/current experiences are also affecting patient's functional outcome.   REHAB POTENTIAL: Good  CLINICAL DECISION MAKING: Evolving/moderate complexity  EVALUATION COMPLEXITY: Moderate   GOALS: Goals reviewed with patient? Yes  SHORT TERM GOALS: Target date: 12/14/2021   I with basic HEP Baseline:  Goal status: INITIAL LONG TERM GOALS;  Target Date: 01/10/22   1 I with final HEP   Baseline:   Goal status: initial  2.  Patient will improve cervical ROM to at least 80% of full ROM    Baseline:   Goals status:  initial  3.  Increase FOTO score to at least 62   Baseline: 50   Goal status: initial    4.  Patient will complete all of his  daily activities with pain score </=3/10    Baseline: up to 8/10    Goal status: Initial   PLAN: PT FREQUENCY: 2x/week  PT DURATION: 10 weeks  PLANNED INTERVENTIONS: Therapeutic exercises, Therapeutic activity, Neuromuscular re-education, Balance training, Gait training, Patient/Family education, Joint mobilization, Dry Needling, Electrical stimulation, Cryotherapy, Moist heat, Vasopneumatic device, Traction, Ultrasound, Ionotophoresis '4mg'$ /ml Dexamethasone, and Manual therapy  PLAN FOR NEXT SESSION: Expand HEP, treat acute pain, increase mobility in neck and shoulders.   Marcelina Morel, DPT 11/16/2021, 8:44 AM

## 2021-11-19 ENCOUNTER — Ambulatory Visit: Payer: PPO

## 2021-11-21 ENCOUNTER — Encounter: Payer: Self-pay | Admitting: Physical Therapy

## 2021-11-21 ENCOUNTER — Ambulatory Visit: Payer: PPO | Admitting: Physical Therapy

## 2021-11-21 DIAGNOSIS — M5412 Radiculopathy, cervical region: Secondary | ICD-10-CM

## 2021-11-21 DIAGNOSIS — R293 Abnormal posture: Secondary | ICD-10-CM

## 2021-11-21 DIAGNOSIS — M6281 Muscle weakness (generalized): Secondary | ICD-10-CM

## 2021-11-21 NOTE — Therapy (Signed)
OUTPATIENT PHYSICAL THERAPY CERVICAL TREATMENT   Patient Name: Kevin [name] MRN: [id number] DOB:Aug 23, 1945, 76 y.o., male Today's Date: 11/21/2021   PT End of Session - 11/21/21 0930     Visit Number 3    Date for PT Re-Evaluation 01/10/22    PT Start Time 0845    PT Stop Time 0928    PT Time Calculation (min) 43 min    Activity Tolerance Patient tolerated treatment well    Behavior During Therapy [facility] for tasks assessed/performed             Past Medical History:  Diagnosis Date   COPD (chronic obstructive pulmonary disease) ([place])    PFTs 01-2006 mild obstruction, (+) reponse to Bronchodilators     Hyperlipidemia    Hypertension    Shingles    Thorax, 2008   Thyroid disease    Past Surgical History:  Procedure Laterality Date   COLONOSCOPY  11/16/2008   [facility]   MULTIPLE TOOTH EXTRACTIONS     dentures upper and partial lower   NO PAST SURGERIES     Patient Active Problem List   Diagnosis Date Noted   Subclinical hypothyroidism 12/10/2016   PCP NOTES >>>>>>>>>>>>>>>>> 12/07/2015   Lower urinary tract symptoms (LUTS) 10/11/2014   Hyperglycemia 03/23/2014   Edema 10/20/2013   Skin lesion 10/20/2013   Annual physical exam 01/24/2011   Hyperlipidemia 03/23/2008   Hypertension 04/15/2007   COPD (chronic obstructive pulmonary disease) ([place]) 04/15/2007    PCP: Colon Branch  REFERRING PROVIDER: Pete Pelt, PA-C   REFERRING DIAG: M54.2 (ICD-10-CM) - Cervicalgia   THERAPY DIAG:  Radiculopathy, cervical region  Abnormal posture  Muscle weakness (generalized)  Rationale for Evaluation and Treatment Rehabilitation  ONSET DATE: 10/22/2021   SUBJECTIVE:                                                                                                                                                                                                         SUBJECTIVE STATEMENT: No pain, just tight. The massage really helped last time.   PERTINENT  HISTORY:  neck pain this been ongoing since around Easter. He denies any radicular symptoms down either arm. He states that his pain is worse with 6-7 out of 10 pain but not over the past week. He is having very minimal discomfort in his neck just feels stiff at this point time. He states that back in April he developed neck pain after catching a bag of concrete that he was lifting. He missed the handle. He has had no treatment  for this. At mostly having pain on the top of the shoulder and into the neck on the right side.   PAIN:  Are you having pain? No  PRECAUTIONS: None     LIVING ENVIRONMENT: Lives with: lives with their spouse Lives in: House/apartment No issues at home  OCCUPATION: retired, goes to [employer], but has had to cut back due to the pain.  PLOF: Independent  PATIENT GOALS Get rid of the pain.  OBJECTIVE:   DIAGNOSTIC FINDINGS:  Cervical spine films dated 08/27/2021 were personally reviewed and show no acute findings. Degenerative changes mostly involving C 3 through C6. Foraminal narrowing at multiple levels but most significant at C3-C4 and C4-C5.   POSTURE: rounded shoulders, forward head, and R shoulder held lower than L, R scapula lower than L, abducted and rotated up.  PALPATION: Patient with muscle tightness throughout B up traps, paraspinals-cerv, ant neck muscles, pects. TTP on Rside throughout.   CERVICAL ROM:   Active ROM A/PROM (deg) eval  Flexion 80 %  Extension 30  Right lateral flexion 30  Left lateral flexion 40  Right rotation 50  Left rotation 50   (Blank rows = not tested)  UPPER EXTREMITY ROM: Grossly WFL, painful to elevate arms above head or place hands behind head.   UPPER EXTREMITY MMT: BUE strength WNL   CERVICAL SPECIAL TESTS:  Spurling's test: Positive  TODAY'S TREATMENT:  11/21/21 UBE L3 x 6 min, 3 min forward/backwards Rows/lat pull down 25# 2x10 Shoulder extension with green TB 2x12 B shoulder abduction/flexion 3#  2x10 Power tower shoulder extension/rows 10# 2x10 each Self stretch of UT 30 secs x2 Self strech of pecs 30 secs x2 STM to UT and right rhomboid area  11/16/21 NuStep L5 x 6 min  STM UT Rhomboid area TP in R rhomboid area  Rows & Lats 25lb 2x10  Shoulder Er green 2x10 Shoulder Ext green 2x10 W backs 2x10 Shoulder Flex 3lb 2x10 Shoulder Abd 2lb 2x10  Triceps Ext 25lb 2x15   Eval STM and stretch to neck and shoulders, patient education.   PATIENT EDUCATION:  Education details: POC, HEP Person educated: Patient Education method: Explanation, Media planner, and Handouts Education comprehension: verbalized understanding   HOME EXERCISE PROGRAM: [id number]  ASSESSMENT:  CLINICAL IMPRESSION: Pt enters today doing well. States he has been doing his exercises at home and also goes to the [employer]. States he just feels tight in his neck and upper back. We did some upper extremity strengthening followed by stretching and STM. Needs verbal cues for upright posture throughout session. He states he feel like PT is helping but would benefit from additional strengthening for posture and stretching for increase range of motion. Showed him some self stretched of UT/pecs that he can do at home. States he feels relief with STM. Would likely benefit from DN to right rhomboid area.    OBJECTIVE IMPAIRMENTS decreased coordination, decreased mobility, decreased ROM, decreased strength, increased fascial restrictions, increased muscle spasms, impaired flexibility, impaired UE functional use, improper body mechanics, postural dysfunction, and pain.   ACTIVITY LIMITATIONS carrying, lifting, bending, and sleeping  PARTICIPATION LIMITATIONS: cleaning, laundry, driving, and yard work  PERSONAL FACTORS Age and Past/current experiences are also affecting patient's functional outcome.   REHAB POTENTIAL: Good  CLINICAL DECISION MAKING: Evolving/moderate complexity  EVALUATION COMPLEXITY:  Moderate   GOALS: Goals reviewed with patient? Yes  SHORT TERM GOALS: Target date: 12/19/2021   I with basic HEP Baseline:  Goal status: Met LONG TERM GOALS;  Target  Date: 01/10/22   1 I with final HEP   Baseline:   Goal status: initial  2.  Patient will improve cervical ROM to at least 80% of full ROM    Baseline:   Goals status: initial  3.  Increase FOTO score to at least 62   Baseline: 50   Goal status: initial    4.  Patient will complete all of his daily activities with pain score </=3/10    Baseline: up to 8/10    Goal status: Initial   PLAN: PT FREQUENCY: 2x/week  PT DURATION: 10 weeks  PLANNED INTERVENTIONS: Therapeutic exercises, Therapeutic activity, Neuromuscular re-education, Balance training, Gait training, Patient/Family education, Joint mobilization, Dry Needling, Electrical stimulation, Cryotherapy, Moist heat, Vasopneumatic device, Traction, Ultrasound, Ionotophoresis 32m/ml Dexamethasone, and Manual therapy  PLAN FOR NEXT SESSION: Expand HEP, treat acute pain and work on ROM, maybe DN.  Francisca Harbuck, SPTA 11/21/2021, 9:33 AM

## 2021-11-26 ENCOUNTER — Ambulatory Visit: Payer: PPO | Admitting: Physical Therapy

## 2021-11-26 ENCOUNTER — Encounter: Payer: Self-pay | Admitting: Physical Therapy

## 2021-11-26 DIAGNOSIS — R293 Abnormal posture: Secondary | ICD-10-CM

## 2021-11-26 DIAGNOSIS — M6281 Muscle weakness (generalized): Secondary | ICD-10-CM

## 2021-11-26 DIAGNOSIS — R278 Other lack of coordination: Secondary | ICD-10-CM

## 2021-11-26 DIAGNOSIS — M5412 Radiculopathy, cervical region: Secondary | ICD-10-CM

## 2021-11-26 NOTE — Therapy (Signed)
OUTPATIENT PHYSICAL THERAPY CERVICAL TREATMENT   Patient Name: Kevin [name] MRN: [id number] DOB:07/29/1945, 76 y.o., male Today's Date: 11/26/2021   PT End of Session - 11/26/21 0848     Visit Number 4    Date for PT Re-Evaluation 01/10/22    PT Start Time 0847    PT Stop Time 0930    PT Time Calculation (min) 43 min    Activity Tolerance Patient tolerated treatment well    Behavior During Therapy WFL for tasks assessed/performed             Past Medical History:  Diagnosis Date   COPD (chronic obstructive pulmonary disease) (HCC)    PFTs 01-2006 mild obstruction, (+) reponse to Bronchodilators     Hyperlipidemia    Hypertension    Shingles    Thorax, 2008   Thyroid disease    Past Surgical History:  Procedure Laterality Date   COLONOSCOPY  11/16/2008   [facility]   MULTIPLE TOOTH EXTRACTIONS     dentures upper and partial lower   NO PAST SURGERIES     Patient Active Problem List   Diagnosis Date Noted   Subclinical hypothyroidism 12/10/2016   PCP NOTES >>>>>>>>>>>>>>>>> 12/07/2015   Lower urinary tract symptoms (LUTS) 10/11/2014   Hyperglycemia 03/23/2014   Edema 10/20/2013   Skin lesion 10/20/2013   Annual physical exam 01/24/2011   Hyperlipidemia 03/23/2008   Hypertension 04/15/2007   COPD (chronic obstructive pulmonary disease) (HCC) 04/15/2007    PCP: Paz, Jose E  REFERRING PROVIDER: Clark, Gilbert W, PA-C   REFERRING DIAG: M54.2 (ICD-10-CM) - Cervicalgia   THERAPY DIAG:  Radiculopathy, cervical region  Abnormal posture  Muscle weakness (generalized)  Other lack of coordination  Rationale for Evaluation and Treatment Rehabilitation  ONSET DATE: 10/22/2021   SUBJECTIVE:                                                                                                                                                                                                         SUBJECTIVE STATEMENT: Feeling great, no pain and a little light.    PERTINENT HISTORY:  neck pain this been ongoing since around Easter. He denies any radicular symptoms down either arm. He states that his pain is worse with 6-7 out of 10 pain but not over the past week. He is having very minimal discomfort in his neck just feels stiff at this point time. He states that back in April he developed neck pain after catching a bag of concrete that he was lifting. He missed the handle. He has   had no treatment for this. At mostly having pain on the top of the shoulder and into the neck on the right side.   PAIN:  Are you having pain? No  PRECAUTIONS: None     LIVING ENVIRONMENT: Lives with: lives with their spouse Lives in: House/apartment No issues at home  OCCUPATION: retired, goes to [employer], but has had to cut back due to the pain.  PLOF: Independent  PATIENT GOALS Get rid of the pain.  OBJECTIVE:   DIAGNOSTIC FINDINGS:  Cervical spine films dated 08/27/2021 were personally reviewed and show no acute findings. Degenerative changes mostly involving C 3 through C6. Foraminal narrowing at multiple levels but most significant at C3-C4 and C4-C5.   POSTURE: rounded shoulders, forward head, and R shoulder held lower than L, R scapula lower than L, abducted and rotated up.  PALPATION: Patient with muscle tightness throughout B up traps, paraspinals-cerv, ant neck muscles, pects. TTP on Rside throughout.   CERVICAL ROM:   Active ROM A/PROM (deg) eval  Flexion 80 %  Extension 30  Right lateral flexion 30  Left lateral flexion 40  Right rotation 50  Left rotation 50   (Blank rows = not tested)  UPPER EXTREMITY ROM: Grossly WFL, painful to elevate arms above head or place hands behind head.   UPPER EXTREMITY MMT: BUE strength WNL   CERVICAL SPECIAL TESTS:  Spurling's test: Positive  TODAY'S TREATMENT:  11/26/21 UBE L3 x 6 min, 3 min forward/backwards Rows/lat pull down 35# 2x12 Power tower shoulder extension/rows 10# 2x10 each Tricep  extension 25# 2x10 Shoulder abduction 3# 2x10 Self stretch of UT/pecs STM to UT/R rhomboid area  11/21/21 UBE L3 x 6 min, 3 min forward/backwards Rows/lat pull down 25# 2x10 Shoulder extension with green TB 2x12 B shoulder abduction/flexion 3# 2x10 Power tower shoulder extension/rows 10# 2x10 each Self stretch of UT 30 secs x2 Self strech of pecs 30 secs x2 STM to UT and right rhomboid area  11/16/21 NuStep L5 x 6 min  STM UT Rhomboid area TP in R rhomboid area  Rows & Lats 25lb 2x10  Shoulder Er green 2x10 Shoulder Ext green 2x10 W backs 2x10 Shoulder Flex 3lb 2x10 Shoulder Abd 2lb 2x10  Triceps Ext 25lb 2x15   Eval STM and stretch to neck and shoulders, patient education.   PATIENT EDUCATION:  Education details: HEP, stretching Person educated: Patient Education method: Explanation, Demonstration, and Handouts Education comprehension: verbalized understanding   HOME EXERCISE PROGRAM: [id number]  ASSESSMENT:  CLINICAL IMPRESSION: Pt enters today doing well. States he just feels tight in his neck and upper back but that it has improved since the last time he was here. We did some upper extremity strengthening followed by stretching and STM. Needs verbal cues for upright posture throughout session. Noticeably less tight in R UT/rhomboids since last session but would benefit from additional strengthening for posture and stretching for increase range of motion. Has been doing HEP over the weekend and states he is going to go to the [employer] Mon/Wed/Fri.    OBJECTIVE IMPAIRMENTS decreased coordination, decreased mobility, decreased ROM, decreased strength, increased fascial restrictions, increased muscle spasms, impaired flexibility, impaired UE functional use, improper body mechanics, postural dysfunction, and pain.   ACTIVITY LIMITATIONS carrying, lifting, bending, and sleeping  PARTICIPATION LIMITATIONS: cleaning, laundry, driving, and yard work  PERSONAL FACTORS Age and  Past/current experiences are also affecting patient's functional outcome.   REHAB POTENTIAL: Good  CLINICAL DECISION MAKING: Evolving/moderate complexity  EVALUATION COMPLEXITY: Moderate     GOALS: Goals reviewed with patient? Yes  SHORT TERM GOALS: Target date: 12/24/2021   I with basic HEP Baseline:  Goal status: Met LONG TERM GOALS;  Target Date: 01/10/22   1 I with final HEP   Baseline:   Goal status: initial  2.  Patient will improve cervical ROM to at least 80% of full ROM    Baseline:   Goals status: initial  3.  Increase FOTO score to at least 62   Baseline: 50   Goal status: initial    4.  Patient will complete all of his daily activities with pain score </=3/10    Baseline: up to 8/10    Goal status: 11/26/21 - Met   PLAN: PT FREQUENCY: 2x/week  PT DURATION: 10 weeks  PLANNED INTERVENTIONS: Therapeutic exercises, Therapeutic activity, Neuromuscular re-education, Balance training, Gait training, Patient/Family education, Joint mobilization, Dry Needling, Electrical stimulation, Cryotherapy, Moist heat, Vasopneumatic device, Traction, Ultrasound, Ionotophoresis 83m/ml Dexamethasone, and Manual therapy  PLAN FOR NEXT SESSION: Strengthening and ROM.  Crosley Stejskal, SPTA 11/26/2021, 8:49 AM

## 2021-11-27 NOTE — Therapy (Signed)
OUTPATIENT PHYSICAL THERAPY CERVICAL TREATMENT   Patient Name: Kevin [name] MRN: [id number] DOB:[birth year], 76 y.o., male Today's Date: 11/28/2021   PT End of Session - 11/28/21 0847     Visit Number 5    Date for PT Re-Evaluation 01/10/22    PT Start Time 0845    PT Stop Time 0929    PT Time Calculation (min) 44 min    Activity Tolerance Patient tolerated treatment well    Behavior During Therapy [facility] for tasks assessed/performed              Past Medical History:  Diagnosis Date   COPD (chronic obstructive pulmonary disease) ([place])    PFTs 01-2006 mild obstruction, (+) reponse to Bronchodilators     Hyperlipidemia    Hypertension    Shingles    Thorax, 2008   Thyroid disease    Past Surgical History:  Procedure Laterality Date   COLONOSCOPY  11/16/2008   [facility]   MULTIPLE TOOTH EXTRACTIONS     dentures upper and partial lower   NO PAST SURGERIES     Patient Active Problem List   Diagnosis Date Noted   Subclinical hypothyroidism 12/10/2016   PCP NOTES >>>>>>>>>>>>>>>>> 12/07/2015   Lower urinary tract symptoms (LUTS) 10/11/2014   Hyperglycemia 03/23/2014   Edema 10/20/2013   Skin lesion 10/20/2013   Annual physical exam 01/24/2011   Hyperlipidemia 03/23/2008   Hypertension 04/15/2007   COPD (chronic obstructive pulmonary disease) ([place]) 04/15/2007    PCP: Colon Branch  REFERRING PROVIDER: Pete Pelt, PA-C   REFERRING DIAG: M54.2 (ICD-10-CM) - Cervicalgia   THERAPY DIAG:  Muscle weakness (generalized)  Abnormal posture  Radiculopathy, cervical region  Other lack of coordination  Rationale for Evaluation and Treatment Rehabilitation  ONSET DATE: 10/22/2021   SUBJECTIVE:                                                                                                                                                                                                         SUBJECTIVE STATEMENT: Not having pain, neck feels good.   PERTINENT  HISTORY:  neck pain this been ongoing since around Easter. He denies any radicular symptoms down either arm. He states that his pain is worse with 6-7 out of 10 pain but not over the past week. He is having very minimal discomfort in his neck just feels stiff at this point time. He states that back in April he developed neck pain after catching a bag of concrete that he was lifting. He missed the handle. He has had  no treatment for this. At mostly having pain on the top of the shoulder and into the neck on the right side.   PAIN:  Are you having pain? No  PRECAUTIONS: None     LIVING ENVIRONMENT: Lives with: lives with their spouse Lives in: House/apartment No issues at home  OCCUPATION: retired, goes to [employer], but has had to cut back due to the pain.  PLOF: Independent  PATIENT GOALS Get rid of the pain.  OBJECTIVE:   DIAGNOSTIC FINDINGS:  Cervical spine films dated 08/27/2021 were personally reviewed and show no acute findings. Degenerative changes mostly involving C 3 through C6. Foraminal narrowing at multiple levels but most significant at C3-C4 and C4-C5.   POSTURE: rounded shoulders, forward head, and R shoulder held lower than L, R scapula lower than L, abducted and rotated up.  PALPATION: Patient with muscle tightness throughout B up traps, paraspinals-cerv, ant neck muscles, pects. TTP on Rside throughout.   CERVICAL ROM:   Active ROM A/PROM (deg) eval  Flexion 80 %  Extension 30  Right lateral flexion 30  Left lateral flexion 40  Right rotation 50  Left rotation 50   (Blank rows = not tested)  UPPER EXTREMITY ROM: Grossly WFL, painful to elevate arms above head or place hands behind head.   UPPER EXTREMITY MMT: BUE strength WNL   CERVICAL SPECIAL TESTS:  Spurling's test: Positive  TODAY'S TREATMENT:  11/28/21 UBE L3 x93mns  Passive stretching of UT  Cable rows 15# 2x10 Shoulder ext 15# 2x10 Horizontal abd redTB 2x10 Shoulder flexion 3# 2x10 Lateral  raises 3# 2x10 OHP 3# biltat 2x10 Seated rows 35# 2x10  MH cervical 12ms    11/26/21 UBE L3 x 6 min, 3 min forward/backwards Rows/lat pull down 35# 2x12 Power tower shoulder extension/rows 10# 2x10 each Tricep extension 25# 2x10 Shoulder abduction 3# 2x10 Self stretch of UT/pecs STM to UT/R rhomboid area  11/21/21 UBE L3 x 6 min, 3 min forward/backwards Rows/lat pull down 25# 2x10 Shoulder extension with green TB 2x12 B shoulder abduction/flexion 3# 2x10 Power tower shoulder extension/rows 10# 2x10 each Self stretch of UT 30 secs x2 Self strech of pecs 30 secs x2 STM to UT and right rhomboid area  11/16/21 NuStep L5 x 6 min  STM UT Rhomboid area TP in R rhomboid area  Rows & Lats 25lb 2x10  Shoulder Er green 2x10 Shoulder Ext green 2x10 W backs 2x10 Shoulder Flex 3lb 2x10 Shoulder Abd 2lb 2x10  Triceps Ext 25lb 2x15   Eval STM and stretch to neck and shoulders, patient education.   PATIENT EDUCATION:  Education details: HEP, stretching Person educated: Patient Education method: Explanation, Demonstration, and Handouts Education comprehension: verbalized understanding   HOME EXERCISE PROGRAM: [id number]  ASSESSMENT:  CLINICAL IMPRESSION: Patient is doing well, is not having pain in his neck. Still has some tightness in c-spine and upper traps but overall has made good improvements. We focused on strengthening exercises today and ended with moist heat to help with the stiffness.    OBJECTIVE IMPAIRMENTS decreased coordination, decreased mobility, decreased ROM, decreased strength, increased fascial restrictions, increased muscle spasms, impaired flexibility, impaired UE functional use, improper body mechanics, postural dysfunction, and pain.   ACTIVITY LIMITATIONS carrying, lifting, bending, and sleeping  PARTICIPATION LIMITATIONS: cleaning, laundry, driving, and yard work  PERSONAL FACTORS Age and Past/current experiences are also affecting patient's  functional outcome.   REHAB POTENTIAL: Good  CLINICAL DECISION MAKING: Evolving/moderate complexity  EVALUATION COMPLEXITY: Moderate   GOALS: Goals reviewed  with patient? Yes  SHORT TERM GOALS: Target date: 12/26/2021   I with basic HEP Baseline:  Goal status: Met LONG TERM GOALS;  Target Date: 01/10/22   1 I with final HEP   Baseline:   Goal status: initial  2.  Patient will improve cervical ROM to at least 80% of full ROM    Baseline:   Goals status: initial  3.  Increase FOTO score to at least 62   Baseline: 50   Goal status: initial    4.  Patient will complete all of his daily activities with pain score </=3/10    Baseline: up to 8/10    Goal status: 11/26/21 - Met   PLAN: PT FREQUENCY: 2x/week  PT DURATION: 10 weeks  PLANNED INTERVENTIONS: Therapeutic exercises, Therapeutic activity, Neuromuscular re-education, Balance training, Gait training, Patient/Family education, Joint mobilization, Dry Needling, Electrical stimulation, Cryotherapy, Moist heat, Vasopneumatic device, Traction, Ultrasound, Ionotophoresis 72m/ml Dexamethasone, and Manual therapy  PLAN FOR NEXT SESSION: Strengthening and ROM.  Arielle Ooms, SPTA 11/28/2021, 9:31 AM

## 2021-11-28 ENCOUNTER — Ambulatory Visit: Payer: PPO

## 2021-11-28 DIAGNOSIS — M5412 Radiculopathy, cervical region: Secondary | ICD-10-CM | POA: Diagnosis not present

## 2021-11-28 DIAGNOSIS — R278 Other lack of coordination: Secondary | ICD-10-CM

## 2021-11-28 DIAGNOSIS — R293 Abnormal posture: Secondary | ICD-10-CM

## 2021-11-28 DIAGNOSIS — M6281 Muscle weakness (generalized): Secondary | ICD-10-CM

## 2021-12-03 ENCOUNTER — Other Ambulatory Visit: Payer: Self-pay | Admitting: Internal Medicine

## 2021-12-03 ENCOUNTER — Ambulatory Visit: Payer: PPO

## 2021-12-05 ENCOUNTER — Encounter: Payer: Self-pay | Admitting: Physical Therapy

## 2021-12-05 ENCOUNTER — Ambulatory Visit: Payer: PPO | Admitting: Physical Therapy

## 2021-12-05 DIAGNOSIS — M5412 Radiculopathy, cervical region: Secondary | ICD-10-CM | POA: Diagnosis not present

## 2021-12-05 DIAGNOSIS — M6281 Muscle weakness (generalized): Secondary | ICD-10-CM

## 2021-12-05 DIAGNOSIS — R293 Abnormal posture: Secondary | ICD-10-CM

## 2021-12-05 NOTE — Therapy (Signed)
OUTPATIENT PHYSICAL THERAPY CERVICAL TREATMENT   Patient Name: Kevin [name] MRN: [id number] DOB:03-03-1946, 76 y.o., male Today's Date: 12/05/2021   PT End of Session - 12/05/21 0847     Visit Number 6    Date for PT Re-Evaluation 01/10/22    PT Start Time 0845    PT Stop Time 0930    PT Time Calculation (min) 45 min    Activity Tolerance Patient tolerated treatment well    Behavior During Therapy [facility] for tasks assessed/performed              Past Medical History:  Diagnosis Date   COPD (chronic obstructive pulmonary disease) ([place])    PFTs 01-2006 mild obstruction, (+) reponse to Bronchodilators     Hyperlipidemia    Hypertension    Shingles    Thorax, 2008   Thyroid disease    Past Surgical History:  Procedure Laterality Date   COLONOSCOPY  11/16/2008   [facility]   MULTIPLE TOOTH EXTRACTIONS     dentures upper and partial lower   NO PAST SURGERIES     Patient Active Problem List   Diagnosis Date Noted   Subclinical hypothyroidism 12/10/2016   PCP NOTES >>>>>>>>>>>>>>>>> 12/07/2015   Lower urinary tract symptoms (LUTS) 10/11/2014   Hyperglycemia 03/23/2014   Edema 10/20/2013   Skin lesion 10/20/2013   Annual physical exam 01/24/2011   Hyperlipidemia 03/23/2008   Hypertension 04/15/2007   COPD (chronic obstructive pulmonary disease) ([place]) 04/15/2007    PCP: Colon Branch  REFERRING PROVIDER: Pete Pelt, PA-C   REFERRING DIAG: M54.2 (ICD-10-CM) - Cervicalgia   THERAPY DIAG:  Muscle weakness (generalized)  Abnormal posture  Radiculopathy, cervical region  Rationale for Evaluation and Treatment Rehabilitation  ONSET DATE: 10/22/2021   SUBJECTIVE:                                                                                                                                                                                                         SUBJECTIVE STATEMENT: "Good, very good, no more pain, Ive been using heat, I think right now it is  arthritis in my neck"  PERTINENT HISTORY:  neck pain this been ongoing since around Easter. He denies any radicular symptoms down either arm. He states that his pain is worse with 6-7 out of 10 pain but not over the past week. He is having very minimal discomfort in his neck just feels stiff at this point time. He states that back in April he developed neck pain after catching a bag of concrete that he was  lifting. He missed the handle. He has had no treatment for this. At mostly having pain on the top of the shoulder and into the neck on the right side.   PAIN:  Are you having pain? No  PRECAUTIONS: None     LIVING ENVIRONMENT: Lives with: lives with their spouse Lives in: House/apartment No issues at home  OCCUPATION: retired, goes to [employer], but has had to cut back due to the pain.  PLOF: Independent  PATIENT GOALS Get rid of the pain.  OBJECTIVE:   DIAGNOSTIC FINDINGS:  Cervical spine films dated 08/27/2021 were personally reviewed and show no acute findings. Degenerative changes mostly involving C 3 through C6. Foraminal narrowing at multiple levels but most significant at C3-C4 and C4-C5.   POSTURE: rounded shoulders, forward head, and R shoulder held lower than L, R scapula lower than L, abducted and rotated up.  PALPATION: Patient with muscle tightness throughout B up traps, paraspinals-cerv, ant neck muscles, pects. TTP on Rside throughout.   CERVICAL ROM:   Active ROM A/PROM (deg) eval  Flexion 80 %  Extension 30  Right lateral flexion 30  Left lateral flexion 40  Right rotation 50  Left rotation 50   (Blank rows = not tested)  UPPER EXTREMITY ROM: Grossly WFL, painful to elevate arms above head or place hands behind head.   UPPER EXTREMITY MMT: BUE strength WNL   CERVICAL SPECIAL TESTS:  Spurling's test: Positive  TODAY'S TREATMENT:  12/05/21 UBE L3 x 6 min  Seated Rows and Lats 35lb 2x10 W Backs x10 x5  Shoulder Er green 2x10  Shoulder Ext bilat 3lb  WaTE 2x10 Overhead Ext facing wall 3lb WaTE 2x10 Cable rows 15lb 3x12 Horiz Abd green 2x10  11/28/21 UBE L3 x42mns  Passive stretching of UT  Cable rows 15# 2x10 Shoulder ext 15# 2x10 Horizontal abd redTB 2x10 Shoulder flexion 3# 2x10 Lateral raises 3# 2x10 OHP 3# biltat 2x10 Seated rows 35# 2x10  MH cervical 139ms    11/26/21 UBE L3 x 6 min, 3 min forward/backwards Rows/lat pull down 35# 2x12 Power tower shoulder extension/rows 10# 2x10 each Tricep extension 25# 2x10 Shoulder abduction 3# 2x10 Self stretch of UT/pecs STM to UT/R rhomboid area  11/21/21 UBE L3 x 6 min, 3 min forward/backwards Rows/lat pull down 25# 2x10 Shoulder extension with green TB 2x12 B shoulder abduction/flexion 3# 2x10 Power tower shoulder extension/rows 10# 2x10 each Self stretch of UT 30 secs x2 Self strech of pecs 30 secs x2 STM to UT and right rhomboid area  11/16/21 NuStep L5 x 6 min  STM UT Rhomboid area TP in R rhomboid area  Rows & Lats 25lb 2x10  Shoulder Er green 2x10 Shoulder Ext green 2x10 W backs 2x10 Shoulder Flex 3lb 2x10 Shoulder Abd 2lb 2x10  Triceps Ext 25lb 2x15   Eval STM and stretch to neck and shoulders, patient education.   PATIENT EDUCATION:  Education details: HEP, stretching Person educated: Patient Education method: Explanation, Demonstration, and Handouts Education comprehension: verbalized understanding   HOME EXERCISE PROGRAM: [id number]  ASSESSMENT:  CLINICAL IMPRESSION: Patient is doing well, is not having pain in his neck. He has forward rounded shoulders at rest. Interventions focuses on postural corrections and strength. No reports of increase pain during interventions    OBJECTIVE IMPAIRMENTS decreased coordination, decreased mobility, decreased ROM, decreased strength, increased fascial restrictions, increased muscle spasms, impaired flexibility, impaired UE functional use, improper body mechanics, postural dysfunction, and pain.   ACTIVITY  LIMITATIONS carrying, lifting, bending, and  sleeping  PARTICIPATION LIMITATIONS: cleaning, laundry, driving, and yard work  PERSONAL FACTORS Age and Past/current experiences are also affecting patient's functional outcome.   REHAB POTENTIAL: Good  CLINICAL DECISION MAKING: Evolving/moderate complexity  EVALUATION COMPLEXITY: Moderate   GOALS: Goals reviewed with patient? Yes  SHORT TERM GOALS: Target date: 01/02/2022   I with basic HEP Baseline:  Goal status: Met LONG TERM GOALS;  Target Date: 01/10/22   1 I with final HEP   Baseline:   Goal status: initial  2.  Patient will improve cervical ROM to at least 80% of full ROM    Baseline:   Goals status: initial  3.  Increase FOTO score to at least 62   Baseline: 50   Goal status: initial    4.  Patient will complete all of his daily activities with pain score </=3/10    Baseline: up to 8/10    Goal status: 11/26/21 - Met   PLAN: PT FREQUENCY: 2x/week  PT DURATION: 10 weeks  PLANNED INTERVENTIONS: Therapeutic exercises, Therapeutic activity, Neuromuscular re-education, Balance training, Gait training, Patient/Family education, Joint mobilization, Dry Needling, Electrical stimulation, Cryotherapy, Moist heat, Vasopneumatic device, Traction, Ultrasound, Ionotophoresis 75m/ml Dexamethasone, and Manual therapy  PLAN FOR NEXT SESSION: Strengthening and ROM.  AEmmons SPTA 12/05/2021, 8:47 AM

## 2021-12-27 DIAGNOSIS — H9011 Conductive hearing loss, unilateral, right ear, with unrestricted hearing on the contralateral side: Secondary | ICD-10-CM | POA: Diagnosis not present

## 2021-12-27 DIAGNOSIS — H6121 Impacted cerumen, right ear: Secondary | ICD-10-CM | POA: Diagnosis not present

## 2022-01-12 ENCOUNTER — Other Ambulatory Visit: Payer: Self-pay | Admitting: Internal Medicine

## 2022-01-28 ENCOUNTER — Ambulatory Visit: Payer: PPO

## 2022-01-29 ENCOUNTER — Ambulatory Visit (INDEPENDENT_AMBULATORY_CARE_PROVIDER_SITE_OTHER): Payer: PPO | Admitting: *Deleted

## 2022-01-29 DIAGNOSIS — Z0000 Encounter for general adult medical examination without abnormal findings: Secondary | ICD-10-CM | POA: Diagnosis not present

## 2022-01-29 NOTE — Patient Instructions (Signed)
Kevin [name] , Thank you for taking time to come for your Medicare Wellness Visit. I appreciate your ongoing commitment to your health goals. Please review the following plan we discussed and let me know if I can assist you in the future.   These are the goals we discussed:  Goals       A1c goal less than 6.5%      -Pre-Diabetes a1c range is 5.7% to 6.4%. Your most recent a1c was 5.7% on 12/18/2018.  -Usually the full diabetes diagnosis is given when a1c reaches 6.5% or higher.        Blood pressure goal less than 140/90      Check blood pressure 1 to 3 times per week and record readings      Maintain current lifestyle (pt-stated)        This is a list of the screening recommended for you and due dates:  Health Maintenance  Topic Date Due   COVID-19 Vaccine (5 - Pfizer series) 11/07/2020   Flu Shot  12/11/2021   Colon Cancer Screening  01/24/2022   Tetanus Vaccine  05/12/2029   Pneumonia Vaccine  Completed   Hepatitis C Screening: USPSTF Recommendation to screen - Ages 18-79 yo.  Completed   Zoster (Shingles) Vaccine  Completed   HPV Vaccine  Aged Out      Next appointment: Follow up in one year for your annual wellness visit. 01/31/23 11:00am  Preventive Care 65 Years and Older, Male Preventive care refers to lifestyle choices and visits with your health care provider that can promote health and wellness. What does preventive care include? A yearly physical exam. This is also called an annual well check. Dental exams once or twice a year. Routine eye exams. Ask your health care provider how often you should have your eyes checked. Personal lifestyle choices, including: Daily care of your teeth and gums. Regular physical activity. Eating a healthy diet. Avoiding tobacco and drug use. Limiting alcohol use. Practicing safe sex. Taking low doses of aspirin every day. Taking vitamin and mineral supplements as recommended by your health care provider. What happens during an  annual well check? The services and screenings done by your health care provider during your annual well check will depend on your age, overall health, lifestyle risk factors, and family history of disease. Counseling  Your health care provider may ask you questions about your: Alcohol use. Tobacco use. Drug use. Emotional well-being. Home and relationship well-being. Sexual activity. Eating habits. History of falls. Memory and ability to understand (cognition). Work and work Statistician. Screening  You may have the following tests or measurements: Height, weight, and BMI. Blood pressure. Lipid and cholesterol levels. These may be checked every 5 years, or more frequently if you are over 72 years old. Skin check. Lung cancer screening. You may have this screening every year starting at age 29 if you have a 30-pack-year history of smoking and currently smoke or have quit within the past 15 years. Fecal occult blood test (FOBT) of the stool. You may have this test every year starting at age 34. Flexible sigmoidoscopy or colonoscopy. You may have a sigmoidoscopy every 5 years or a colonoscopy every 10 years starting at age 12. Prostate cancer screening. Recommendations will vary depending on your family history and other risks. Hepatitis C blood test. Hepatitis B blood test. Sexually transmitted disease (STD) testing. Diabetes screening. This is done by checking your blood sugar (glucose) after you have not eaten for a while (fasting).  You may have this done every 1-3 years. Abdominal aortic aneurysm (AAA) screening. You may need this if you are a current or former smoker. Osteoporosis. You may be screened starting at age 77 if you are at high risk. Talk with your health care provider about your test results, treatment options, and if necessary, the need for more tests. Vaccines  Your health care provider may recommend certain vaccines, such as: Influenza vaccine. This is recommended  every year. Tetanus, diphtheria, and acellular pertussis (Tdap, Td) vaccine. You may need a Td booster every 10 years. Zoster vaccine. You may need this after age 76. Pneumococcal 13-valent conjugate (PCV13) vaccine. One dose is recommended after age 9. Pneumococcal polysaccharide (PPSV23) vaccine. One dose is recommended after age 100. Talk to your health care provider about which screenings and vaccines you need and how often you need them. This information is not intended to replace advice given to you by your health care provider. Make sure you discuss any questions you have with your health care provider. Document Released: 05/26/2015 Document Revised: 01/17/2016 Document Reviewed: 02/28/2015 Elsevier Interactive Patient Education  2017 [place] Prevention in the Home Falls can cause injuries. They can happen to people of all ages. There are many things you can do to make your home safe and to help prevent falls. What can I do on the outside of my home? Regularly fix the edges of walkways and driveways and fix any cracks. Remove anything that might make you trip as you walk through a door, such as a raised step or threshold. Trim any bushes or trees on the path to your home. Use bright outdoor lighting. Clear any walking paths of anything that might make someone trip, such as rocks or tools. Regularly check to see if handrails are loose or broken. Make sure that both sides of any steps have handrails. Any raised decks and porches should have guardrails on the edges. Have any leaves, snow, or ice cleared regularly. Use sand or salt on walking paths during winter. Clean up any spills in your garage right away. This includes oil or grease spills. What can I do in the bathroom? Use night lights. Install grab bars by the toilet and in the tub and shower. Do not use towel bars as grab bars. Use non-skid mats or decals in the tub or shower. If you need to sit down in the shower,  use a plastic, non-slip stool. Keep the floor dry. Clean up any water that spills on the floor as soon as it happens. Remove soap buildup in the tub or shower regularly. Attach bath mats securely with double-sided non-slip rug tape. Do not have throw rugs and other things on the floor that can make you trip. What can I do in the bedroom? Use night lights. Make sure that you have a light by your bed that is easy to reach. Do not use any sheets or blankets that are too big for your bed. They should not hang down onto the floor. Have a firm chair that has side arms. You can use this for support while you get dressed. Do not have throw rugs and other things on the floor that can make you trip. What can I do in the kitchen? Clean up any spills right away. Avoid walking on wet floors. Keep items that you use a lot in easy-to-reach places. If you need to reach something above you, use a strong step stool that has a grab bar. Keep  electrical cords out of the way. Do not use floor polish or wax that makes floors slippery. If you must use wax, use non-skid floor wax. Do not have throw rugs and other things on the floor that can make you trip. What can I do with my stairs? Do not leave any items on the stairs. Make sure that there are handrails on both sides of the stairs and use them. Fix handrails that are broken or loose. Make sure that handrails are as long as the stairways. Check any carpeting to make sure that it is firmly attached to the stairs. Fix any carpet that is loose or worn. Avoid having throw rugs at the top or bottom of the stairs. If you do have throw rugs, attach them to the floor with carpet tape. Make sure that you have a light switch at the top of the stairs and the bottom of the stairs. If you do not have them, ask someone to add them for you. What else can I do to help prevent falls? Wear shoes that: Do not have high heels. Have rubber bottoms. Are comfortable and fit you  well. Are closed at the toe. Do not wear sandals. If you use a stepladder: Make sure that it is fully opened. Do not climb a closed stepladder. Make sure that both sides of the stepladder are locked into place. Ask someone to hold it for you, if possible. Clearly mark and make sure that you can see: Any grab bars or handrails. First and last steps. Where the edge of each step is. Use tools that help you move around (mobility aids) if they are needed. These include: Canes. Walkers. Scooters. Crutches. Turn on the lights when you go into a dark area. Replace any light bulbs as soon as they burn out. Set up your furniture so you have a clear path. Avoid moving your furniture around. If any of your floors are uneven, fix them. If there are any pets around you, be aware of where they are. Review your medicines with your doctor. Some medicines can make you feel dizzy. This can increase your chance of falling. Ask your doctor what other things that you can do to help prevent falls. This information is not intended to replace advice given to you by your health care provider. Make sure you discuss any questions you have with your health care provider. Document Released: 02/23/2009 Document Revised: 10/05/2015 Document Reviewed: 06/03/2014 Elsevier Interactive Patient Education  2017 [employer].

## 2022-01-29 NOTE — Progress Notes (Cosign Needed)
Subjective:   Kevin [name] is a 76 y.o. male who presents for Medicare Annual/Subsequent preventive examination.  I connected with  Kevin [name] on 01/29/22 by an audio enabled telemedicine application and verified that I am speaking with the correct person using two identifiers.  Patient Location: Home  Provider Location: Office/Clinic  I discussed the limitations of evaluation and management by telemedicine. The patient expressed understanding and agreed to proceed.   Review of Systems    Defer to PCP Cardiac Risk Factors include: advanced age (>62mn, >>65women);dyslipidemia;hypertension;male gender     Objective:    There were no vitals filed for this visit. There is no height or weight on file to calculate BMI.     01/29/2022   11:02 AM 01/27/2021   12:08 PM 12/18/2018    9:45 AM 12/12/2017    8:14 AM 12/10/2016    8:13 AM  Advanced Directives  Does Patient Have a Medical Advance Directive? No No No No No  Would patient like information on creating a medical advance directive? No - Patient declined No - Patient declined No - Patient declined No - Patient declined Yes (MAU/Ambulatory/Procedural Areas - Information given)    Current Medications (verified) Outpatient Encounter Medications as of 01/29/2022  Medication Sig   acetaminophen (TYLENOL) 500 MG tablet Take 500 mg by mouth every 6 (six) hours as needed.   albuterol (VENTOLIN HFA) 108 (90 Base) MCG/ACT inhaler Inhale 2 puffs into the lungs every 6 (six) hours as needed for wheezing or shortness of breath. (Patient not taking: Reported on 10/17/2021)   aspirin EC 81 MG tablet Take 81 mg by mouth daily. Swallow whole.   calcium carbonate (OS-CAL) 600 MG TABS tablet Take 600 mg by mouth at bedtime.    Cyanocobalamin (VITAMIN B 12 PO) Take 1,000 mcg by mouth 3 (three) times a week.   Krill Oil 350 MG CAPS Take 1 capsule by mouth daily.   levothyroxine (SYNTHROID) 88 MCG tablet Take 1 tablet (88 mcg total) by mouth daily  before breakfast.   metoprolol succinate (TOPROL-XL) 50 MG 24 hr tablet TAKE 1 TABLET BY MOUTH EVERY DAY   Multiple Vitamins-Minerals (MULTIVITAMIN PO) Take 1 tablet by mouth daily. In the evening   simvastatin (ZOCOR) 40 MG tablet TAKE 1 TABLET BY MOUTH EVERYDAY AT BEDTIME   Turmeric 500 MG CAPS Take 500 mg by mouth daily.   Zinc 50 MG TABS Take 50 mg by mouth daily.   No facility-administered encounter medications on file as of 01/29/2022.    Allergies (verified) Tiotropium bromide monohydrate and Bromide ion [bromine]   History: Past Medical History:  Diagnosis Date   [place]    PFTs 01-2006 mild obstruction, (+) reponse to Bronchodilators     Hyperlipidemia    Hypertension    Shingles    Thorax, 2008   Thyroid disease    Past Surgical History:  Procedure Laterality Date   COLONOSCOPY  11/16/2008   PSharlett Iles  MULTIPLE TOOTH EXTRACTIONS     dentures upper and partial lower   NO PAST SURGERIES     Family History  Problem Relation Age of Onset   CAD Father        F MI ~ 441y/o   Hypertension Mother        M and S   Colon cancer Neg Hx    Prostate cancer Neg Hx    Diabetes Neg Hx    Rectal cancer Neg  Hx    Stomach cancer Neg Hx    Social History   Socioeconomic History   Marital status: Married    Spouse name: Not on file   Number of children: 3   Years of education: Not on file   Highest education level: Not on file  Occupational History   Occupation: retired 01-2013    Employer: [employer]  Tobacco Use   Smoking status: Former    Types: Cigarettes    Start date: 1960    Quit date: 05/13/2004    Years since quitting: 17.7   Smokeless tobacco: Never   Tobacco comments:    used to smoke 2 ppd   Vaping Use   Vaping Use: Never used  Substance and Sexual Activity   Alcohol use: Yes    Alcohol/week: 7.0 - 14.0 standard drinks of alcohol    Types: 7 - 14 Cans of beer per week    Comment: 2-3  beers some days   Drug use:  No   Sexual activity: Not Currently  Other Topics Concern   Not on file  Social History Narrative   Lives w/ wife   3 children   Daughter lives in [place], Son in [place] Determinants of Health   Financial Resource Strain: Medium Risk (04/18/2021)   Overall Financial Resource Strain (CARDIA)    Difficulty of Paying Living Expenses: Somewhat hard  Food Insecurity: Food Insecurity Present (01/27/2021)   Hunger Vital Sign    Worried About Running Out of Food in the Last Year: Sometimes true    Ran Out of Food in the Last Year: Sometimes true  Transportation Needs: No Transportation Needs (01/27/2021)   PRAPARE - Hydrologist (Medical): No    Lack of Transportation (Non-Medical): No  Physical Activity: Insufficiently Active (10/17/2021)   Exercise Vital Sign    Days of Exercise per Week: 1 day    Minutes of Exercise per Session: 70 min  Stress: No Stress Concern Present (01/27/2021)   [place]    Feeling of Stress : Not at all  Social Connections: [place] (01/27/2021)   Social Connection and Isolation Panel [NHANES]    Frequency of Communication with Friends and Family: More than three times a week    Frequency of Social Gatherings with Friends and Family: Twice a week    Attends Religious Services: More than 4 times per year    Active Member of [employer] or Organizations: Yes    Attends Music therapist: More than 4 times per year    Marital Status: Married    Tobacco Counseling Counseling given: Not Answered Tobacco comments: used to smoke 2 ppd    Clinical Intake:  Pre-visit preparation completed: Yes  Pain : No/denies pain     Nutritional Risks: None Diabetes: No  How often do you need to have someone help you when you read instructions, pamphlets, or other written materials from your doctor or pharmacy?: 1 - Never  Diabetic? No   Activities of Daily  Living    01/29/2022   11:03 AM  In your present state of health, do you have any difficulty performing the following activities:  Hearing? 0  Vision? 1  Comment getting new glasses soon  Difficulty concentrating or making decisions? 1  Comment slight difficulty remembering things  Walking or climbing stairs? 0  Dressing or bathing? 0  Doing errands, shopping? 0  Preparing Food and  eating ? N  Using the Toilet? N  In the past six months, have you accidently leaked urine? N  Do you have problems with loss of bowel control? N  Managing your Medications? N  Managing your Finances? N  Housekeeping or managing your Housekeeping? N    Patient Care Team: Colon Branch, MD as PCP - General Danis, Kirke Corin, MD as Consulting Physician (Gastroenterology) [place], Jennefer Bravo, MD as Referring Physician (Dermatology)  Indicate any recent Medical Services you may have received from other than [facility] providers in the past year (date may be approximate).     Assessment:   This is a routine wellness examination for Lorenzo.  Hearing/Vision screen No results found.  Dietary issues and exercise activities discussed: Current Exercise Habits: Home exercise routine, Type of exercise: treadmill, Time (Minutes): 20, Frequency (Times/Week): 1, Weekly Exercise (Minutes/Week): 20, Intensity: Mild, Exercise limited by: None identified   Goals Addressed               This Visit's Progress     Maintain current lifestyle (pt-stated)   On track      Depression Screen    01/29/2022   11:02 AM 08/27/2021   10:05 AM 03/02/2021   10:09 AM 01/27/2021   12:09 PM 03/01/2020    2:42 PM 12/18/2018    9:45 AM 12/18/2018    8:55 AM  PHQ 2/9 Scores  PHQ - 2 Score 0 0 0 0 0 0 0    Fall Risk    01/29/2022   11:02 AM 08/27/2021   10:05 AM 03/02/2021   10:10 AM 01/27/2021   12:14 PM 08/31/2020    9:18 AM  [place] in the past year? 0 0 0 0 0  Number falls in past yr: 0 0 0 0 0  Injury with  Fall? 0 0 0 0 0  Risk for fall due to : No Fall Risks   No Fall Risks   Follow up Falls evaluation completed  Falls evaluation completed Falls prevention discussed Falls evaluation completed    [place]:  Any stairs in or around the home? Yes  If so, are there any without handrails? No  Home free of loose throw rugs in walkways, pet beds, electrical cords, etc? Yes  Adequate lighting in your home to reduce risk of falls? Yes   ASSISTIVE DEVICES UTILIZED TO PREVENT FALLS:  Life alert? No  Use of a cane, walker or w/c? No  Grab bars in the bathroom? No  Shower chair or bench in shower? No  Elevated toilet seat or a handicapped toilet? No    Cognitive Function:    12/10/2016    8:17 AM  MMSE - Mini Mental State Exam  Orientation to time 5  Orientation to Place 5  Registration 3  Attention/ Calculation 4  Recall 2  Language- name 2 objects 2  Language- repeat 1  Language- follow 3 step command 3  Language- read & follow direction 1  Write a sentence 1  Copy design 1  Total score 28        01/29/2022   11:07 AM  6CIT Screen  What Year? 0 points  What month? 0 points  What time? 0 points  Count back from 20 0 points  Months in reverse 2 points  Repeat phrase 0 points  Total Score 2 points    Immunizations Immunization History  Administered Date(s) Administered   Fluad  Quad(high Dose 65+) 01/15/2019   Influenza Split 05/22/2012   Influenza, High Dose Seasonal PF 02/10/2013, 03/11/2016, 01/29/2020, 01/22/2021   Influenza,inj,Quad PF,6+ Mos 02/21/2014   Influenza-Unspecified 02/11/2017, 02/10/2018   PFIZER Comirnaty(Gray Top)Covid-19 Tri-Sucrose Vaccine 09/12/2020   PFIZER(Purple Top)SARS-COV-2 Vaccination 06/03/2019, 06/24/2019, 04/11/2020   Pneumococcal Conjugate-13 03/23/2014   Pneumococcal Polysaccharide-23 04/15/2007, 04/20/2013   Td 05/13/1998, 08/01/2008   Tdap 05/13/2019   Zoster Recombinat (Shingrix) 01/15/2019,  04/16/2019   Zoster, Live 09/14/2013    TDAP status: Up to date  Flu Vaccine status: Due, Education has been provided regarding the importance of this vaccine. Advised may receive this vaccine at local pharmacy or Health Dept. Aware to provide a copy of the vaccination record if obtained from local pharmacy or Health Dept. Verbalized acceptance and understanding.  Pneumococcal vaccine status: Up to date  Covid-19 vaccine status: Information provided on how to obtain vaccines.   Qualifies for Shingles Vaccine? Yes   Zostavax completed Yes   Shingrix Completed?: Yes  Screening Tests Health Maintenance  Topic Date Due   COVID-19 Vaccine (5 - Pfizer series) 11/07/2020   INFLUENZA VACCINE  12/11/2021   COLONOSCOPY (Pts 45-75yr Insurance coverage will need to be confirmed)  01/24/2022   TETANUS/TDAP  05/12/2029   Pneumonia Vaccine 78 Years old  Completed   Hepatitis C Screening  Completed   Zoster Vaccines- Shingrix  Completed   HPV VACCINES  Aged Out    Health Maintenance  Health Maintenance Due  Topic Date Due   COVID-19 Vaccine (5 - Pfizer series) 11/07/2020   INFLUENZA VACCINE  12/11/2021   COLONOSCOPY (Pts 45-451yrInsurance coverage will need to be confirmed)  01/24/2022    Colorectal cancer screening: Type of screening: Colonoscopy. Completed 01/25/19. Repeat every 3 years  Lung Cancer Screening: (Low Dose CT Chest recommended if Age 78-80ears, 30 pack-year currently smoking OR have quit w/in 15years.) does not qualify.   Lung Cancer Screening Referral: N/a  Additional Screening:  Hepatitis C Screening: does qualify; Completed 12/12/17  Vision Screening: Recommended annual ophthalmology exams for early detection of glaucoma and other disorders of the eye. Is the patient up to date with their annual eye exam?  Yes  Who is the provider or what is the name of the office in which the patient attends annual eye exams? [place] pt is not established with a  provider, would they like to be referred to a provider to establish care? No .   Dental Screening: Recommended annual dental exams for proper oral hygiene  Community Resource Referral / Chronic Care Management: CRR required this visit?  No   CCM required this visit?  No      Plan:     I have personally reviewed and noted the following in the patient's chart:   Medical and social history Use of alcohol, tobacco or illicit drugs  Current medications and supplements including opioid prescriptions. Patient is not currently taking opioid prescriptions. Functional ability and status Nutritional status Physical activity Advanced directives List of other physicians Hospitalizations, surgeries, and ER visits in previous 12 months Vitals Screenings to include cognitive, depression, and falls Referrals and appointments  In addition, I have reviewed and discussed with patient certain preventive protocols, quality metrics, and best practice recommendations. A written personalized care plan for preventive services as well as general preventive health recommendations were provided to patient.   Due to this being a telephonic visit, the after visit summary with patients personalized plan was offered to patient via  mail or my-chart. Patient would like to access on my-chart.  Beatris Ship, [place]   01/29/2022   Nurse Notes: None  I have reviewed and agree with [facility] Co[facility] documentation.  Kathlene November, MD

## 2022-01-30 ENCOUNTER — Encounter: Payer: Self-pay | Admitting: Gastroenterology

## 2022-03-11 ENCOUNTER — Encounter: Payer: Self-pay | Admitting: Gastroenterology

## 2022-03-14 ENCOUNTER — Ambulatory Visit: Payer: PPO | Admitting: Physician Assistant

## 2022-03-20 ENCOUNTER — Encounter: Payer: Self-pay | Admitting: Internal Medicine

## 2022-03-20 ENCOUNTER — Ambulatory Visit (INDEPENDENT_AMBULATORY_CARE_PROVIDER_SITE_OTHER): Payer: PPO | Admitting: Internal Medicine

## 2022-03-20 VITALS — BP 116/82 | HR 66 | Temp 97.6°F | Resp 16 | Ht 71.0 in | Wt 177.5 lb

## 2022-03-20 DIAGNOSIS — I10 Essential (primary) hypertension: Secondary | ICD-10-CM

## 2022-03-20 DIAGNOSIS — Z0000 Encounter for general adult medical examination without abnormal findings: Secondary | ICD-10-CM | POA: Diagnosis not present

## 2022-03-20 DIAGNOSIS — E782 Mixed hyperlipidemia: Secondary | ICD-10-CM | POA: Diagnosis not present

## 2022-03-20 DIAGNOSIS — E039 Hypothyroidism, unspecified: Secondary | ICD-10-CM

## 2022-03-20 LAB — COMPREHENSIVE METABOLIC PANEL
ALT: 15 U/L (ref 0–53)
AST: 17 U/L (ref 0–37)
Albumin: 4.3 g/dL (ref 3.5–5.2)
Alkaline Phosphatase: 53 U/L (ref 39–117)
BUN: 18 mg/dL (ref 6–23)
CO2: 33 mEq/L — ABNORMAL HIGH (ref 19–32)
Calcium: 9.4 mg/dL (ref 8.4–10.5)
Chloride: 103 mEq/L (ref 96–112)
Creatinine, Ser: 0.99 mg/dL (ref 0.40–1.50)
GFR: 73.01 mL/min (ref >60.00)
Glucose, Bld: 93 mg/dL (ref 70–99)
Potassium: 5 mEq/L (ref 3.5–5.1)
Sodium: 140 mEq/L (ref 135–145)
Total Bilirubin: 0.5 mg/dL (ref 0.2–1.2)
Total Protein: 6.8 g/dL (ref 6.0–8.3)

## 2022-03-20 LAB — CBC WITH DIFFERENTIAL/PLATELET
Basophils Absolute: 0 10*3/uL (ref 0.0–0.1)
Basophils Relative: 0.5 % (ref 0.0–3.0)
Eosinophils Absolute: 0.1 10*3/uL (ref 0.0–0.7)
Eosinophils Relative: 1.6 % (ref 0.0–5.0)
HCT: 39.4 % (ref 39.0–52.0)
Hemoglobin: 13.1 g/dL (ref 13.0–17.0)
Lymphocytes Relative: 40.7 % (ref 12.0–46.0)
Lymphs Abs: 1.8 10*3/uL (ref 0.7–4.0)
MCHC: 33.4 g/dL (ref 30.0–36.0)
MCV: 93.1 fl (ref 78.0–100.0)
Monocytes Absolute: 0.5 10*3/uL (ref 0.1–1.0)
Monocytes Relative: 11 % (ref 3.0–12.0)
Neutro Abs: 2.1 10*3/uL (ref 1.4–7.7)
Neutrophils Relative %: 46.2 % (ref 43.0–77.0)
Platelets: 242 10*3/uL (ref 150.0–400.0)
RBC: 4.23 Mil/uL (ref 4.22–5.81)
RDW: 13.1 % (ref 11.5–15.5)
WBC: 4.5 10*3/uL (ref 4.0–10.5)

## 2022-03-20 LAB — LIPID PANEL
Cholesterol: 135 mg/dL (ref 0–200)
HDL: 43.1 mg/dL (ref >39.00)
LDL Cholesterol: 67 mg/dL (ref 0–99)
NonHDL: 92.09
Total CHOL/HDL Ratio: 3
Triglycerides: 123 mg/dL (ref 0.0–149.0)
VLDL: 24.6 mg/dL (ref 0.0–40.0)

## 2022-03-20 LAB — TSH: TSH: 0.88 u[IU]/mL (ref 0.35–5.50)

## 2022-03-20 NOTE — Progress Notes (Unsigned)
Subjective:    Patient ID: Kevin [name], male    DOB: [birth year], 76 y.o.   MRN: [id number]  DOS:  03/20/2022 Type of visit - description: cpx  Here for CPX. In general feels well. He has chronic cough, at baseline;  no sputum production Had neck pain, see previous visits: Resolved  Review of Systems  Other than above, a 14 point review of systems is negative     Past Medical History:  Diagnosis Date   COPD (chronic obstructive pulmonary disease) ([place])    PFTs 01-2006 mild obstruction, (+) reponse to Bronchodilators     Hyperlipidemia    Hypertension    Shingles    Thorax, 2008   Thyroid disease     Past Surgical History:  Procedure Laterality Date   COLONOSCOPY  11/16/2008   Sharlett Iles   MULTIPLE TOOTH EXTRACTIONS     dentures upper and partial lower   NO PAST SURGERIES     Social History   Socioeconomic History   Marital status: Married    Spouse name: Not on file   Number of children: 3   Years of education: Not on file   Highest education level: Not on file  Occupational History   Occupation: retired 01-2013    Employer: [employer]  Tobacco Use   Smoking status: Former    Types: Cigarettes    Start date: 1960    Quit date: 05/13/2004    Years since quitting: 17.8   Smokeless tobacco: Never   Tobacco comments:    used to smoke 2 ppd   Vaping Use   Vaping Use: Never used  Substance and Sexual Activity   Alcohol use: Yes    Alcohol/week: 7.0 - 14.0 standard drinks of alcohol    Types: 7 - 14 Cans of beer per week    Comment: 2-3  beers some days, stopped 02-2022   Drug use: No   Sexual activity: Not Currently  Other Topics Concern   Not on file  Social History Narrative   Lives w/ wife   3 children   Daughter lives in [place], Son in [place] Determinants of Health   Financial Resource Strain: Medium Risk (04/18/2021)   Overall Financial Resource Strain (CARDIA)    Difficulty of Paying Living Expenses: Somewhat hard  Food Insecurity: Food Insecurity  Present (01/27/2021)   Hunger Vital Sign    Worried About Running Out of Food in the Last Year: Sometimes true    Ran Out of Food in the Last Year: Sometimes true  Transportation Needs: No Transportation Needs (01/27/2021)   PRAPARE - Hydrologist (Medical): No    Lack of Transportation (Non-Medical): No  Physical Activity: Insufficiently Active (10/17/2021)   Exercise Vital Sign    Days of Exercise per Week: 1 day    Minutes of Exercise per Session: 70 min  Stress: No Stress Concern Present (01/27/2021)   [place]    Feeling of Stress : Not at all  Social Connections: [place] (01/27/2021)   Social Connection and Isolation Panel [NHANES]    Frequency of Communication with Friends and Family: More than three times a week    Frequency of Social Gatherings with Friends and Family: Twice a week    Attends Religious Services: More than 4 times per year    Active Member of [employer] or Organizations: Yes    Attends Archivist Meetings: More than  4 times per year    Marital Status: Married  Human resources officer Violence: Not At Risk (01/27/2021)   Humiliation, Afraid, Rape, and Kick questionnaire    Fear of Current or Ex-Partner: No    Emotionally Abused: No    Physically Abused: No    Sexually Abused: No    Current Outpatient Medications  Medication Instructions   acetaminophen (TYLENOL) 500 mg, Oral, Every 6 hours PRN   albuterol (VENTOLIN HFA) 108 (90 Base) MCG/ACT inhaler 2 puffs, Inhalation, Every 6 hours PRN   aspirin EC 81 mg, Oral, Daily, Swallow whole.   calcium carbonate (OS-CAL) 600 mg, Oral, Daily at bedtime   Cyanocobalamin (VITAMIN B 12 PO) 1,000 mcg, Oral, 3 times weekly   Krill Oil 350 MG CAPS 1 capsule, Oral, Daily   levothyroxine (SYNTHROID) 88 mcg, Oral, Daily before breakfast   metoprolol succinate (TOPROL-XL) 50 MG 24 hr tablet TAKE 1 TABLET BY MOUTH EVERY DAY    Multiple Vitamins-Minerals (MULTIVITAMIN PO) 1 tablet, Oral, Daily, In the evening   simvastatin (ZOCOR) 40 MG tablet TAKE 1 TABLET BY MOUTH EVERYDAY AT BEDTIME   Turmeric 500 mg, Oral, Daily   Zinc 50 mg, Oral, Daily       Objective:   Physical Exam BP 116/82   Pulse 66   Temp 97.6 F (36.4 C) (Oral)   Resp 16   Ht '5\' 11"'$  (1.803 m)   Wt 177 lb 8 oz (80.5 kg)   SpO2 96%   BMI 24.76 kg/m  General: Well developed, NAD, BMI noted Neck: No  thyromegaly  HEENT:  Normocephalic . Face symmetric, atraumatic Lungs:  CTA B Normal respiratory effort, no intercostal retractions, no accessory muscle use. Heart: RRR,  no murmur.  Abdomen:  Not distended, soft, non-tender. No rebound or rigidity.   Lower extremities: no pretibial edema bilaterally  Skin: Exposed areas without rash. Not pale. Not jaundice Neurologic:  alert & oriented X3.  Speech normal, gait appropriate for age and unassisted Strength symmetric and appropriate for age.  Psych: Cognition and judgment appear intact.  Cooperative with normal attention span and concentration.  Behavior appropriate. No anxious or depressed appearing.     Assessment     Assessment Hyperglycemia HTN Hypothyroidism Hyperlipidemia LUTS COPD: PFTs 2007 mild obstruction, + response to bronchodilators, h/o multiple inhalers intolerance  Shingles 2008, chest. CAD per CT 2020 (continue aspirin)  PLAN Here for CPX Hyperglycemia: A1c has been stable.  Recheck on RTC HTN: BP today is very good, encouraged to check at home, continue metoprolol.  Checking labs Hyperlipidemia: On simvastatin, checking labs COPD: Stable, on inhalers as needed, declined several vaccines. CAD per CT: No symptoms, continue aspirin Neck pain: Had PT, resolved. RTC 6 to 8 months.

## 2022-03-20 NOTE — Patient Instructions (Addendum)
Check the  blood pressure regularly BP GOAL is between 110/65 and  135/85. If it is consistently higher or lower, let me know      GO TO THE LAB : Get the blood work     [place], [place] back for a checkup in 6 to 8 months    Do you have a "Living will" or "[place] of attorney"? (Advance care planning documents)  If you already have a living will or healthcare power of attorney, is recommended you bring the copy to be scanned in your chart. The document will be available to all the doctors you see in the system.  If you don't have one, please consider create one.  Advance care planning is a process that supports adults in  understanding and sharing their preferences regarding future medical care.   The patient's preferences are recorded in documents called Advance Directives and the can be modified at any time while the patient is in full mental capacity.   The documentation should be available at all times to the patient, the family and the healthcare providers.   This legal documents direct the family or surrogate to make the decision if the patient is not capable to do so.    Advance directives can be documented in many types of formats,  documents have names such as:  Lliving will  Durable power of attorney for healthcare (healthcare proxy or healthcare power of attorney)  Combined directives  Physician orders for life-sustaining treatment    More information at:  [email or web address]

## 2022-03-21 ENCOUNTER — Encounter: Payer: Self-pay | Admitting: Internal Medicine

## 2022-03-21 NOTE — Assessment & Plan Note (Signed)
-  Td:2020 - pneumonia shot 12-08 , 2014;  prevnar 03-2014.  PNM 20: Declined   -  zostavax :2015; s/p shingrex. -RSV declined - Covid booster declined  -Benefits of vaccines discussed. -had flu shot -Cscope x 2  (Dr Inocente Salles) , Cscope 11-2008 negative, colonoscopy 01-2019.  Next scheduled for 04-2022    -Prostate cancer screening: See previous entries, no further screening -Labs: CMP, FLP, CBC, TSH -Lung cancer screening: Patient is 58, quit tobacco 2006. Elected no further CTs -Lifestyle discussed -POA discussed

## 2022-03-21 NOTE — Assessment & Plan Note (Signed)
Here for CPX Hyperglycemia: A1c has been stable.  Recheck on RTC HTN: BP today is very good, encouraged to check at home, continue metoprolol.  Checking labs Hyperlipidemia: On simvastatin, checking labs COPD: Stable, on inhalers as needed, declined several vaccines. CAD per CT: No symptoms, continue aspirin Neck pain: Had PT, resolved. RTC 6 to 8 months.

## 2022-03-25 ENCOUNTER — Ambulatory Visit (AMBULATORY_SURGERY_CENTER): Payer: Self-pay

## 2022-03-25 VITALS — Ht 71.0 in | Wt 180.0 lb

## 2022-03-25 DIAGNOSIS — Z86010 Personal history of colonic polyps: Secondary | ICD-10-CM

## 2022-03-25 MED ORDER — NA SULFATE-K SULFATE-MG SULF 17.5-3.13-1.6 GM/177ML PO SOLN: 1.0000 | Freq: Once | ORAL | 0 refills | Status: AC

## 2022-03-25 NOTE — Progress Notes (Signed)
No egg or soy allergy known to patient;  No issues known to pt with past sedation with any surgeries or procedures; Patient denies ever being told they had issues or difficulty with intubation;  No FH of Malignant Hyperthermia; Pt is not on diet pills; Pt is not on home 02;  Pt is not on blood thinners;  Pt denies issues with constipation;  No A fib or A flutter; Have any cardiac testing pending--NO Pt instructed to use [email or web address] or GoodRx for a price reduction on prep   Insurance verified during [place] appt=HT[employer] Medicare   Patient's chart reviewed by Osvaldo Angst CNRA prior to previsit and patient appropriate for the [place].  Previsit completed and red dot placed by patient's name on their procedure day (on provider's schedule).    GoodRx coupon given to patient during [facility] appt;

## 2022-04-10 ENCOUNTER — Other Ambulatory Visit: Payer: Self-pay | Admitting: Internal Medicine

## 2022-04-12 DIAGNOSIS — M50323 Other cervical disc degeneration at C6-C7 level: Secondary | ICD-10-CM | POA: Diagnosis not present

## 2022-04-12 DIAGNOSIS — M5137 Other intervertebral disc degeneration, lumbosacral region: Secondary | ICD-10-CM | POA: Diagnosis not present

## 2022-04-12 DIAGNOSIS — M5031 Other cervical disc degeneration,  high cervical region: Secondary | ICD-10-CM | POA: Diagnosis not present

## 2022-04-12 DIAGNOSIS — M50321 Other cervical disc degeneration at C4-C5 level: Secondary | ICD-10-CM | POA: Diagnosis not present

## 2022-04-12 DIAGNOSIS — M50322 Other cervical disc degeneration at C5-C6 level: Secondary | ICD-10-CM | POA: Diagnosis not present

## 2022-04-12 DIAGNOSIS — M9901 Segmental and somatic dysfunction of cervical region: Secondary | ICD-10-CM | POA: Diagnosis not present

## 2022-04-12 DIAGNOSIS — M9905 Segmental and somatic dysfunction of pelvic region: Secondary | ICD-10-CM | POA: Diagnosis not present

## 2022-04-12 DIAGNOSIS — M5136 Other intervertebral disc degeneration, lumbar region: Secondary | ICD-10-CM | POA: Diagnosis not present

## 2022-04-12 DIAGNOSIS — M9904 Segmental and somatic dysfunction of sacral region: Secondary | ICD-10-CM | POA: Diagnosis not present

## 2022-04-12 DIAGNOSIS — M5413 Radiculopathy, cervicothoracic region: Secondary | ICD-10-CM | POA: Diagnosis not present

## 2022-04-12 DIAGNOSIS — M9903 Segmental and somatic dysfunction of lumbar region: Secondary | ICD-10-CM | POA: Diagnosis not present

## 2022-04-12 DIAGNOSIS — M5135 Other intervertebral disc degeneration, thoracolumbar region: Secondary | ICD-10-CM | POA: Diagnosis not present

## 2022-04-17 ENCOUNTER — Encounter: Payer: Self-pay | Admitting: Gastroenterology

## 2022-04-17 ENCOUNTER — Ambulatory Visit: Payer: PPO | Admitting: Physician Assistant

## 2022-04-22 ENCOUNTER — Ambulatory Visit (AMBULATORY_SURGERY_CENTER): Payer: PPO | Admitting: Gastroenterology

## 2022-04-22 ENCOUNTER — Encounter: Payer: Self-pay | Admitting: Gastroenterology

## 2022-04-22 VITALS — BP 147/76 | HR 57 | Temp 96.0°F | Resp 12 | Ht 71.0 in | Wt 180.0 lb

## 2022-04-22 DIAGNOSIS — D123 Benign neoplasm of transverse colon: Secondary | ICD-10-CM | POA: Diagnosis not present

## 2022-04-22 DIAGNOSIS — I10 Essential (primary) hypertension: Secondary | ICD-10-CM | POA: Diagnosis not present

## 2022-04-22 DIAGNOSIS — J449 Chronic obstructive pulmonary disease, unspecified: Secondary | ICD-10-CM | POA: Diagnosis not present

## 2022-04-22 DIAGNOSIS — Z09 Encounter for follow-up examination after completed treatment for conditions other than malignant neoplasm: Secondary | ICD-10-CM | POA: Diagnosis not present

## 2022-04-22 DIAGNOSIS — Z86010 Personal history of colonic polyps: Secondary | ICD-10-CM | POA: Diagnosis not present

## 2022-04-22 MED ORDER — SODIUM CHLORIDE 0.9 % IV SOLN: 500.0000 mL | INTRAVENOUS | Status: DC

## 2022-04-22 NOTE — Progress Notes (Signed)
History and Physical:  This patient presents for endoscopic testing for: Encounter Diagnosis  Name Primary?   Personal history of colonic polyps Yes    Surveillance colonoscopy  Sept 2020  - TA x 8 (some over 34m) No polyps 2010 colonoscopy Patient denies chronic abdominal pain, rectal bleeding, constipation or diarrhea.   Patient is otherwise without complaints or active issues today.   Past Medical History: Past Medical History:  Diagnosis Date   COPD (chronic obstructive pulmonary disease) ([place]    PFTs 01-2006 mild obstruction, (+) reponse to Bronchodilators     GERD (gastroesophageal reflux disease)    with certain foods   Hyperlipidemia    on meds   Hypertension    on meds   Seasonal allergies    Shingles    Thorax, 2008   Thyroid disease    on meds     Past Surgical History: Past Surgical History:  Procedure Laterality Date   COLONOSCOPY  11/16/2008   [place]   COLONOSCOPY  01/2019   HD-MAC-good prep-tics/TA x 8- 338yrecall   DENTAL SURGERY  2015   upper dentures   POLYPECTOMY  2020   TA x 8    Allergies: Allergies  Allergen Reactions   Tiotropium Bromide Monohydrate Hives   Bromide Ion [Bromine]     Outpatient Meds: Current Outpatient Medications  Medication Sig Dispense Refill   acetaminophen (TYLENOL) 500 MG tablet Take 500 mg by mouth every 6 (six) hours as needed.     aspirin EC 81 MG tablet Take 81 mg by mouth daily. Swallow whole.     metoprolol succinate (TOPROL-XL) 50 MG 24 hr tablet TAKE 1 TABLET BY MOUTH EVERY DAY 90 tablet 1   calcium carbonate (OS-CAL) 600 MG TABS tablet Take 600 mg by mouth at bedtime.      Cyanocobalamin (VITAMIN B 12 PO) Take 1,000 mcg by mouth 3 (three) times a week.     Krill Oil 500 MG CAPS Take 1 capsule by mouth daily.     levothyroxine (SYNTHROID) 88 MCG tablet Take 1 tablet (88 mcg total) by mouth daily before breakfast. 90 tablet 1   Multiple Vitamins-Minerals (MULTIVITAMIN PO) Take 1 tablet by mouth  daily. In the evening     simvastatin (ZOCOR) 40 MG tablet TAKE 1 TABLET BY MOUTH EVERYDAY AT BEDTIME 90 tablet 1   Turmeric 500 MG CAPS Take 500 mg by mouth daily.     Zinc 50 MG TABS Take 50 mg by mouth daily.     Current Facility-Administered Medications  Medication Dose Route Frequency Provider Last Rate Last Admin   0.9 %  sodium chloride infusion  500 mL Intravenous Continuous Danis, HeEstill CottaII, MD          ___________________________________________________________________ Objective   Exam:  BP (!) 151/74   Pulse 63   Temp (!) 96 F (35.6 C)   Ht _0  (1.803 m)   Wt 180 lb (81.6 kg)   SpO2 96%   BMI 25.10 kg/m   CV: regular , S1/S2 Resp: clear to auscultation bilaterally, normal RR and effort noted GI: soft, no tenderness, with active bowel sounds.   Assessment: Encounter Diagnosis  Name Primary?   Personal history of colonic polyps Yes     Plan: Colonoscopy  The benefits and risks of the planned procedure were described in detail with the patient or (when appropriate) their health care proxy.  Risks were outlined as including, but not limited to, bleeding, infection, perforation, adverse medication  reaction leading to cardiac or pulmonary decompensation, pancreatitis (if ERCP).  The limitation of incomplete mucosal visualization was also discussed.  No guarantees or warranties were given.    The patient is appropriate for an endoscopic procedure in the ambulatory setting.   - Wilfrid Lund, MD

## 2022-04-22 NOTE — Patient Instructions (Signed)
Read all of the handouts given to you by your recovery room nurse.  REsume all of your medications as ordered.  YOU HAD AN ENDOSCOPIC PROCEDURE TODAY AT [facility]:   Refer to the procedure report that was given to you for any specific questions about what was found during the examination.  If the procedure report does not answer your questions, please call your gastroenterologist to clarify.  If you requested that your care partner not be given the details of your procedure findings, then the procedure report has been included in a sealed envelope for you to review at your convenience later.  YOU SHOULD EXPECT: Some feelings of bloating in the abdomen. Passage of more gas than usual.  Walking can help get rid of the air that was put into your GI tract during the procedure and reduce the bloating. If you had a lower endoscopy (such as a colonoscopy or flexible sigmoidoscopy) you may notice spotting of blood in your stool or on the toilet paper. If you underwent a bowel prep for your procedure, you may not have a normal bowel movement for a few days.  Please Note:  You might notice some irritation and congestion in your nose or some drainage.  This is from the oxygen used during your procedure.  There is no need for concern and it should clear up in a day or so.  SYMPTOMS TO REPORT IMMEDIATELY:  Following lower endoscopy (colonoscopy or flexible sigmoidoscopy):  Excessive amounts of blood in the stool  Significant tenderness or worsening of abdominal pains  Swelling of the abdomen that is new, acute  Fever of 100F or higher   For urgent or emergent issues, a gastroenterologist can be reached at any hour by calling [phone]. Do not use MyChart messaging for urgent concerns.    DIET:  We do recommend a small meal at first, but then you may proceed to your regular diet.  Drink plenty of fluids but you should avoid alcoholic beverages for 24 hours. Try to increase the fiber  in your diet, and drink plenty of water.  ACTIVITY:  You should plan to take it easy for the rest of today and you should NOT DRIVE or use heavy machinery until tomorrow (because of the sedation medicines used during the test).    FOLLOW UP: Our staff will call the number listed on your records the next business day following your procedure.  We will call around 7:15- 8:00 am to check on you and address any questions or concerns that you may have regarding the information given to you following your procedure. If we do not reach you, we will leave a message.     If any biopsies were taken you will be contacted by phone or by letter within the next 1-3 weeks.  Please call us at [phone] if you have not heard about the biopsies in 3 weeks.    SIGNATURES/CONFIDENTIALITY: You and/or your care partner have signed paperwork which will be entered into your electronic medical record.  These signatures attest to the fact that that the information above on your After Visit Summary has been reviewed and is understood.  Full responsibility of the confidentiality of this discharge information lies with you and/or your care-partner.

## 2022-04-22 NOTE — Progress Notes (Signed)
Vital signs checked by:CW  The patient states no changes in medical or surgical history since pre-visit screening on 03/25/22.

## 2022-04-22 NOTE — Op Note (Signed)
[facility] Patient Name: Kevin [name] Procedure Date: 04/22/2022 11:47 AM MRN: [id number] Endoscopist: Mallie Mussel L. Loletha Carrow , MD, [id number] Age: 76 Referring MD:  Date of Birth: 1946/01/11 Gender: Male Account #: [id number] Procedure:                Colonoscopy Indications:              Surveillance: Personal history of adenomatous                            polyps on last colonoscopy 3 years ago                           TA x 8 (some > 65m) last colonoscopy Sept 2020                           no polyps in 2010 Medicines:                Monitored Anesthesia Care Procedure:                Pre-Anesthesia Assessment:                           - Prior to the procedure, a History and Physical                            was performed, and patient medications and                            allergies were reviewed. The patient's tolerance of                            previous anesthesia was also reviewed. The risks                            and benefits of the procedure and the sedation                            options and risks were discussed with the patient.                            All questions were answered, and informed consent                            was obtained. Prior Anticoagulants: The patient has                            taken no anticoagulant or antiplatelet agents. ASA                            Grade Assessment: II - A patient with mild systemic                            disease. After reviewing the risks and benefits,  the patient was deemed in satisfactory condition to                            undergo the procedure.                           After obtaining informed consent, the colonoscope                            was passed under direct vision. Throughout the                            procedure, the patient's blood pressure, pulse, and                            oxygen saturations were monitored continuously. The                             Olympus CF-HQ190L (Serial# [id number]) Colonoscope was                            introduced through the anus and advanced to the the                            cecum, identified by appendiceal orifice and                            ileocecal valve. The colonoscopy was performed                            without difficulty. The patient tolerated the                            procedure well. The quality of the bowel                            preparation was good. The ileocecal valve,                            appendiceal orifice, and rectum were photographed. Scope In: 11:51:52 AM Scope Out: 12:04:17 PM Scope Withdrawal Time: 0 hours 8 minutes 10 seconds  Total Procedure Duration: 0 hours 12 minutes 25 seconds  Findings:                 The perianal and digital rectal examinations were                            normal.                           A diminutive polyp was found in the proximal                            transverse colon. The polyp was sessile. The polyp  was removed with a cold snare. Resection and                            retrieval were complete.                           Repeat examination of right colon under NBI                            performed.                           Many diverticula were found in the left colon.                           Internal hemorrhoids were found.                           The exam was otherwise without abnormality on                            direct and retroflexion views. Complications:            No immediate complications. Estimated Blood Loss:     Estimated blood loss was minimal. Impression:               - One diminutive polyp in the proximal transverse                            colon, removed with a cold snare. Resected and                            retrieved.                           - Diverticulosis in the left colon.                           - Internal hemorrhoids.                            - The examination was otherwise normal on direct                            and retroflexion views. Recommendation:           - Patient has a contact number available for                            emergencies. The signs and symptoms of potential                            delayed complications were discussed with the                            patient. Return to normal activities tomorrow.  Written discharge instructions were provided to the                            patient.                           - Resume previous diet.                           - Continue present medications.                           - Await pathology results.                           - No repeat surveillance colonoscopy recommended                            due to age and current guidelines. Cailynn Bodnar L. Loletha Carrow, MD 04/22/2022 12:09:44 PM This report has been signed electronically.

## 2022-04-22 NOTE — Progress Notes (Signed)
To pacu, VSS. Report to [email or web address] 

## 2022-04-23 ENCOUNTER — Telehealth: Payer: Self-pay

## 2022-04-23 NOTE — Telephone Encounter (Signed)
Inbound call from patient stating he is doing fine.

## 2022-04-23 NOTE — Telephone Encounter (Signed)
  Follow up Call-     04/22/2022   10:59 AM  Call back number  Post procedure Call Back phone  # [phone]  Permission to leave phone message Yes    Post op call attempted, no answer, left WM.

## 2022-04-25 ENCOUNTER — Encounter: Payer: Self-pay | Admitting: Gastroenterology

## 2022-05-24 ENCOUNTER — Other Ambulatory Visit: Payer: Self-pay

## 2022-05-24 MED ORDER — SIMVASTATIN 40 MG PO TABS: 40.0000 mg | ORAL_TABLET | Freq: Every day | ORAL | 1 refills | Status: DC

## 2022-05-28 ENCOUNTER — Other Ambulatory Visit: Payer: Self-pay | Admitting: Internal Medicine

## 2022-07-02 ENCOUNTER — Other Ambulatory Visit (HOSPITAL_BASED_OUTPATIENT_CLINIC_OR_DEPARTMENT_OTHER): Payer: Self-pay

## 2022-07-02 ENCOUNTER — Encounter: Payer: Self-pay | Admitting: Internal Medicine

## 2022-07-02 ENCOUNTER — Ambulatory Visit (INDEPENDENT_AMBULATORY_CARE_PROVIDER_SITE_OTHER): Payer: PPO | Admitting: Internal Medicine

## 2022-07-02 VITALS — BP 118/68 | HR 78 | Temp 97.9°F | Resp 16 | Ht 71.0 in | Wt 176.1 lb

## 2022-07-02 DIAGNOSIS — S40812A Abrasion of left upper arm, initial encounter: Secondary | ICD-10-CM | POA: Diagnosis not present

## 2022-07-02 DIAGNOSIS — W19XXXA Unspecified fall, initial encounter: Secondary | ICD-10-CM | POA: Diagnosis not present

## 2022-07-02 MED ORDER — DOXYCYCLINE HYCLATE 100 MG PO TABS: 100.0000 mg | ORAL_TABLET | Freq: Two times a day (BID) | ORAL | 0 refills | Status: DC

## 2022-07-02 MED ORDER — DOXYCYCLINE HYCLATE 100 MG PO TABS
100.0000 mg | ORAL_TABLET | Freq: Two times a day (BID) | ORAL | 0 refills | Status: DC
  Filled 2022-07-02: qty 10, 5d supply, fill #0

## 2022-07-02 NOTE — Progress Notes (Unsigned)
   Subjective:    Patient ID: Kevin [name], male    DOB: 10-10-1945, 77 y.o.   MRN: [id number]  DOS:  07/02/2022 Type of visit - description: Acute  Injury happened a week ago Hit his left arm on the stair rail. He bled quite a bit. Since then, he is using topical antibiotic. Denies fever or chills. Denies other injuries  Review of Systems See above   Past Medical History:  Diagnosis Date   COPD (chronic obstructive pulmonary disease) ([place])    PFTs 01-2006 mild obstruction, (+) reponse to Bronchodilators     GERD (gastroesophageal reflux disease)    with certain foods   Hyperlipidemia    on meds   Hypertension    on meds   Seasonal allergies    Shingles    Thorax, 2008   Thyroid disease    on meds    Past Surgical History:  Procedure Laterality Date   COLONOSCOPY  11/16/2008   Sharlett Iles   COLONOSCOPY  01/2019   HD-MAC-good prep-tics/TA x 8- 39yrrecall   DENTAL SURGERY  2015   upper dentures   POLYPECTOMY  2020   TA x 8    Current Outpatient Medications  Medication Instructions   acetaminophen (TYLENOL) 500 mg, Oral, Every 6 hours PRN   aspirin EC 81 mg, Oral, Daily, Swallow whole.   calcium carbonate (OS-CAL) 600 mg, Oral, Daily at bedtime   Cyanocobalamin (VITAMIN B 12 PO) 1,000 mcg, 3 times weekly   Krill Oil 500 MG CAPS 1 capsule, Oral, Daily   levothyroxine (SYNTHROID) 88 mcg, Oral, Daily before breakfast   metoprolol succinate (TOPROL-XL) 50 MG 24 hr tablet TAKE 1 TABLET BY MOUTH EVERY DAY   Multiple Vitamins-Minerals (MULTIVITAMIN PO) 1 tablet, Oral, Daily, In the evening   simvastatin (ZOCOR) 40 mg, Oral, Daily-1800   Turmeric 500 mg, Oral, Daily   Zinc 50 mg, Oral, Daily       Objective:   Physical Exam BP 118/68   Pulse 78   Temp 97.9 F (36.6 C) (Oral)   Resp 16   Ht 5' 11"$  (1.803 m)   Wt 176 lb 2 oz (79.9 kg)   SpO2 97%   BMI 24.56 kg/m  General:   Well developed, NAD, BMI noted. HEENT:  Normocephalic . Face symmetric,  atraumatic Right arm: See picture Neurologic:  alert & oriented X3.  Speech normal, gait appropriate for age and unassisted Psych--  Cognition and judgment appear intact.  Cooperative with normal attention span and concentration.  Behavior appropriate. No anxious or depressed appearing.      Assessment      Assessment Hyperglycemia HTN Hypothyroidism Hyperlipidemia LUTS COPD: PFTs 2007 mild obstruction, + response to bronchodilators, h/o multiple inhalers intolerance  Shingles 2008, chest. CAD per CT 2020 (continue aspirin)  PLAN Wound, L arm: See picture, no obvious abscess, no warmness around the wound but has a small amount of discharge. He is up-to-date on Tdap Plan: Doxycycline Clean the area with soap and water, pat it dry, nonstick pad cover, call if not gradually better.  See AVS

## 2022-07-02 NOTE — Patient Instructions (Signed)
Clean the area with soap and water  Let the shower rinse the wound.  Pat it dry  Cover with a nonstick pad  Take the antibiotic by mouth for 5 days  Call if you have fever, chills, the area looks worse.  Call if not gradually better in the next few days

## 2022-07-03 NOTE — Assessment & Plan Note (Signed)
Wound, L arm: See picture, no obvious abscess, no warmness around the wound but has a small amount of discharge. He is up-to-date on Tdap Plan: Doxycycline Clean the area with soap and water, pat it dry, nonstick pad cover, call if not gradually better.  See AVS

## 2022-07-18 ENCOUNTER — Encounter: Payer: Self-pay | Admitting: Radiology

## 2022-10-01 ENCOUNTER — Other Ambulatory Visit: Payer: Self-pay | Admitting: Internal Medicine

## 2022-10-21 ENCOUNTER — Encounter: Payer: Self-pay | Admitting: Internal Medicine

## 2022-10-21 ENCOUNTER — Ambulatory Visit (INDEPENDENT_AMBULATORY_CARE_PROVIDER_SITE_OTHER): Payer: PPO | Admitting: Internal Medicine

## 2022-10-21 VITALS — BP 126/72 | HR 65 | Temp 97.5°F | Resp 16 | Ht 71.0 in | Wt 176.0 lb

## 2022-10-21 DIAGNOSIS — E039 Hypothyroidism, unspecified: Secondary | ICD-10-CM | POA: Diagnosis not present

## 2022-10-21 DIAGNOSIS — L821 Other seborrheic keratosis: Secondary | ICD-10-CM | POA: Diagnosis not present

## 2022-10-21 DIAGNOSIS — I10 Essential (primary) hypertension: Secondary | ICD-10-CM

## 2022-10-21 LAB — BASIC METABOLIC PANEL
BUN: 14 mg/dL (ref 6–23)
CO2: 27 mEq/L (ref 19–32)
Calcium: 8.9 mg/dL (ref 8.4–10.5)
Chloride: 103 mEq/L (ref 96–112)
Creatinine, Ser: 0.96 mg/dL (ref 0.40–1.50)
GFR: 75.45 mL/min (ref >60.00)
Glucose, Bld: 90 mg/dL (ref 70–99)
Potassium: 4.5 mEq/L (ref 3.5–5.1)
Sodium: 139 mEq/L (ref 135–145)

## 2022-10-21 LAB — TSH: TSH: 0.74 u[IU]/mL (ref 0.35–5.50)

## 2022-10-21 NOTE — Progress Notes (Signed)
   Subjective:    Patient ID: Kevin [name], male    DOB: 1946/03/25, 77 y.o.   MRN: [id number]  DOS:  10/21/2022 Type of visit - description: Follow-up  Chronic medical problems were discussed. Good compliance with medication. Ambulatory BPs reviewed. Denies cough or difficulty breathing. No chest pain.   Review of Systems See above   Past Medical History:  Diagnosis Date   COPD (chronic obstructive pulmonary disease) (HCC)    PFTs 01-2006 mild obstruction, (+) reponse to Bronchodilators     GERD (gastroesophageal reflux disease)    with certain foods   Hyperlipidemia    on meds   Hypertension    on meds   Seasonal allergies    Shingles    Thorax, 2008   Thyroid disease    on meds    Past Surgical History:  Procedure Laterality Date   COLONOSCOPY  11/16/2008   Jarold Motto   COLONOSCOPY  01/2019   HD-MAC-good prep-tics/TA x 8- 75yr recall   DENTAL SURGERY  2015   upper dentures   POLYPECTOMY  2020   TA x 8    Current Outpatient Medications  Medication Instructions   acetaminophen (TYLENOL) 500 mg, Oral, Every 6 hours PRN   aspirin EC 81 mg, Oral, Daily, Swallow whole.   calcium carbonate (OS-CAL) 600 mg, Oral, Daily at bedtime   Cyanocobalamin (VITAMIN B 12 PO) 1,000 mcg, Oral, 3 times weekly   Krill Oil 500 MG CAPS 1 capsule, Oral, Daily   levothyroxine (SYNTHROID) 88 mcg, Oral, Daily before breakfast   metoprolol succinate (TOPROL-XL) 50 MG 24 hr tablet TAKE 1 TABLET BY MOUTH EVERY DAY   Multiple Vitamins-Minerals (MULTIVITAMIN PO) 1 tablet, Oral, Daily, In the evening   simvastatin (ZOCOR) 40 mg, Oral, Daily-1800   Turmeric 500 mg, Oral, Daily   Zinc 50 mg, Oral, Daily       Objective:   Physical Exam BP 126/72   Pulse 65   Temp (!) 97.5 F (36.4 C) (Oral)   Resp 16   Ht 5\' 11"  (1.803 m)   Wt 176 lb (79.8 kg)   SpO2 96%   BMI 24.55 kg/m  General:   Well developed, NAD, BMI noted. HEENT:  Normocephalic . Face symmetric, atraumatic Lungs:   CTA B Normal respiratory effort, no intercostal retractions, no accessory muscle use. Heart: RRR,  no murmur.  Lower extremities: no pretibial edema bilaterally  Skin: Has few large skin lesions on the back, most likely SKs, some of them have the base irritated Neurologic:  alert & oriented X3.  Speech normal, gait appropriate for age and unassisted Psych--  Cognition and judgment appear intact.  Cooperative with normal attention span and concentration.  Behavior appropriate. No anxious or depressed appearing.      Assessment     Assessment Hyperglycemia HTN Hypothyroidism Hyperlipidemia LUTS COPD: PFTs 2007 mild obstruction, + response to bronchodilators, h/o multiple inhalers intolerance  Shingles 2008, chest. CAD per CT 2020 (continue aspirin)  PLAN HTN: Ambulatory BPs in the 130s, 140.  On metoprolol.  Check a BMP. Hypothyroidism: On levothyroxine, check TSH, further advised for results. COPD: No symptoms SKs: Multiple SKs, has not seen dermatology in a while, referral sent. RTC CPX November 2024

## 2022-10-21 NOTE — Patient Instructions (Addendum)
Happy Birthday!  Vaccines I recommend: RSV vaccine  Check the  blood pressure regularly BP GOAL is between 110/65 and  135/85. If it is consistently higher or lower, let me know  We are referring you to dermatologist   GO TO THE LAB : Get the blood work     GO TO THE FRONT DESK, PLEASE SCHEDULE YOUR APPOINTMENTS Come back for   a physical exam by November 2024

## 2022-10-21 NOTE — Assessment & Plan Note (Signed)
HTN: Ambulatory BPs in the 130s, 140.  On metoprolol.  Check a BMP. Hypothyroidism: On levothyroxine, check TSH, further advised for results. COPD: No symptoms SKs: Multiple SKs, has not seen dermatology in a while, referral sent. RTC CPX November 2024

## 2022-11-21 ENCOUNTER — Other Ambulatory Visit: Payer: Self-pay | Admitting: Internal Medicine

## 2022-12-02 DIAGNOSIS — E039 Hypothyroidism, unspecified: Secondary | ICD-10-CM | POA: Diagnosis not present

## 2022-12-02 DIAGNOSIS — G629 Polyneuropathy, unspecified: Secondary | ICD-10-CM | POA: Diagnosis not present

## 2022-12-02 DIAGNOSIS — E785 Hyperlipidemia, unspecified: Secondary | ICD-10-CM | POA: Diagnosis not present

## 2022-12-02 DIAGNOSIS — Z7982 Long term (current) use of aspirin: Secondary | ICD-10-CM | POA: Diagnosis not present

## 2022-12-02 DIAGNOSIS — M1990 Unspecified osteoarthritis, unspecified site: Secondary | ICD-10-CM | POA: Diagnosis not present

## 2022-12-02 DIAGNOSIS — I10 Essential (primary) hypertension: Secondary | ICD-10-CM | POA: Diagnosis not present

## 2022-12-20 DIAGNOSIS — L821 Other seborrheic keratosis: Secondary | ICD-10-CM | POA: Diagnosis not present

## 2022-12-20 DIAGNOSIS — L820 Inflamed seborrheic keratosis: Secondary | ICD-10-CM | POA: Diagnosis not present

## 2022-12-20 DIAGNOSIS — L570 Actinic keratosis: Secondary | ICD-10-CM | POA: Diagnosis not present

## 2022-12-20 DIAGNOSIS — D485 Neoplasm of uncertain behavior of skin: Secondary | ICD-10-CM | POA: Diagnosis not present

## 2022-12-20 DIAGNOSIS — L578 Other skin changes due to chronic exposure to nonionizing radiation: Secondary | ICD-10-CM | POA: Diagnosis not present

## 2022-12-26 ENCOUNTER — Encounter (INDEPENDENT_AMBULATORY_CARE_PROVIDER_SITE_OTHER): Payer: Self-pay

## 2023-01-01 IMAGING — DX DG CERVICAL SPINE COMPLETE 4+V
6 series · 6 of 6 positions shown · non-contrast
Comparison: None.

CLINICAL DATA: Neck pain and stiffness. No trauma history
submitted.

EXAM:
CERVICAL SPINE - COMPLETE 4+ VIEW

[c-spine lat]
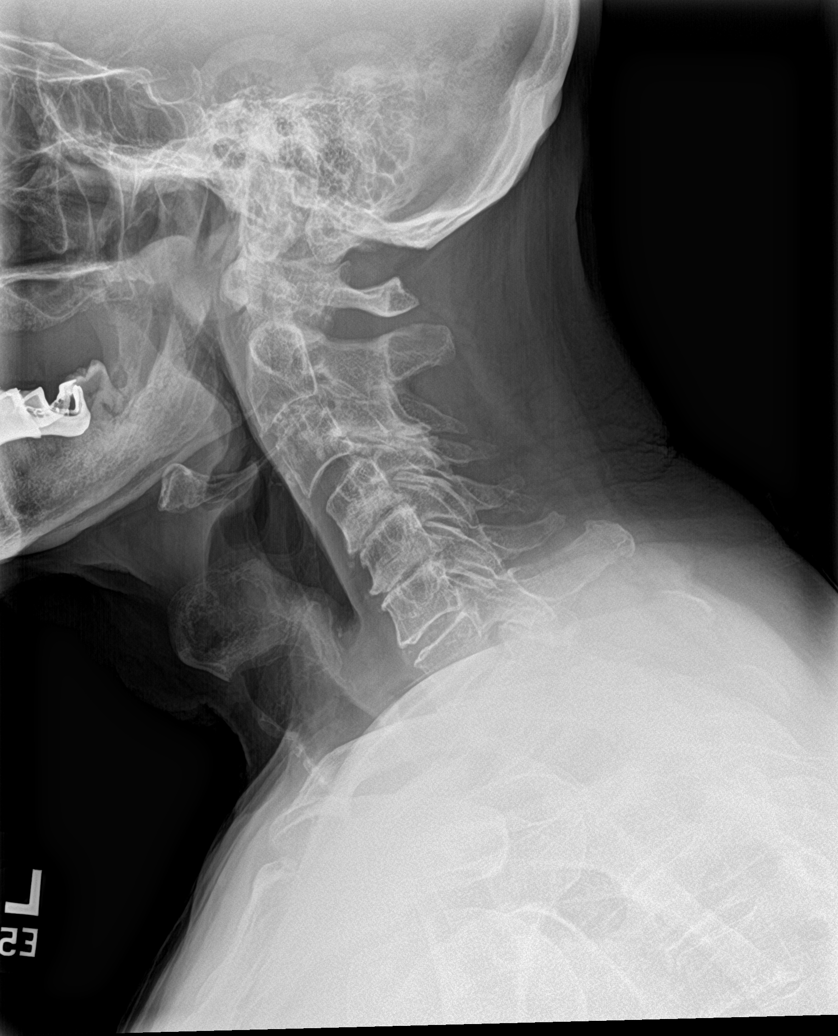

[c-spine obl (1 of 2)]
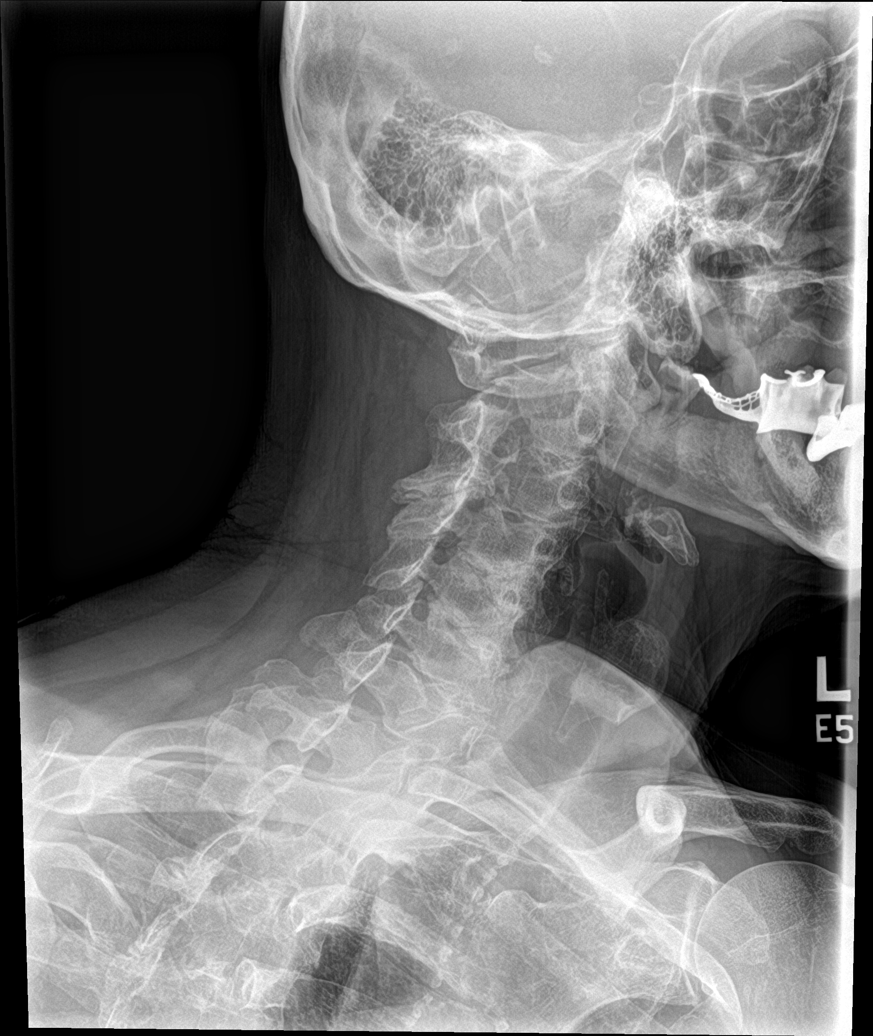

[c-spine obl (2 of 2)]
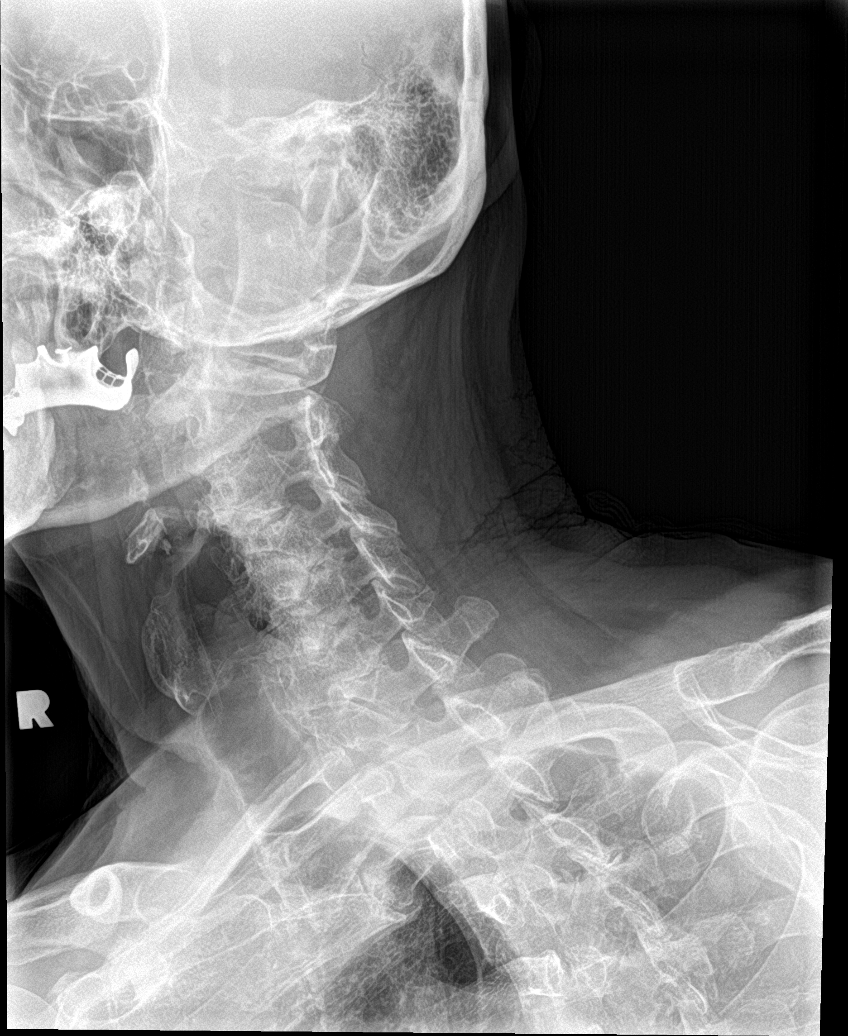

[c-spine ap]
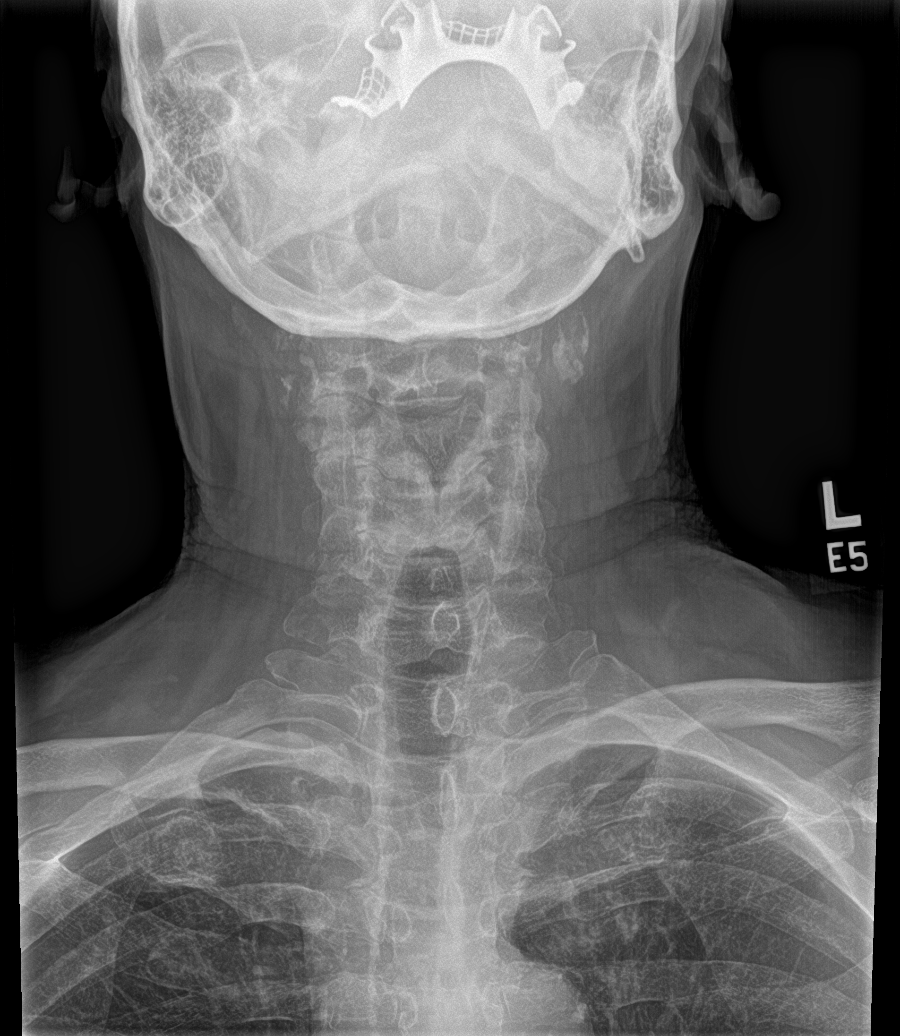

[c-spine open mouth]
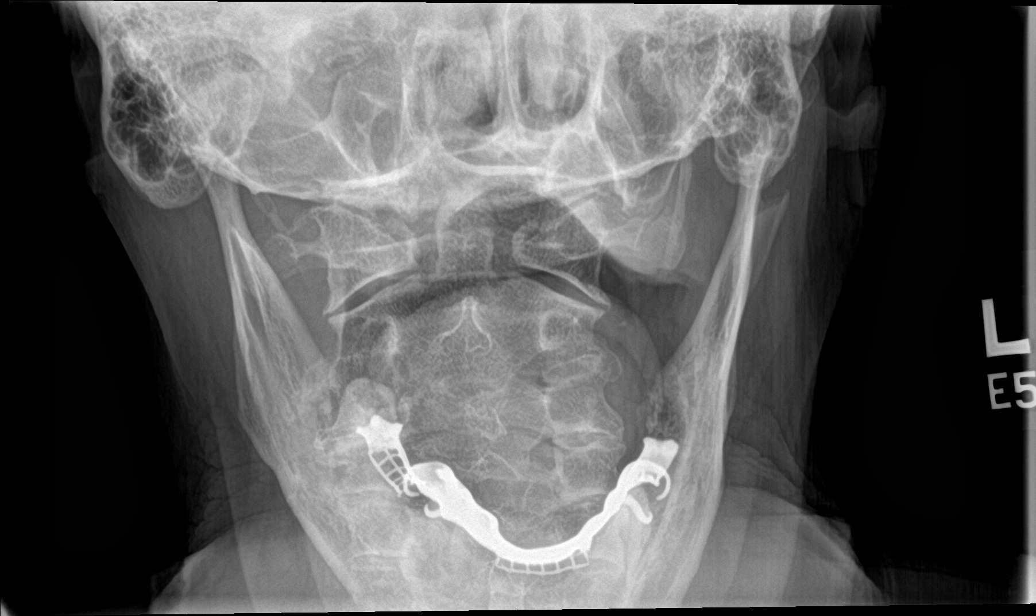

[c-spine swimmers]
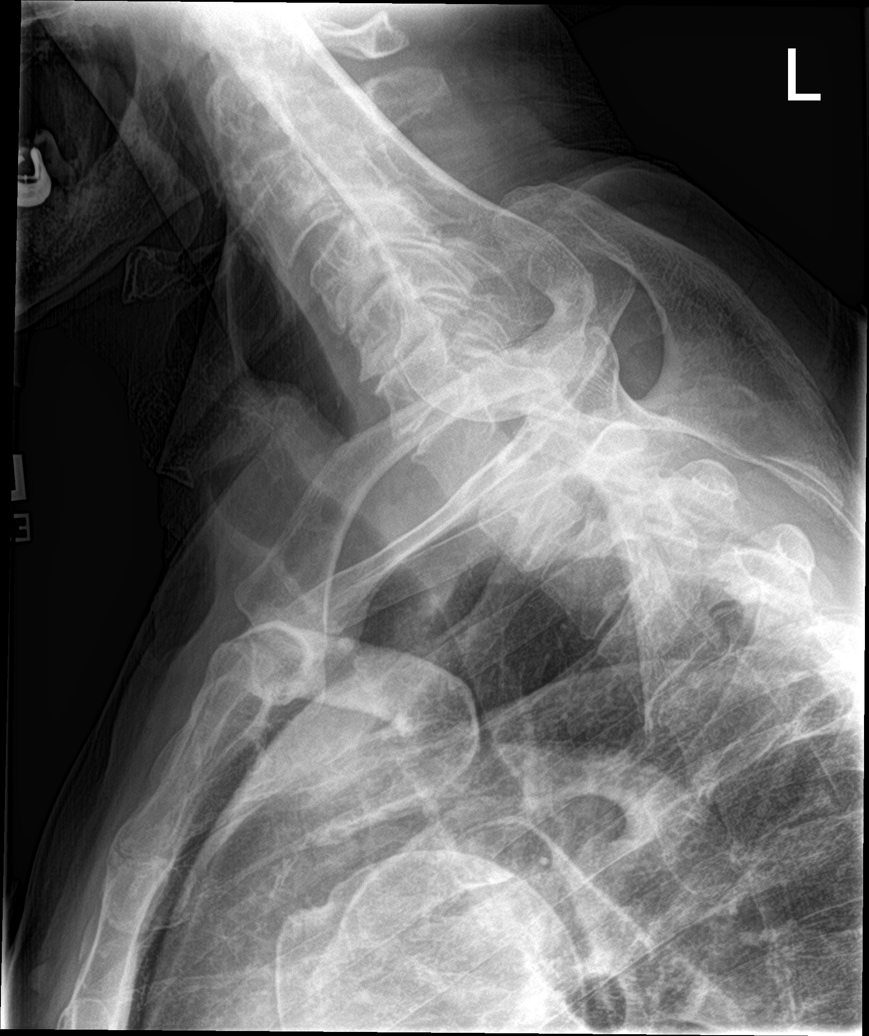

[6 of 6 positions shown; findings below may reference images not displayed]

FINDINGS: The lateral view images through the bottom of C7. Prevertebral soft
tissues are within normal limits. Maintenance of vertebral body
height. Trace C5-6 retrolisthesis. Loss of intervertebral disc
height at C4-5 and C5-6 is severe.

Left greater than right neural foraminal narrowing at multiple
levels, most significant C3-4 on the left and C4-5 on the right.
Facets are well-aligned. Lateral masses and odontoid process intact.
Grossly normal cervicothoracic junction on swimmer's view.
IMPRESSION: Advanced midcervical spondylosis with trace C5-6 retrolisthesis and
areas of bilateral neural foraminal narrowing.

## 2023-01-23 DIAGNOSIS — D044 Carcinoma in situ of skin of scalp and neck: Secondary | ICD-10-CM | POA: Diagnosis not present

## 2023-01-23 DIAGNOSIS — L570 Actinic keratosis: Secondary | ICD-10-CM | POA: Diagnosis not present

## 2023-01-23 DIAGNOSIS — D485 Neoplasm of uncertain behavior of skin: Secondary | ICD-10-CM | POA: Diagnosis not present

## 2023-01-29 DIAGNOSIS — D044 Carcinoma in situ of skin of scalp and neck: Secondary | ICD-10-CM | POA: Diagnosis not present

## 2023-01-29 DIAGNOSIS — D485 Neoplasm of uncertain behavior of skin: Secondary | ICD-10-CM | POA: Diagnosis not present

## 2023-01-29 DIAGNOSIS — L0889 Other specified local infections of the skin and subcutaneous tissue: Secondary | ICD-10-CM | POA: Diagnosis not present

## 2023-01-31 ENCOUNTER — Other Ambulatory Visit (HOSPITAL_BASED_OUTPATIENT_CLINIC_OR_DEPARTMENT_OTHER): Payer: Self-pay

## 2023-01-31 ENCOUNTER — Encounter: Payer: Self-pay | Admitting: Internal Medicine

## 2023-01-31 ENCOUNTER — Ambulatory Visit (INDEPENDENT_AMBULATORY_CARE_PROVIDER_SITE_OTHER): Payer: PPO | Admitting: Internal Medicine

## 2023-01-31 VITALS — BP 122/66 | HR 71 | Temp 98.3°F | Resp 16 | Ht 71.0 in | Wt 173.4 lb

## 2023-01-31 DIAGNOSIS — T148XXA Other injury of unspecified body region, initial encounter: Secondary | ICD-10-CM

## 2023-01-31 DIAGNOSIS — L089 Local infection of the skin and subcutaneous tissue, unspecified: Secondary | ICD-10-CM

## 2023-01-31 MED ORDER — CEFADROXIL 500 MG PO CAPS
500.0000 mg | ORAL_CAPSULE | Freq: Two times a day (BID) | ORAL | 0 refills | Status: DC
  Filled 2023-01-31: qty 4, 2d supply, fill #0
  Filled 2023-01-31: qty 6, 3d supply, fill #0
  Filled 2023-01-31: qty 10, 5d supply, fill #0

## 2023-01-31 NOTE — Patient Instructions (Signed)
Take the antibiotic for 5 days  Gently clean the area with soap and water and pat it dry.  Put over-the-counter hydrocortisone 1% cream around the wound.  Let us know if you do not continue improving

## 2023-01-31 NOTE — Progress Notes (Unsigned)
Subjective:    Patient ID: Kevin [name], male    DOB: 07-17-1945, 77 y.o.   MRN: [id number]  DOS:  01/31/2023 Type of visit - description: Acute, wound infection?  Had a skin cancer excision of the left arm 4 days ago. He noted swelling 2 days ago, called his dermatologist, they were supposed to send antibiotic but apparently they did not. He also noticed some pus. He is using peroxide to keep it clean. At this point area looks better.  Review of Systems See above   Past Medical History:  Diagnosis Date   COPD (chronic obstructive pulmonary disease) (HCC)    PFTs 01-2006 mild obstruction, (+) reponse to Bronchodilators     GERD (gastroesophageal reflux disease)    with certain foods   Hyperlipidemia    on meds   Hypertension    on meds   Seasonal allergies    Shingles    Thorax, 2008   Thyroid disease    on meds    Past Surgical History:  Procedure Laterality Date   COLONOSCOPY  11/16/2008   Jarold Motto   COLONOSCOPY  01/2019   HD-MAC-good prep-tics/TA x 8- 4yr recall   DENTAL SURGERY  2015   upper dentures   POLYPECTOMY  2020   TA x 8    Current Outpatient Medications  Medication Instructions   acetaminophen (TYLENOL) 500 mg, Oral, Every 6 hours PRN   aspirin EC 81 mg, Oral, Daily, Swallow whole.   calcium carbonate (OS-CAL) 600 mg, Oral, Daily at bedtime   cefadroxil (DURICEF) 500 mg, Oral, 2 times daily   Cyanocobalamin (VITAMIN B 12 PO) 1,000 mcg, Oral, 3 times weekly   Krill Oil 500 MG CAPS 1 capsule, Oral, Daily   levothyroxine (SYNTHROID) 88 mcg, Oral, Daily before breakfast   metoprolol succinate (TOPROL-XL) 50 mg, Oral, Daily   Multiple Vitamins-Minerals (MULTIVITAMIN PO) 1 tablet, Oral, Daily, In the evening   simvastatin (ZOCOR) 40 mg, Oral, Daily-1800   Turmeric 500 mg, Oral, Daily   Zinc 50 mg, Oral, Daily       Objective:   Physical Exam BP 122/66   Pulse 71   Temp 98.3 F (36.8 C) (Oral)   Resp 16   Ht 5\' 11"  (1.803 m)   Wt 173 lb 6  oz (78.6 kg)   SpO2 93%   BMI 24.18 kg/m  General:   Well developed, NAD, BMI noted. HEENT:  Normocephalic . Face symmetric, atraumatic Skin: See picture, around the biopsy there is some redness and swelling but no pockets, abscess, no discharge. Neurologic:  alert & oriented X3.  Speech normal, gait appropriate for age and unassisted Psych--  Cognition and judgment appear intact.  Cooperative with normal attention span and concentration.  Behavior appropriate. No anxious or depressed appearing.      Assessment    Assessment Hyperglycemia HTN Hypothyroidism Hyperlipidemia LUTS COPD: PFTs 2007 mild obstruction, + response to bronchodilators, h/o multiple inhalers intolerance  Shingles 2008, chest. CAD per CT 2020 (continue aspirin)  PLAN Surgical wound infection: 2 days ago, he saw pus coming from the area, is getting better now, to be safe will prescribe antibiotics x 5 days, see prescription.  Recommend local care with soap, water and hydrocortisone 1%. Call if not gradually better

## 2023-02-01 NOTE — Assessment & Plan Note (Signed)
Surgical wound infection: 2 days ago, he saw pus coming from the area, is getting better now, to be safe will prescribe antibiotics x 5 days, see prescription.  Recommend local care with soap, water and hydrocortisone 1%. Call if not gradually better

## 2023-02-03 ENCOUNTER — Other Ambulatory Visit (HOSPITAL_BASED_OUTPATIENT_CLINIC_OR_DEPARTMENT_OTHER): Payer: Self-pay

## 2023-02-05 ENCOUNTER — Other Ambulatory Visit: Payer: Self-pay | Admitting: Internal Medicine

## 2023-02-05 ENCOUNTER — Ambulatory Visit (INDEPENDENT_AMBULATORY_CARE_PROVIDER_SITE_OTHER): Payer: PPO | Admitting: *Deleted

## 2023-02-05 DIAGNOSIS — Z0000 Encounter for general adult medical examination without abnormal findings: Secondary | ICD-10-CM | POA: Diagnosis not present

## 2023-02-05 NOTE — Progress Notes (Signed)
Subjective:   Kevin [name] is a 77 y.o. male who presents for Medicare Annual/Subsequent preventive examination.  Visit Complete: Virtual  I connected with  Tressie Stalker on 02/05/23 by a audio enabled telemedicine application and verified that I am speaking with the correct person using two identifiers.  Patient Location: Home  Provider Location: Office/Clinic  I discussed the limitations of evaluation and management by telemedicine. The patient expressed understanding and agreed to proceed.   Cardiac Risk Factors include: male gender;advanced age (>21men, >29 women);dyslipidemia;hypertension     Objective:    Because this visit was a virtual/telehealth visit, some criteria may be missing or patient reported. Any vitals not documented were not able to be obtained and vitals that have been documented are patient reported.       02/05/2023   11:06 AM 01/29/2022   11:02 AM 01/27/2021   12:08 PM 12/18/2018    9:45 AM 12/12/2017    8:14 AM 12/10/2016    8:13 AM  Advanced Directives  Does Patient Have a Medical Advance Directive? Yes No No No No No  Type of Estate agent of [place];Living will       Does patient want to make changes to medical advance directive? No - Patient declined       Copy of Healthcare Power of Attorney in Chart? Yes - validated most recent copy scanned in chart (See row information)       Would patient like information on creating a medical advance directive?  No - Patient declined No - Patient declined No - Patient declined No - Patient declined Yes (MAU/Ambulatory/Procedural Areas - Information given)    Current Medications (verified) Outpatient Encounter Medications as of 02/05/2023  Medication Sig   acetaminophen (TYLENOL) 500 MG tablet Take 500 mg by mouth every 6 (six) hours as needed.   aspirin EC 81 MG tablet Take 81 mg by mouth daily. Swallow whole.   calcium carbonate (OS-CAL) 600 MG TABS tablet Take 600 mg by mouth at bedtime.     cefadroxil (DURICEF) 500 MG capsule Take 1 capsule (500 mg total) by mouth 2 (two) times daily.   Cyanocobalamin (VITAMIN B 12 PO) Take 1,000 mcg by mouth 3 (three) times a week.   Krill Oil 500 MG CAPS Take 1 capsule by mouth daily.   levothyroxine (SYNTHROID) 88 MCG tablet Take 1 tablet (88 mcg total) by mouth daily before breakfast.   metoprolol succinate (TOPROL-XL) 50 MG 24 hr tablet Take 1 tablet (50 mg total) by mouth daily.   Multiple Vitamins-Minerals (MULTIVITAMIN PO) Take 1 tablet by mouth daily. In the evening   simvastatin (ZOCOR) 40 MG tablet Take 1 tablet (40 mg total) by mouth daily at 6 PM.   Turmeric 500 MG CAPS Take 500 mg by mouth daily.   Zinc 50 MG TABS Take 50 mg by mouth daily.   No facility-administered encounter medications on file as of 02/05/2023.    Allergies (verified) Tiotropium bromide monohydrate and Bromide ion [bromine]   History: Past Medical History:  Diagnosis Date   COPD (chronic obstructive pulmonary disease) (HCC)    PFTs 01-2006 mild obstruction, (+) reponse to Bronchodilators     GERD (gastroesophageal reflux disease)    with certain foods   Hyperlipidemia    on meds   Hypertension    on meds   Seasonal allergies    Shingles    Thorax, 2008   Thyroid disease    on meds   Past  Surgical History:  Procedure Laterality Date   COLONOSCOPY  11/16/2008   Jarold Motto   COLONOSCOPY  01/2019   HD-MAC-good prep-tics/TA x 8- 30yr recall   DENTAL SURGERY  2015   upper dentures   POLYPECTOMY  2020   TA x 8   Family History  Problem Relation Age of Onset   Hypertension Mother        M and S   CAD Father        F MI ~ 36 y/o   Colon cancer Neg Hx    Prostate cancer Neg Hx    Diabetes Neg Hx    Rectal cancer Neg Hx    Stomach cancer Neg Hx    Colon polyps Neg Hx    Esophageal cancer Neg Hx    Social History   Socioeconomic History   Marital status: Married    Spouse name: Not on file   Number of children: 3   Years of education:  Not on file   Highest education level: Not on file  Occupational History   Occupation: retired 01-2013    Employer: [employer]  Tobacco Use   Smoking status: Former    Current packs/day: 0.00    Types: Cigarettes    Start date: 1960    Quit date: 05/13/2004    Years since quitting: 18.7   Smokeless tobacco: Never   Tobacco comments:    used to smoke 2 ppd   Vaping Use   Vaping status: Never Used  Substance and Sexual Activity   Alcohol use: Not Currently    Comment: 2-3  beers some days, stopped 02-2022   Drug use: No   Sexual activity: Not Currently  Other Topics Concern   Not on file  Social History Narrative   Lives w/ wife   3 children   Daughter lives in [place], Son in [place]   Social Determinants of Health   Financial Resource Strain: Medium Risk (02/05/2023)   Overall Financial Resource Strain (CARDIA)    Difficulty of Paying Living Expenses: Somewhat hard  Food Insecurity: No Food Insecurity (02/05/2023)   Hunger Vital Sign    Worried About Running Out of Food in the Last Year: Never true    Ran Out of Food in the Last Year: Never true  Transportation Needs: No Transportation Needs (02/05/2023)   PRAPARE - Administrator, Civil Service (Medical): No    Lack of Transportation (Non-Medical): No  Physical Activity: Inactive (02/05/2023)   Exercise Vital Sign    Days of Exercise per Week: 0 days    Minutes of Exercise per Session: 0 min  Stress: No Stress Concern Present (02/05/2023)   [employer] of Occupational Health - Occupational Stress Questionnaire    Feeling of Stress : Not at all  Social Connections: Moderately Isolated (02/05/2023)   Social Connection and Isolation Panel [NHANES]    Frequency of Communication with Friends and Family: Never    Frequency of Social Gatherings with Friends and Family: Once a week    Attends Religious Services: More than 4 times per year    Active Member of [employer] or Organizations: No    Attends Engineer, structural:  Never    Marital Status: Married    Tobacco Counseling Counseling given: Not Answered Tobacco comments: used to smoke 2 ppd    Clinical Intake:  Pre-visit preparation completed: Yes  Pain : No/denies pain  Nutritional Risks: None Diabetes: No  How often do you need to have  someone help you when you read instructions, pamphlets, or other written materials from your doctor or pharmacy?: 1 - Never  Interpreter Needed?: No  Information entered by :: Donne Anon, CMA   Activities of Daily Living    02/05/2023   11:03 AM  In your present state of health, do you have any difficulty performing the following activities:  Hearing? 0  Vision? 0  Difficulty concentrating or making decisions? 0  Walking or climbing stairs? 0  Dressing or bathing? 0  Doing errands, shopping? 0  Preparing Food and eating ? N  Using the Toilet? N  In the past six months, have you accidently leaked urine? Y  Do you have problems with loss of bowel control? N  Managing your Medications? N  Managing your Finances? N  Housekeeping or managing your Housekeeping? N    Patient Care Team: Wanda Plump, MD as PCP - General Danis, Andreas Blower, MD as Consulting Physician (Gastroenterology) [place], Elvin So, MD as Referring Physician (Dermatology)  Indicate any recent Medical Services you may have received from other than [facility] providers in the past year (date may be approximate).     Assessment:   This is a routine wellness examination for [place].  Hearing/Vision screen No results found.   Goals Addressed   None    Depression Screen    02/05/2023   11:09 AM 01/31/2023    2:07 PM 10/21/2022    9:50 AM 07/02/2022    1:06 PM 03/20/2022    9:49 AM 01/29/2022   11:02 AM 08/27/2021   10:05 AM  PHQ 2/9 Scores  PHQ - 2 Score 0 0 0 0 0 0 0    Fall Risk    02/05/2023   11:05 AM 01/31/2023    2:07 PM 10/21/2022    9:49 AM 07/02/2022    1:06 PM 03/20/2022    9:49 AM  Fall Risk   Falls in the  past year? 1 0 0 1 0  Number falls in past yr: 0 0 0 0 0  Injury with Fall? 1 0 0 1 0  Risk for fall due to : History of fall(s)      Follow up Falls evaluation completed Falls evaluation completed Falls evaluation completed Education provided;Falls evaluation completed Falls evaluation completed    MEDICARE RISK AT HOME: Medicare Risk at Home Any stairs in or around the home?: Yes If so, are there any without handrails?: No Home free of loose throw rugs in walkways, pet beds, electrical cords, etc?: Yes Adequate lighting in your home to reduce risk of falls?: Yes Life alert?: No Use of a cane, walker or w/c?: No Grab bars in the bathroom?: No Shower chair or bench in shower?: No Elevated toilet seat or a handicapped toilet?: No  TIMED UP AND GO:  Was the test performed?  No    Cognitive Function:    12/10/2016    8:17 AM  MMSE - Mini Mental State Exam  Orientation to time 5  Orientation to Place 5  Registration 3  Attention/ Calculation 4  Recall 2  Language- name 2 objects 2  Language- repeat 1  Language- follow 3 step command 3  Language- read & follow direction 1  Write a sentence 1  Copy design 1  Total score 28        02/05/2023   11:11 AM 01/29/2022   11:07 AM  6CIT Screen  What Year? 0 points 0 points  What month? 0 points  0 points  What time? 0 points 0 points  Count back from 20 0 points 0 points  Months in reverse 0 points 2 points  Repeat phrase 0 points 0 points  Total Score 0 points 2 points    Immunizations Immunization History  Administered Date(s) Administered   Fluad Quad(high Dose 65+) 01/15/2019   Influenza Split 05/22/2012   Influenza, High Dose Seasonal PF 02/10/2013, 03/11/2016, 01/29/2020, 01/22/2021   Influenza,inj,Quad PF,6+ Mos 02/21/2014   Influenza-Unspecified 02/11/2017, 02/10/2018, 02/10/2022, 12/13/2022   PFIZER Comirnaty(Gray Top)Covid-19 Tri-Sucrose Vaccine 09/12/2020   PFIZER(Purple Top)SARS-COV-2 Vaccination 06/03/2019,  06/24/2019, 04/11/2020   Pneumococcal Conjugate-13 03/23/2014   Pneumococcal Polysaccharide-23 04/15/2007, 04/20/2013   Td 05/13/1998, 08/01/2008   Tdap 05/13/2019   Zoster Recombinant(Shingrix) 01/15/2019, 04/16/2019   Zoster, Live 09/14/2013    TDAP status: Up to date  Flu Vaccine status: Up to date  Pneumococcal vaccine status: Up to date  Covid-19 vaccine status: Information provided on how to obtain vaccines.   Qualifies for Shingles Vaccine? Yes   Zostavax completed Yes   Shingrix Completed?: Yes  Screening Tests Health Maintenance  Topic Date Due   Medicare Annual Wellness (AWV)  01/30/2023   DTaP/Tdap/Td (4 - Td or Tdap) 05/12/2029   Pneumonia Vaccine 46+ Years old  Completed   INFLUENZA VACCINE  Completed   Hepatitis C Screening  Completed   Zoster Vaccines- Shingrix  Completed   HPV VACCINES  Aged Out   Colonoscopy  Discontinued   COVID-19 Vaccine  Discontinued    Health Maintenance  Health Maintenance Due  Topic Date Due   Medicare Annual Wellness (AWV)  01/30/2023    Colorectal cancer screening: No longer required.   Lung Cancer Screening: (Low Dose CT Chest recommended if Age 44-80 years, 20 pack-year currently smoking OR have quit w/in 15years.) does not qualify.   Additional Screening:  Hepatitis C Screening: does qualify; Completed 12/12/17  Vision Screening: Recommended annual ophthalmology exams for early detection of glaucoma and other disorders of the eye. Is the patient up to date with their annual eye exam?  No  Who is the provider or what is the name of the office in which the patient attends annual eye exams? [facility]. If pt is not established with a provider, would they like to be referred to a provider to establish care? No .   Dental Screening: Recommended annual dental exams for proper oral hygiene  Diabetic Foot Exam: N/a  Community Resource Referral / Chronic Care Management: CRR required this visit?  No   CCM required  this visit?  No     Plan:     I have personally reviewed and noted the following in the patient's chart:   Medical and social history Use of alcohol, tobacco or illicit drugs  Current medications and supplements including opioid prescriptions. Patient is not currently taking opioid prescriptions. Functional ability and status Nutritional status Physical activity Advanced directives List of other physicians Hospitalizations, surgeries, and ER visits in previous 12 months Vitals Screenings to include cognitive, depression, and falls Referrals and appointments  In addition, I have reviewed and discussed with patient certain preventive protocols, quality metrics, and best practice recommendations. A written personalized care plan for preventive services as well as general preventive health recommendations were provided to patient.     Donne Anon, CMA   02/05/2023   After Visit Summary: (MyChart) Due to this being a telephonic visit, the after visit summary with patients personalized plan was offered to patient via MyChart  Nurse Notes: None

## 2023-02-05 NOTE — Patient Instructions (Signed)
Kevin [name] , Thank you for taking time to come for your Medicare Wellness Visit. I appreciate your ongoing commitment to your health goals. Please review the following plan we discussed and let me know if I can assist you in the future.     This is a list of the screening recommended for you and due dates:  Health Maintenance  Topic Date Due   Medicare Annual Wellness Visit  02/05/2024   DTaP/Tdap/Td vaccine (4 - Td or Tdap) 05/12/2029   Pneumonia Vaccine  Completed   Flu Shot  Completed   Hepatitis C Screening  Completed   Zoster (Shingles) Vaccine  Completed   HPV Vaccine  Aged Out   Colon Cancer Screening  Discontinued   COVID-19 Vaccine  Discontinued    Next appointment: Follow up in one year for your annual wellness visit.   Preventive Care 15 Years and Older, Male Preventive care refers to lifestyle choices and visits with your health care provider that can promote health and wellness. What does preventive care include? A yearly physical exam. This is also called an annual well check. Dental exams once or twice a year. Routine eye exams. Ask your health care provider how often you should have your eyes checked. Personal lifestyle choices, including: Daily care of your teeth and gums. Regular physical activity. Eating a healthy diet. Avoiding tobacco and drug use. Limiting alcohol use. Practicing safe sex. Taking low doses of aspirin every day. Taking vitamin and mineral supplements as recommended by your health care provider. What happens during an annual well check? The services and screenings done by your health care provider during your annual well check will depend on your age, overall health, lifestyle risk factors, and family history of disease. Counseling  Your health care provider may ask you questions about your: Alcohol use. Tobacco use. Drug use. Emotional well-being. Home and relationship well-being. Sexual activity. Eating habits. History of  falls. Memory and ability to understand (cognition). Work and work Astronomer. Screening  You may have the following tests or measurements: Height, weight, and BMI. Blood pressure. Lipid and cholesterol levels. These may be checked every 5 years, or more frequently if you are over 36 years old. Skin check. Lung cancer screening. You may have this screening every year starting at age 45 if you have a 30-pack-year history of smoking and currently smoke or have quit within the past 15 years. Fecal occult blood test (FOBT) of the stool. You may have this test every year starting at age 75. Flexible sigmoidoscopy or colonoscopy. You may have a sigmoidoscopy every 5 years or a colonoscopy every 10 years starting at age 5. Prostate cancer screening. Recommendations will vary depending on your family history and other risks. Hepatitis C blood test. Hepatitis B blood test. Sexually transmitted disease (STD) testing. Diabetes screening. This is done by checking your blood sugar (glucose) after you have not eaten for a while (fasting). You may have this done every 1-3 years. Abdominal aortic aneurysm (AAA) screening. You may need this if you are a current or former smoker. Osteoporosis. You may be screened starting at age 38 if you are at high risk. Talk with your health care provider about your test results, treatment options, and if necessary, the need for more tests. Vaccines  Your health care provider may recommend certain vaccines, such as: Influenza vaccine. This is recommended every year. Tetanus, diphtheria, and acellular pertussis (Tdap, Td) vaccine. You may need a Td booster every 10 years. Zoster vaccine. You  may need this after age 67. Pneumococcal 13-valent conjugate (PCV13) vaccine. One dose is recommended after age 68. Pneumococcal polysaccharide (PPSV23) vaccine. One dose is recommended after age 16. Talk to your health care provider about which screenings and vaccines you need and  how often you need them. This information is not intended to replace advice given to you by your health care provider. Make sure you discuss any questions you have with your health care provider. Document Released: 05/26/2015 Document Revised: 01/17/2016 Document Reviewed: 02/28/2015 Elsevier Interactive Patient Education  2017 [employer].  Fall Prevention in the Home Falls can cause injuries. They can happen to people of all ages. There are many things you can do to make your home safe and to help prevent falls. What can I do on the outside of my home? Regularly fix the edges of walkways and driveways and fix any cracks. Remove anything that might make you trip as you walk through a door, such as a raised step or threshold. Trim any bushes or trees on the path to your home. Use bright outdoor lighting. Clear any walking paths of anything that might make someone trip, such as rocks or tools. Regularly check to see if handrails are loose or broken. Make sure that both sides of any steps have handrails. Any raised decks and porches should have guardrails on the edges. Have any leaves, snow, or ice cleared regularly. Use sand or salt on walking paths during winter. Clean up any spills in your garage right away. This includes oil or grease spills. What can I do in the bathroom? Use night lights. Install grab bars by the toilet and in the tub and shower. Do not use towel bars as grab bars. Use non-skid mats or decals in the tub or shower. If you need to sit down in the shower, use a plastic, non-slip stool. Keep the floor dry. Clean up any water that spills on the floor as soon as it happens. Remove soap buildup in the tub or shower regularly. Attach bath mats securely with double-sided non-slip rug tape. Do not have throw rugs and other things on the floor that can make you trip. What can I do in the bedroom? Use night lights. Make sure that you have a light by your bed that is easy to  reach. Do not use any sheets or blankets that are too big for your bed. They should not hang down onto the floor. Have a firm chair that has side arms. You can use this for support while you get dressed. Do not have throw rugs and other things on the floor that can make you trip. What can I do in the kitchen? Clean up any spills right away. Avoid walking on wet floors. Keep items that you use a lot in easy-to-reach places. If you need to reach something above you, use a strong step stool that has a grab bar. Keep electrical cords out of the way. Do not use floor polish or wax that makes floors slippery. If you must use wax, use non-skid floor wax. Do not have throw rugs and other things on the floor that can make you trip. What can I do with my stairs? Do not leave any items on the stairs. Make sure that there are handrails on both sides of the stairs and use them. Fix handrails that are broken or loose. Make sure that handrails are as long as the stairways. Check any carpeting to make sure that it is firmly  attached to the stairs. Fix any carpet that is loose or worn. Avoid having throw rugs at the top or bottom of the stairs. If you do have throw rugs, attach them to the floor with carpet tape. Make sure that you have a light switch at the top of the stairs and the bottom of the stairs. If you do not have them, ask someone to add them for you. What else can I do to help prevent falls? Wear shoes that: Do not have high heels. Have rubber bottoms. Are comfortable and fit you well. Are closed at the toe. Do not wear sandals. If you use a stepladder: Make sure that it is fully opened. Do not climb a closed stepladder. Make sure that both sides of the stepladder are locked into place. Ask someone to hold it for you, if possible. Clearly mark and make sure that you can see: Any grab bars or handrails. First and last steps. Where the edge of each step is. Use tools that help you move  around (mobility aids) if they are needed. These include: Canes. Walkers. Scooters. Crutches. Turn on the lights when you go into a dark area. Replace any light bulbs as soon as they burn out. Set up your furniture so you have a clear path. Avoid moving your furniture around. If any of your floors are uneven, fix them. If there are any pets around you, be aware of where they are. Review your medicines with your doctor. Some medicines can make you feel dizzy. This can increase your chance of falling. Ask your doctor what other things that you can do to help prevent falls. This information is not intended to replace advice given to you by your health care provider. Make sure you discuss any questions you have with your health care provider. Document Released: 02/23/2009 Document Revised: 10/05/2015 Document Reviewed: 06/03/2014 Elsevier Interactive Patient Education  2017 [employer].

## 2023-02-20 ENCOUNTER — Other Ambulatory Visit (HOSPITAL_BASED_OUTPATIENT_CLINIC_OR_DEPARTMENT_OTHER): Payer: Self-pay

## 2023-03-01 DIAGNOSIS — Z7982 Long term (current) use of aspirin: Secondary | ICD-10-CM | POA: Insufficient documentation

## 2023-03-01 DIAGNOSIS — K5730 Diverticulosis of large intestine without perforation or abscess without bleeding: Secondary | ICD-10-CM | POA: Diagnosis not present

## 2023-03-01 DIAGNOSIS — J449 Chronic obstructive pulmonary disease, unspecified: Secondary | ICD-10-CM | POA: Diagnosis not present

## 2023-03-01 DIAGNOSIS — R109 Unspecified abdominal pain: Secondary | ICD-10-CM | POA: Diagnosis not present

## 2023-03-01 DIAGNOSIS — N132 Hydronephrosis with renal and ureteral calculous obstruction: Secondary | ICD-10-CM | POA: Diagnosis not present

## 2023-03-01 DIAGNOSIS — Z79899 Other long term (current) drug therapy: Secondary | ICD-10-CM | POA: Insufficient documentation

## 2023-03-01 DIAGNOSIS — N200 Calculus of kidney: Secondary | ICD-10-CM | POA: Diagnosis not present

## 2023-03-01 DIAGNOSIS — I10 Essential (primary) hypertension: Secondary | ICD-10-CM | POA: Diagnosis not present

## 2023-03-01 DIAGNOSIS — R9431 Abnormal electrocardiogram [ECG] [EKG]: Secondary | ICD-10-CM | POA: Diagnosis not present

## 2023-03-01 NOTE — ED Triage Notes (Signed)
Pt reports LLQ abd pain and vomiting for about 3 hours. He reports only 1 "extended" episode of emesis. Denies SHOB, chest pain. Reports generalized weakness. Hypertensive on arrival. Pt baseline pressures 120s/60s. In triage 200s/90-100s.

## 2023-03-02 ENCOUNTER — Emergency Department (HOSPITAL_BASED_OUTPATIENT_CLINIC_OR_DEPARTMENT_OTHER)
Admission: EM | Admit: 2023-03-02 | Discharge: 2023-03-02 | Disposition: A | Discharge status: Home / Self Care | Payer: PPO | Attending: Emergency Medicine | Admitting: Emergency Medicine

## 2023-03-02 ENCOUNTER — Other Ambulatory Visit: Payer: Self-pay

## 2023-03-02 ENCOUNTER — Emergency Department (HOSPITAL_BASED_OUTPATIENT_CLINIC_OR_DEPARTMENT_OTHER): Payer: PPO

## 2023-03-02 ENCOUNTER — Encounter (HOSPITAL_BASED_OUTPATIENT_CLINIC_OR_DEPARTMENT_OTHER): Payer: Self-pay | Admitting: Emergency Medicine

## 2023-03-02 DIAGNOSIS — N200 Calculus of kidney: Secondary | ICD-10-CM

## 2023-03-02 DIAGNOSIS — R109 Unspecified abdominal pain: Secondary | ICD-10-CM | POA: Diagnosis not present

## 2023-03-02 DIAGNOSIS — N132 Hydronephrosis with renal and ureteral calculous obstruction: Secondary | ICD-10-CM | POA: Diagnosis not present

## 2023-03-02 DIAGNOSIS — K5730 Diverticulosis of large intestine without perforation or abscess without bleeding: Secondary | ICD-10-CM | POA: Diagnosis not present

## 2023-03-02 LAB — COMPREHENSIVE METABOLIC PANEL
ALT: 21 U/L (ref 0–44)
AST: 25 U/L (ref 15–41)
Albumin: 4 g/dL (ref 3.5–5.0)
Alkaline Phosphatase: 67 U/L (ref 38–126)
Anion gap: 10 (ref 5–15)
BUN: 15 mg/dL (ref 8–23)
CO2: 26 mmol/L (ref 22–32)
Calcium: 8.7 mg/dL — ABNORMAL LOW (ref 8.9–10.3)
Chloride: 97 mmol/L — ABNORMAL LOW (ref 98–111)
Creatinine, Ser: 1.2 mg/dL (ref 0.61–1.24)
GFR, Estimated: >60 mL/min (ref >60)
Glucose, Bld: 154 mg/dL — ABNORMAL HIGH (ref 70–99)
Potassium: 3.8 mmol/L (ref 3.5–5.1)
Sodium: 133 mmol/L — ABNORMAL LOW (ref 135–145)
Total Bilirubin: 0.5 mg/dL (ref 0.3–1.2)
Total Protein: 7.6 g/dL (ref 6.5–8.1)

## 2023-03-02 LAB — CBC WITH DIFFERENTIAL/PLATELET
Abs Immature Granulocytes: 0.77 10*3/uL — ABNORMAL HIGH (ref 0.00–0.07)
Basophils Absolute: 0 10*3/uL (ref 0.0–0.1)
Basophils Relative: 0 %
Eosinophils Absolute: 0.1 10*3/uL (ref 0.0–0.5)
Eosinophils Relative: 2 %
HCT: 39.6 % (ref 39.0–52.0)
Hemoglobin: 13.5 g/dL (ref 13.0–17.0)
Immature Granulocytes: 11 %
Lymphocytes Relative: 17 %
Lymphs Abs: 1.2 10*3/uL (ref 0.7–4.0)
MCH: 31 pg (ref 26.0–34.0)
MCHC: 34.1 g/dL (ref 30.0–36.0)
MCV: 91 fL (ref 80.0–100.0)
Monocytes Absolute: 0.6 10*3/uL (ref 0.1–1.0)
Monocytes Relative: 9 %
Neutro Abs: 4.2 10*3/uL (ref 1.7–7.7)
Neutrophils Relative %: 61 %
Platelets: 215 10*3/uL (ref 150–400)
RBC: 4.35 MIL/uL (ref 4.22–5.81)
RDW: 12.8 % (ref 11.5–15.5)
Smear Review: NORMAL
WBC: 6.9 10*3/uL (ref 4.0–10.5)
nRBC: 0 % (ref 0.0–0.2)

## 2023-03-02 LAB — URINALYSIS, ROUTINE W REFLEX MICROSCOPIC
Bilirubin Urine: NEGATIVE
Glucose, UA: NEGATIVE mg/dL
Ketones, ur: NEGATIVE mg/dL
Leukocytes,Ua: NEGATIVE
Nitrite: NEGATIVE
Protein, ur: 30 mg/dL — AB
Specific Gravity, Urine: 1.02 (ref 1.005–1.030)
pH: 5 (ref 5.0–8.0)

## 2023-03-02 LAB — URINALYSIS, MICROSCOPIC (REFLEX): RBC / HPF: >50 RBC/hpf (ref 0–5)

## 2023-03-02 MED ORDER — ONDANSETRON 8 MG PO TBDP: ORAL_TABLET | ORAL | 0 refills | Status: DC

## 2023-03-02 MED ORDER — DICLOFENAC SODIUM ER 100 MG PO TB24: 100.0000 mg | ORAL_TABLET | Freq: Every day | ORAL | 0 refills | Status: DC

## 2023-03-02 MED ORDER — TAMSULOSIN HCL 0.4 MG PO CAPS: 0.4000 mg | ORAL_CAPSULE | Freq: Every day | ORAL | 0 refills | Status: DC

## 2023-03-02 MED ORDER — AMLODIPINE BESYLATE 5 MG PO TABS
5.0000 mg | ORAL_TABLET | Freq: Once | ORAL | Status: AC
  Administered 2023-03-02: 5 mg via ORAL
  Filled 2023-03-02: qty 1

## 2023-03-02 MED ORDER — ONDANSETRON HCL 4 MG/2ML IJ SOLN
4.0000 mg | Freq: Once | INTRAMUSCULAR | Status: AC
  Administered 2023-03-02: 4 mg via INTRAVENOUS
  Filled 2023-03-02: qty 2

## 2023-03-02 MED ORDER — MAGNESIUM SULFATE 2 GM/50ML IV SOLN
2.0000 g | Freq: Once | INTRAVENOUS | Status: AC
  Administered 2023-03-02: 2 g via INTRAVENOUS
  Filled 2023-03-02: qty 50

## 2023-03-02 MED ORDER — KETOROLAC TROMETHAMINE 30 MG/ML IJ SOLN
15.0000 mg | Freq: Once | INTRAMUSCULAR | Status: AC
Start: 2023-03-02 — End: 2023-03-02
  Administered 2023-03-02: 15 mg via INTRAVENOUS
  Filled 2023-03-02: qty 1

## 2023-03-02 MED ORDER — TAMSULOSIN HCL 0.4 MG PO CAPS
0.4000 mg | ORAL_CAPSULE | ORAL | Status: AC
  Administered 2023-03-02: 0.4 mg via ORAL
  Filled 2023-03-02: qty 1

## 2023-03-02 NOTE — ED Provider Notes (Signed)
[name] Provider Note   CSN: [id number] Arrival date & time: 03/01/23  2344     History  Chief Complaint  Patient presents with   Abdominal Pain    Kevin [name] is a 77 y.o. male.  The history is provided by the patient.  Abdominal Pain Pain location:  L flank Pain quality: shooting   Pain severity:  Moderate Onset quality:  Sudden Timing:  Constant Progression:  Waxing and waning Chronicity:  New Context: not trauma   Relieved by:  Nothing Worsened by:  Nothing Ineffective treatments:  None tried Associated symptoms: nausea and vomiting   Associated symptoms: no fever   Risk factors: being elderly   Patient with COPD with left flank pain that started this evening with associated nausea and emesis.  No urinary complaints.      Past Medical History:  Diagnosis Date   COPD (chronic obstructive pulmonary disease) (HCC)    PFTs 01-2006 mild obstruction, (+) reponse to Bronchodilators     GERD (gastroesophageal reflux disease)    with certain foods   Hyperlipidemia    on meds   Hypertension    on meds   Seasonal allergies    Shingles    Thorax, 2008   Thyroid disease    on meds     Home Medications Prior to Admission medications   Medication Sig Start Date End Date Taking? Authorizing Provider  Diclofenac Sodium CR 100 MG 24 hr tablet Take 1 tablet (100 mg total) by mouth daily. 03/02/23  Yes Tyus Kallam, MD  ondansetron (ZOFRAN-ODT) 8 MG disintegrating tablet 8mg  ODT q8 hours prn nausea 03/02/23  Yes Ghassan Coggeshall, MD  tamsulosin (FLOMAX) 0.4 MG CAPS capsule Take 1 capsule (0.4 mg total) by mouth daily. 03/02/23  Yes Keila Turan, MD  acetaminophen (TYLENOL) 500 MG tablet Take 500 mg by mouth every 6 (six) hours as needed.    [provider]  aspirin EC 81 MG tablet Take 81 mg by mouth daily. Swallow whole.    [provider]  calcium carbonate (OS-CAL) 600 MG TABS tablet Take 600 mg  by mouth at bedtime.     [provider]  cefadroxil (DURICEF) 500 MG capsule Take 1 capsule (500 mg total) by mouth 2 (two) times daily. 01/31/23   Wanda Plump, MD  Cyanocobalamin (VITAMIN B 12 PO) Take 1,000 mcg by mouth 3 (three) times a week.    [provider]  Providence Lanius 500 MG CAPS Take 1 capsule by mouth daily.    [provider]  levothyroxine (SYNTHROID) 88 MCG tablet Take 1 tablet (88 mcg total) by mouth daily before breakfast. 10/01/22   Wanda Plump, MD  metoprolol succinate (TOPROL-XL) 50 MG 24 hr tablet Take 1 tablet (50 mg total) by mouth daily. 11/21/22   Wanda Plump, MD  Multiple Vitamins-Minerals (MULTIVITAMIN PO) Take 1 tablet by mouth daily. In the evening    [provider]  simvastatin (ZOCOR) 40 MG tablet Take 1 tablet (40 mg total) by mouth daily at 6 PM. 02/05/23   Wanda Plump, MD  Turmeric 500 MG CAPS Take 500 mg by mouth daily.    [provider]  Zinc 50 MG TABS Take 50 mg by mouth daily.    [provider]      Allergies    Tiotropium bromide monohydrate and Bromide ion [bromine]    Review of Systems   Review of Systems  Constitutional:  Negative for fever.  HENT:  Negative for facial swelling.   Respiratory:  Negative for wheezing and stridor.   Gastrointestinal:  Positive for nausea and vomiting.  Genitourinary:  Positive for flank pain.    Physical Exam Updated Vital Signs BP (!) 150/71   Pulse 64   Temp (!) 97.5 F (36.4 C) (Oral)   Resp 16   Ht 5\' 11"  (1.803 m)   Wt 79.4 kg   SpO2 97%   BMI 24.41 kg/m  Physical Exam Vitals and nursing note reviewed.  Constitutional:      General: He is not in acute distress.    Appearance: Normal appearance. He is well-developed. He is not diaphoretic.  HENT:     Head: Normocephalic and atraumatic.     Nose: Nose normal.  Eyes:     Conjunctiva/sclera: Conjunctivae normal.     Pupils: Pupils are equal, round, and reactive to light.  Cardiovascular:      Rate and Rhythm: Normal rate and regular rhythm.     Pulses: Normal pulses.     Heart sounds: Normal heart sounds.  Pulmonary:     Effort: Pulmonary effort is normal.     Breath sounds: Normal breath sounds. No wheezing or rales.  Abdominal:     General: Bowel sounds are normal.     Palpations: Abdomen is soft.     Tenderness: There is no abdominal tenderness. There is no guarding or rebound.  Musculoskeletal:        General: Normal range of motion.     Cervical back: Normal range of motion and neck supple.  Skin:    General: Skin is warm and dry.     Capillary Refill: Capillary refill takes less than 2 seconds.  Neurological:     General: No focal deficit present.     Mental Status: He is alert and oriented to person, place, and time.  Psychiatric:        Mood and Affect: Mood normal.     ED Results / Procedures / Treatments   Labs (all labs ordered are listed, but only abnormal results are displayed) Results for orders placed or performed during the hospital encounter of 03/02/23  CBC with Differential  Result Value Ref Range   WBC 6.9 4.0 - 10.5 K/uL   RBC 4.35 4.22 - 5.81 MIL/uL   Hemoglobin 13.5 13.0 - 17.0 g/dL   HCT 16.1 09.6 - 04.5 %   MCV 91.0 80.0 - 100.0 fL   MCH 31.0 26.0 - 34.0 pg   MCHC 34.1 30.0 - 36.0 g/dL   RDW 40.9 81.1 - 91.4 %   Platelets 215 150 - 400 K/uL   nRBC 0.0 0.0 - 0.2 %   Neutrophils Relative % 61 %   Neutro Abs 4.2 1.7 - 7.7 K/uL   Lymphocytes Relative 17 %   Lymphs Abs 1.2 0.7 - 4.0 K/uL   Monocytes Relative 9 %   Monocytes Absolute 0.6 0.1 - 1.0 K/uL   Eosinophils Relative 2 %   Eosinophils Absolute 0.1 0.0 - 0.5 K/uL   Basophils Relative 0 %   Basophils Absolute 0.0 0.0 - 0.1 K/uL   WBC Morphology MORPHOLOGY UNREMARKABLE    RBC Morphology MORPHOLOGY UNREMARKABLE    Smear Review Normal platelet morphology    Immature Granulocytes 11 %   Abs Immature Granulocytes 0.77 (H) 0.00 - 0.07 K/uL  Comprehensive metabolic panel  Result  Value Ref Range   Sodium 133 (L) 135 - 145  mmol/L   Potassium 3.8 3.5 - 5.1 mmol/L   Chloride 97 (L) 98 - 111 mmol/L   CO2 26 22 - 32 mmol/L   Glucose, Bld 154 (H) 70 - 99 mg/dL   BUN 15 8 - 23 mg/dL   Creatinine, Ser 4.09 0.61 - 1.24 mg/dL   Calcium 8.7 (L) 8.9 - 10.3 mg/dL   Total Protein 7.6 6.5 - 8.1 g/dL   Albumin 4.0 3.5 - 5.0 g/dL   AST 25 15 - 41 U/L   ALT 21 0 - 44 U/L   Alkaline Phosphatase 67 38 - 126 U/L   Total Bilirubin 0.5 0.3 - 1.2 mg/dL   GFR, Estimated >81 >19 mL/min   Anion gap 10 5 - 15  Urinalysis, Routine w reflex microscopic -Urine, Clean Catch  Result Value Ref Range   Color, Urine YELLOW YELLOW   APPearance CLOUDY (A) CLEAR   Specific Gravity, Urine 1.020 1.005 - 1.030   pH 5.0 5.0 - 8.0   Glucose, UA NEGATIVE NEGATIVE mg/dL   Hgb urine dipstick LARGE (A) NEGATIVE   Bilirubin Urine NEGATIVE NEGATIVE   Ketones, ur NEGATIVE NEGATIVE mg/dL   Protein, ur 30 (A) NEGATIVE mg/dL   Nitrite NEGATIVE NEGATIVE   Leukocytes,Ua NEGATIVE NEGATIVE  Urinalysis, Microscopic (reflex)  Result Value Ref Range   RBC / HPF >50 0 - 5 RBC/hpf   WBC, UA 0-5 0 - 5 WBC/hpf   Bacteria, UA RARE (A) NONE SEEN   Squamous Epithelial / HPF 0-5 0 - 5 /HPF   Mucus PRESENT    Hyaline Casts, UA PRESENT    CT Renal Stone Study  Result Date: 03/02/2023 CLINICAL DATA:  Abdominal/flank pain, stone suspected EXAM: CT ABDOMEN AND PELVIS WITHOUT CONTRAST TECHNIQUE: Multidetector CT imaging of the abdomen and pelvis was performed following the standard protocol without IV contrast. RADIATION DOSE REDUCTION: This exam was performed according to the departmental dose-optimization program which includes automated exposure control, adjustment of the mA and/or kV according to patient size and/or use of iterative reconstruction technique. COMPARISON:  None Available. FINDINGS: Lower chest:  No contributory findings. Hepatobiliary: No focal liver abnormality.No evidence of biliary obstruction or  stone. Pancreas: Unremarkable. Spleen: Unremarkable. Adrenals/Urinary Tract: Negative adrenals. 5 mm distal left ureteral calculus causing mild hydronephrosis and asymmetric perinephric stranding. 6 mm stone in the upper pole right kidney. No right hydronephrosis. Punctate left lower pole calculus. Unremarkable bladder. Stomach/Bowel: No obstruction. No visible bowel inflammation. Multiple distal colonic diverticula. Vascular/Lymphatic: No acute vascular abnormality. Extensive atheromatous calcification of the aorta and iliacs. No mass or adenopathy. Reproductive:No pathologic findings. Other: No ascites or pneumoperitoneum. Musculoskeletal: No acute abnormalities. Generalized lumbar spine degeneration with mild scoliosis. IMPRESSION: 1. Left hydronephrosis from a 4 mm distal ureteral calculus. 2. Bilateral nephrolithiasis. 3. Extensive atherosclerosis. Electronically Signed   By: Tiburcio Pea M.D.   On: 03/02/2023 03:56    EKG EKG Interpretation Date/Time:  Saturday March 01 2023 23:55:28 EDT Ventricular Rate:  70 PR Interval:  203 QRS Duration:  85 QT Interval:  406 QTC Calculation: 439 R Axis:   92  Text Interpretation: Sinus rhythm Confirmed by Nicanor Alcon, Rosella Crandell ([id number]) on 03/02/2023 12:16:53 AM  Radiology CT Renal Stone Study  Result Date: 03/02/2023 CLINICAL DATA:  Abdominal/flank pain, stone suspected EXAM: CT ABDOMEN AND PELVIS WITHOUT CONTRAST TECHNIQUE: Multidetector CT imaging of the abdomen and pelvis was performed following the standard protocol without IV contrast. RADIATION DOSE REDUCTION: This exam was performed according to the departmental dose-optimization program which  includes automated exposure control, adjustment of the mA and/or kV according to patient size and/or use of iterative reconstruction technique. COMPARISON:  None Available. FINDINGS: Lower chest:  No contributory findings. Hepatobiliary: No focal liver abnormality.No evidence of biliary obstruction or stone.  Pancreas: Unremarkable. Spleen: Unremarkable. Adrenals/Urinary Tract: Negative adrenals. 5 mm distal left ureteral calculus causing mild hydronephrosis and asymmetric perinephric stranding. 6 mm stone in the upper pole right kidney. No right hydronephrosis. Punctate left lower pole calculus. Unremarkable bladder. Stomach/Bowel: No obstruction. No visible bowel inflammation. Multiple distal colonic diverticula. Vascular/Lymphatic: No acute vascular abnormality. Extensive atheromatous calcification of the aorta and iliacs. No mass or adenopathy. Reproductive:No pathologic findings. Other: No ascites or pneumoperitoneum. Musculoskeletal: No acute abnormalities. Generalized lumbar spine degeneration with mild scoliosis. IMPRESSION: 1. Left hydronephrosis from a 4 mm distal ureteral calculus. 2. Bilateral nephrolithiasis. 3. Extensive atherosclerosis. Electronically Signed   By: Tiburcio Pea M.D.   On: 03/02/2023 03:56    Procedures Procedures    Medications Ordered in ED Medications  magnesium sulfate IVPB 2 g 50 mL (has no administration in time range)  tamsulosin (FLOMAX) capsule 0.4 mg (has no administration in time range)  ondansetron (ZOFRAN) injection 4 mg (4 mg Intravenous Given 03/02/23 0034)  ketorolac (TORADOL) 30 MG/ML injection 15 mg (15 mg Intravenous Given 03/02/23 0126)  amLODipine (NORVASC) tablet 5 mg (5 mg Oral Given 03/02/23 0126)    ED Course/ Medical Decision Making/ A&P                                 Medical Decision Making Patient with left flank pain and nausea and vomiting   Amount and/or Complexity of Data Reviewed Independent Historian: spouse    Details: See above  External Data Reviewed: notes.    Details: Previous notes reviewed  Labs: ordered.    Details: Urine has hematuria no UTI. Normal white count 6.9, normal hemoglobin 13.5, normal platelet count. Sodium slight low 133, normal potassium 3.8.  Creatinine normal 1.2  Radiology:  ordered.  Risk Prescription drug management. Risk Details: Patient with a kidney stone.  Strain all urine.  Take medication as prescribed.  Call Monday am to be seen in close follow up by urology.  Stable for discharge.  Strict return.      Final Clinical Impression(s) / ED Diagnoses Final diagnoses:  Kidney stone   Return for intractable cough, coughing up blood, fevers > 100.4 unrelieved by medication, shortness of breath, intractable vomiting, chest pain, shortness of breath, weakness, numbness, changes in speech, facial asymmetry, abdominal pain, passing out, Inability to tolerate liquids or food, cough, altered mental status or any concerns. No signs of systemic illness or infection. The patient is nontoxic-appearing on exam and vital signs are within normal limits.  I have reviewed the triage vital signs and the nursing notes. Pertinent labs & imaging results that were available during my care of the patient were reviewed by me and considered in my medical decision making (see chart for details). After history, exam, and medical workup I feel the patient has been appropriately medically screened and is safe for discharge home. Pertinent diagnoses were discussed with the patient. Patient was given return precautions.    Rx / DC Orders ED Discharge Orders          Ordered    Diclofenac Sodium CR 100 MG 24 hr tablet  Daily        03/02/23 0402  tamsulosin (FLOMAX) 0.4 MG CAPS capsule  Daily        03/02/23 0402    ondansetron (ZOFRAN-ODT) 8 MG disintegrating tablet        03/02/23 0402              Kylil Swopes, MD 03/02/23 4401

## 2023-03-07 DIAGNOSIS — N201 Calculus of ureter: Secondary | ICD-10-CM | POA: Diagnosis not present

## 2023-03-25 ENCOUNTER — Other Ambulatory Visit: Payer: Self-pay | Admitting: Internal Medicine

## 2023-04-22 ENCOUNTER — Ambulatory Visit (INDEPENDENT_AMBULATORY_CARE_PROVIDER_SITE_OTHER): Payer: PPO | Admitting: Internal Medicine

## 2023-04-22 ENCOUNTER — Encounter: Payer: Self-pay | Admitting: Internal Medicine

## 2023-04-22 VITALS — BP 136/82 | HR 61 | Temp 97.6°F | Resp 16 | Ht 71.0 in | Wt 167.2 lb

## 2023-04-22 DIAGNOSIS — I2510 Atherosclerotic heart disease of native coronary artery without angina pectoris: Secondary | ICD-10-CM | POA: Diagnosis not present

## 2023-04-22 DIAGNOSIS — Z0001 Encounter for general adult medical examination with abnormal findings: Secondary | ICD-10-CM

## 2023-04-22 DIAGNOSIS — E039 Hypothyroidism, unspecified: Secondary | ICD-10-CM | POA: Diagnosis not present

## 2023-04-22 DIAGNOSIS — J410 Simple chronic bronchitis: Secondary | ICD-10-CM | POA: Diagnosis not present

## 2023-04-22 DIAGNOSIS — I2583 Coronary atherosclerosis due to lipid rich plaque: Secondary | ICD-10-CM

## 2023-04-22 DIAGNOSIS — E782 Mixed hyperlipidemia: Secondary | ICD-10-CM

## 2023-04-22 DIAGNOSIS — I10 Essential (primary) hypertension: Secondary | ICD-10-CM | POA: Diagnosis not present

## 2023-04-22 DIAGNOSIS — Z0000 Encounter for general adult medical examination without abnormal findings: Secondary | ICD-10-CM | POA: Diagnosis not present

## 2023-04-22 LAB — BASIC METABOLIC PANEL
BUN: 12 mg/dL (ref 6–23)
CO2: 30 meq/L (ref 19–32)
Calcium: 9.3 mg/dL (ref 8.4–10.5)
Chloride: 101 meq/L (ref 96–112)
Creatinine, Ser: 0.97 mg/dL (ref 0.40–1.50)
GFR: 74.25 mL/min (ref >60.00)
Glucose, Bld: 98 mg/dL (ref 70–99)
Potassium: 5.1 meq/L (ref 3.5–5.1)
Sodium: 139 meq/L (ref 135–145)

## 2023-04-22 LAB — TSH: TSH: 1.54 u[IU]/mL (ref 0.35–5.50)

## 2023-04-22 LAB — LIPID PANEL
Cholesterol: 218 mg/dL — ABNORMAL HIGH (ref 0–200)
HDL: 47.7 mg/dL (ref >39.00)
LDL Cholesterol: 140 mg/dL — ABNORMAL HIGH (ref 0–99)
NonHDL: 170.17
Total CHOL/HDL Ratio: 5
Triglycerides: 152 mg/dL — ABNORMAL HIGH (ref 0.0–149.0)
VLDL: 30.4 mg/dL (ref 0.0–40.0)

## 2023-04-22 LAB — HEMOGLOBIN A1C: Hgb A1c MFr Bld: 5.9 % (ref 4.6–6.5)

## 2023-04-22 NOTE — Progress Notes (Unsigned)
Subjective:    Patient ID: Kevin [name], male    DOB: 1945-10-25, 77 y.o.   MRN: [id number]  DOS:  04/22/2023 Type of visit - description: cpx  Here for CPX Chronic medical problems addressed. Also went to the ER in October, diagnosed with a kidney stone, since then he is asymptomatic, specifically denies flank pain, blood in the urine, fever or chills. Ambulatory BPs when checked and well within normal.   Review of Systems See above   Past Medical History:  Diagnosis Date   COPD (chronic obstructive pulmonary disease) (HCC)    PFTs 01-2006 mild obstruction, (+) reponse to Bronchodilators     GERD (gastroesophageal reflux disease)    with certain foods   Hyperlipidemia    on meds   Hypertension    on meds   Seasonal allergies    Shingles    Thorax, 2008   Thyroid disease    on meds    Past Surgical History:  Procedure Laterality Date   COLONOSCOPY  11/16/2008   Jarold Motto   COLONOSCOPY  01/2019   HD-MAC-good prep-tics/TA x 8- 33yr recall   DENTAL SURGERY  2015   upper dentures   POLYPECTOMY  2020   TA x 8    Current Outpatient Medications  Medication Instructions   acetaminophen (TYLENOL) 500 mg, Oral, Every 6 hours PRN   aspirin EC 81 mg, Oral, Daily, Swallow whole.   calcium carbonate (OS-CAL) 600 mg, Oral, Daily at bedtime   cefadroxil (DURICEF) 500 mg, Oral, 2 times daily   Cyanocobalamin (VITAMIN B 12 PO) 1,000 mcg, Oral, 3 times weekly   Diclofenac Sodium CR 100 mg, Oral, Daily   Krill Oil 500 MG CAPS 1 capsule, Oral, Daily   levothyroxine (SYNTHROID) 88 mcg, Oral, Daily before breakfast   metoprolol succinate (TOPROL-XL) 50 mg, Oral, Daily   Multiple Vitamins-Minerals (MULTIVITAMIN PO) 1 tablet, Oral, Daily, In the evening   ondansetron (ZOFRAN-ODT) 8 MG disintegrating tablet 8mg  ODT q8 hours prn nausea   simvastatin (ZOCOR) 40 mg, Oral, Daily-1800   tamsulosin (FLOMAX) 0.4 mg, Oral, Daily   Turmeric 500 mg, Oral, Daily   Zinc 50 mg, Oral, Daily        Objective:   Physical Exam BP 136/82   Pulse 61   Temp 97.6 F (36.4 C) (Oral)   Resp 16   Ht 5\' 11"  (1.803 m)   Wt 167 lb 4 oz (75.9 kg)   SpO2 97%   BMI 23.33 kg/m  General: Well developed, NAD, BMI noted Neck: No  thyromegaly  HEENT:  Normocephalic . Face symmetric, atraumatic Lungs:  CTA B Normal respiratory effort, no intercostal retractions, no accessory muscle use. Heart: RRR,  no murmur.  Abdomen:  Not distended, soft, non-tender. No rebound or rigidity.   Lower extremities: no pretibial edema bilaterally  Skin: Exposed areas without rash. Not pale. Not jaundice Neurologic:  alert & oriented X3.  Speech normal, gait appropriate for age and unassisted Strength symmetric and appropriate for age.  Psych: Cognition and judgment appear intact.  Cooperative with normal attention span and concentration.  Behavior appropriate. No anxious or depressed appearing.     Assessment   Assessment Hyperglycemia HTN Hypothyroidism Hyperlipidemia LUTS, urolithiasis 1st episode 02-2023  COPD: PFTs 2007 mild obstruction, + response to bronchodilators, h/o multiple inhalers intolerance  Shingles 2008, chest. CAD per CT 2020 (continue aspirin) ETOH in the past  Skin Cancer    PLAN Here for CPX - Td:2020 - pneumonia shot 12-08 ,  2014;  prevnar 03-2014.    -  zostavax :2015; s/p shingrex. -had flu shot -Recommend to consider RSV, COVID booster, PNM 20  -Cscope x 2  (Dr Doreatha Martin) , Cscope 11-2008 negative, colonoscopy 01-2019, colonoscopy 03-2022 next per GI -Prostate cancer screening:  no further screening -Labs: See orders -Lung cancer screening: See previous entries, elected no further CTs -Lifestyle discussed, doing well, goes to the [facility] regularly - Healthcare POA: Information provided. Hyperglycemia: Has a healthy lifestyle, check A1c HTN: Ambulatory BP in the 120s over 80, continue metoprolol, check BMP Hypothyroidism: On Synthroid, check a TSH, further  advised for results. Hyperlipidemia: On simvastatin, check labs. COPD, history of, asymptomatic CAD: Per CT, no symptoms, on aspirin. Skin cancer: To have Mohs surgery soon, follow-up by dermatology Urolithiasis: New onset 02-2023, currently asymptomatic, not needing Flomax, Zofran, diclofenac.  To see urology soon.  RTC 6 months  === Surgical wound infection: 2 days ago, he saw pus coming from the area, is getting better now, to be safe will prescribe antibiotics x 5 days, see prescription.  Recommend local care with soap, water and hydrocortisone 1%. Call if not gradually better

## 2023-04-22 NOTE — Patient Instructions (Addendum)
Vaccines to consider: RSV, COVID booster, pneumonia shot (PNM 20).  Continue checking your blood pressure regularly Blood pressure goal:  between 110/65 and  135/85. If it is consistently higher or lower, let me know     GO TO THE LAB : Get the blood work     Next visit with me in 6 months Please schedule it at the front desk     "Health Care Power of attorney" ,  "Living will" (Advance care planning documents)  If you already have a living will or healthcare power of attorney, is recommended you bring the copy to be scanned in your chart.   The document will be available to all the doctors you see in the system.  Advance care planning is a process that supports adults in  understanding and sharing their preferences regarding future medical care.  The patient's preferences are recorded in documents called Advance Directives and the can be modified at any time while the patient is in full mental capacity.   If you don't have one, please consider create one.      More information at: [email or web address]

## 2023-04-23 ENCOUNTER — Encounter: Payer: Self-pay | Admitting: Internal Medicine

## 2023-04-23 DIAGNOSIS — I2510 Atherosclerotic heart disease of native coronary artery without angina pectoris: Secondary | ICD-10-CM | POA: Insufficient documentation

## 2023-04-23 NOTE — Assessment & Plan Note (Signed)
Here for CPX Hyperglycemia: Has a healthy lifestyle, check A1c HTN: Ambulatory BP in the 120s over 80, continue metoprolol, check BMP Hypothyroidism: On Synthroid, check a TSH, further advised for results. Hyperlipidemia: On simvastatin, check labs. COPD, h/o asx CAD: Per CT, no symptoms, on aspirin. Skin cancer: To have Mohs surgery soon, follow-up by dermatology Urolithiasis: New onset 02-2023, currently asymptomatic, not needing/not takin Flomax, Zofran, diclofenac.  To see urology soon. RTC 6 months

## 2023-04-23 NOTE — Assessment & Plan Note (Signed)
Here for CPX - Td:2020 - pneumonia shot 12-08 , 2014;  prevnar 03-2014.    -  zostavax :2015; s/p shingrex. -had flu shot -Recommend to consider RSV, COVID booster, PNM 20 -Cscope x 2  (Dr Doreatha Martin) , Cscope 11-2008 negative, colonoscopy 01-2019, colonoscopy 03-2022 next per GI -Prostate cancer screening:  no further screening -Labs: See orders -Lung cancer screening: See previous entries, elected no further CTs -Lifestyle discussed, doing well, goes to the [facility] regularly - Healthcare POA: Information provided.

## 2023-04-23 NOTE — Addendum Note (Signed)
Addended byConrad Fruitdale D on: 04/23/2023 03:59 PM   Modules accepted: Orders

## 2023-04-28 ENCOUNTER — Telehealth: Payer: Self-pay | Admitting: Internal Medicine

## 2023-04-28 MED ORDER — LEVOTHYROXINE SODIUM 88 MCG PO TABS: 88.0000 ug | ORAL_TABLET | Freq: Every day | ORAL | 1 refills | Status: DC

## 2023-04-28 NOTE — Addendum Note (Signed)
Addended byConrad Reliance D on: 04/28/2023 09:22 AM   Modules accepted: Orders

## 2023-04-28 NOTE — Telephone Encounter (Signed)
Prescription Request  04/28/2023  Is this a "Controlled Substance" medicine? No  LOV: 04/22/2023  What is the name of the medication or equipment? levothyroxine (SYNTHROID) 88 MCG tablet  Have you contacted your pharmacy to request a refill? No   Which pharmacy would you like this sent to?  [id number] [address], [place] - [address] [place] [address] Phone: [phone] Fax: [phone]    Patient notified that their request is being sent to the clinical staff for review and that they should receive a response within 2 business days.   Please advise at [facility] [phone]

## 2023-04-28 NOTE — Telephone Encounter (Signed)
Rx sent 

## 2023-05-14 ENCOUNTER — Other Ambulatory Visit: Payer: Self-pay | Admitting: Internal Medicine

## 2023-06-04 ENCOUNTER — Other Ambulatory Visit: Payer: PPO

## 2023-06-09 ENCOUNTER — Other Ambulatory Visit (INDEPENDENT_AMBULATORY_CARE_PROVIDER_SITE_OTHER): Payer: PPO

## 2023-06-09 DIAGNOSIS — E782 Mixed hyperlipidemia: Secondary | ICD-10-CM

## 2023-06-09 LAB — LIPID PANEL
Cholesterol: 151 mg/dL (ref 0–200)
HDL: 55 mg/dL (ref >39.00)
LDL Cholesterol: 70 mg/dL (ref 0–99)
NonHDL: 95.5
Total CHOL/HDL Ratio: 3
Triglycerides: 129 mg/dL (ref 0.0–149.0)
VLDL: 25.8 mg/dL (ref 0.0–40.0)

## 2023-06-09 LAB — ALT: ALT: 24 U/L (ref 0–53)

## 2023-06-09 LAB — AST: AST: 21 U/L (ref 0–37)

## 2023-06-11 DIAGNOSIS — H0102A Squamous blepharitis right eye, upper and lower eyelids: Secondary | ICD-10-CM | POA: Diagnosis not present

## 2023-06-11 DIAGNOSIS — H0102B Squamous blepharitis left eye, upper and lower eyelids: Secondary | ICD-10-CM | POA: Diagnosis not present

## 2023-06-11 DIAGNOSIS — H1045 Other chronic allergic conjunctivitis: Secondary | ICD-10-CM | POA: Diagnosis not present

## 2023-06-12 ENCOUNTER — Encounter: Payer: Self-pay | Admitting: Internal Medicine

## 2023-07-10 DIAGNOSIS — D0462 Carcinoma in situ of skin of left upper limb, including shoulder: Secondary | ICD-10-CM | POA: Diagnosis not present

## 2023-07-10 DIAGNOSIS — D044 Carcinoma in situ of skin of scalp and neck: Secondary | ICD-10-CM | POA: Diagnosis not present

## 2023-07-25 ENCOUNTER — Other Ambulatory Visit: Payer: Self-pay | Admitting: Internal Medicine

## 2023-07-25 NOTE — Telephone Encounter (Unsigned)
 Copied from CRM [id number]. Topic: Clinical - Medication Refill >> Jul 25, 2023 10:04 AM Martha Clan wrote: Most Recent Primary Care Visit:  Provider: [facility]  Department: [facility]  Visit Type: LAB  Date: 06/09/2023  Medication: levothyroxine (SYNTHROID) 88 MCG tablet [[id number]]  Has the patient contacted their pharmacy? Yes (Agent: If no, request that the patient contact the pharmacy for the refill. If patient does not wish to contact the pharmacy document the reason why and proceed with request.) (Agent: If yes, when and what did the pharmacy advise?)  Is this the correct pharmacy for this prescription? Yes If no, delete pharmacy and type the correct one.  This is the patient's preferred pharmacy:  [id number] [place] [address], [place] - [address] [place] [address] Phone: [phone] Fax: [phone]   Has the prescription been filled recently? No  Is the patient out of the medication? Yes  Has the patient been seen for an appointment in the last year OR does the patient have an upcoming appointment? Yes  Can we respond through MyChart? Yes, but prefers phone call  Agent: Please be advised that Rx refills may take up to 3 business days. We ask that you follow-up with your pharmacy.

## 2023-07-29 ENCOUNTER — Ambulatory Visit: Payer: Self-pay | Admitting: Internal Medicine

## 2023-07-29 ENCOUNTER — Telehealth: Payer: Self-pay

## 2023-07-29 MED ORDER — LEVOTHYROXINE SODIUM 88 MCG PO TABS: 88.0000 ug | ORAL_TABLET | Freq: Every day | ORAL | 1 refills | Status: DC

## 2023-07-29 NOTE — Telephone Encounter (Signed)
 Refills should be on file, however Rx sent.

## 2023-07-29 NOTE — Telephone Encounter (Signed)
  Chief Complaint: left arm cut  Symptoms: swollen, red, itching, warm to touch  Disposition: [] ED /[] Urgent Care (no appt availability in office) / [x] Appointment(In office/virtual)/ []  [facility]/ [] Home Care/ [] Refused Recommended Disposition /[] [facility]/ []  Follow-up with PCP Additional Notes: Pt called with left arm injury. The door hit pt's left arm below elbow last Tuesday. Pt had 2 scraps, but currently have scabs.  Pt states below scabs the arm has started to swell, become warm to touch, and itches. No fever. Pt is able to use arm. Pt has appt with PCP 3/19 @ 1300. RN gave care advice and pt verbalized understanding.             Copied from CRM [id number]. Topic: Clinical - Red Word Triage >> Jul 29, 2023  3:58 PM Kevin [name] wrote: Red Word that prompted transfer to Nurse Triage: Patient states he bumped his left arm, right below his elbow into the storm door last week Tuesday - cut his arm and it feels warm, swollen and he thinks it may be infected. Reason for Disposition  [1] After 3 days AND [2] pain not improving  Answer Assessment - Initial Assessment Questions 1. MECHANISM: "How did the injury happen?"     Left arm was cut by door. 2. ONSET: "When did the injury happen?" (Minutes or hours ago)      7 days ago  3. LOCATION: "Where is the injury located?" "Which arm?"     Left- below elbow 4. APPEARANCE of INJURY: "What does the injury look like?"      Swollen, red, itching; cuts have scabs 5. SEVERITY: "Can you use the arm normally?"      Yes  6. SWELLING or BRUISING: "is there any swelling or bruising?" If Yes, ask: "How large is it? (e.g., inches, centimeters)      Swelling  7. PAIN: "Is there pain?" If Yes, ask: "How bad is the pain?"    (Scale 1-10; or mild, moderate, severe)   - NONE (0): No pain.   - MILD (1-3): Doesn't interfere with normal activities.   - MODERATE (4-7): Interferes with normal activities (e.g., work or school) or  awakens from sleep.   - SEVERE (8-10): Excruciating pain, unable to do any normal activities, unable to hold [name] cup of water.     None  8. TETANUS: For any breaks in the skin, ask: "When was the last tetanus booster?"     Thinks so  9. OTHER SYMPTOMS: "Do you have any other symptoms?"  (e.g., numbness in hand)     Denies all  Protocols used: Arm Injury-[name]-AH

## 2023-07-29 NOTE — Telephone Encounter (Signed)
 Copied from CRM [id number]. Topic: Clinical - Medication Question >> Jul 25, 2023  3:34 PM Fredrich Romans wrote: Reason for CRM: Patient called in to check on status of medication synthroid being sent In to pharmacy I informed patient of the 3 day turn around time providers are allowed to sent rx into pharmacy. >> Jul 29, 2023  2:28 PM Truddie Crumble wrote: Patient is calling checking on his medication refill on levothyroxine (SYNTHROID) 88 MCG tablet [[id number]]

## 2023-07-30 ENCOUNTER — Ambulatory Visit (INDEPENDENT_AMBULATORY_CARE_PROVIDER_SITE_OTHER): Admitting: Internal Medicine

## 2023-07-30 ENCOUNTER — Telehealth: Payer: Self-pay | Admitting: Internal Medicine

## 2023-07-30 ENCOUNTER — Encounter: Payer: Self-pay | Admitting: Internal Medicine

## 2023-07-30 VITALS — BP 136/74 | HR 68 | Temp 97.8°F | Ht 71.0 in | Wt 171.6 lb

## 2023-07-30 DIAGNOSIS — S59902A Unspecified injury of left elbow, initial encounter: Secondary | ICD-10-CM

## 2023-07-30 NOTE — Progress Notes (Signed)
 Subjective:    Patient ID: Kevin [name], male    DOB: 01-01-1946, 78 y.o.   MRN: [id number]  DOS:  07/30/2023 Type of visit - description: Acute  Days ago, a door slam on his left arm, slightly proximal from the elbow. He had 2 small openings, bled for a short period of time. He is here because his wife thinks the area may be infected. He denies any tenderness to palpation. No fever or chills. Has not seen any discharge from there.   Review of Systems See above   Past Medical History:  Diagnosis Date   COPD (chronic obstructive pulmonary disease) (HCC)    PFTs 01-2006 mild obstruction, (+) reponse to Bronchodilators     GERD (gastroesophageal reflux disease)    with certain foods   Hyperlipidemia    on meds   Hypertension    on meds   Seasonal allergies    Shingles    Thorax, 2008   Thyroid disease    on meds    Past Surgical History:  Procedure Laterality Date   COLONOSCOPY  11/16/2008   Jarold Motto   COLONOSCOPY  01/2019   HD-MAC-good prep-tics/TA x 8- 42yr recall   DENTAL SURGERY  2015   upper dentures   POLYPECTOMY  2020   TA x 8    Current Outpatient Medications  Medication Instructions   acetaminophen (TYLENOL) 500 mg, Every 6 hours PRN   aspirin EC 81 mg, Daily   calcium carbonate (OS-CAL) 600 mg, Daily at bedtime   Cyanocobalamin (VITAMIN B 12 PO) 1,000 mcg, 3 times weekly   Diclofenac Sodium CR 100 mg, Oral, Daily   Krill Oil 500 MG CAPS 1 capsule, Daily   levothyroxine (SYNTHROID) 88 mcg, Oral, Daily before breakfast   metoprolol succinate (TOPROL-XL) 50 mg, Oral, Daily   Multiple Vitamins-Minerals (MULTIVITAMIN PO) 1 tablet, Daily   ondansetron (ZOFRAN-ODT) 8 MG disintegrating tablet 8mg  ODT q8 hours prn nausea   simvastatin (ZOCOR) 40 mg, Oral, Daily-1800   tamsulosin (FLOMAX) 0.4 mg, Oral, Daily   Turmeric 500 mg, Daily   Zinc 50 mg, Daily       Objective:   Physical Exam Musculoskeletal:       Arms:     Comments: Mild swelling at the  left elbow, see graphic.  The swelling is not red, fluctuant or warm.  No openings, no discharge.   BP 136/74 (BP Location: Left Arm, Patient Position: Sitting, Cuff Size: Normal)   Pulse 68   Temp 97.8 F (36.6 C) (Oral)   Ht 5\' 11"  (1.803 m)   Wt 171 lb 9.6 oz (77.8 kg)   SpO2 99%   BMI 23.93 kg/m  General:   Well developed, NAD, BMI noted. HEENT:  Normocephalic . Face symmetric, atraumatic MSK: ROM elbows normal. Skin: Not pale. Not jaundice Neurologic:  alert & oriented X3.  Speech normal, gait appropriate for age and unassisted Psych--  Cognition and judgment appear intact.  Cooperative with normal attention span and concentration.  Behavior appropriate. No anxious or depressed appearing.      Assessment     Assessment Hyperglycemia HTN Hypothyroidism Hyperlipidemia LUTS, urolithiasis 1st episode 02-2023  COPD: PFTs 2007 mild obstruction, + response to bronchodilators, h/o multiple inhalers intolerance  Shingles 2008, chest. CAD per CT 2020 (continue aspirin) ETOH in the past  Skin Cancer    PLAN Injury, left elbow: Injury to the left elbow 8 days ago, the patient's wife is concerned about infection, I do not see  evidence of infection/cellulitis/abscess. Recommend conservative treatment with a heating pad, Tylenol, if he is not getting better gradually he should let me know, see AVS

## 2023-07-30 NOTE — Telephone Encounter (Signed)
 Pharmacy comment: Alternative Requested:UNABLE TO GET THIS GENERIC. IS IT OK TO SWITCH TO GENERIC WE HAVE IN STOCK? THANK YOU!

## 2023-07-30 NOTE — Patient Instructions (Signed)
 Heating pad twice a day  Tylenol  500 mg OTC 2 tabs a day every 8 hours as needed for pain   Call if fever, chills, more swelling, discharge, the area gets warm or red

## 2023-07-30 NOTE — Assessment & Plan Note (Signed)
 Injury, left elbow: Injury to the left elbow 8 days ago, the patient's wife is concerned about infection, I do not see evidence of infection/cellulitis/abscess. Recommend conservative treatment with a heating pad, Tylenol, if he is not getting better gradually he should let me know, see AVS

## 2023-08-07 DIAGNOSIS — H40013 Open angle with borderline findings, low risk, bilateral: Secondary | ICD-10-CM | POA: Diagnosis not present

## 2023-09-07 ENCOUNTER — Other Ambulatory Visit: Payer: Self-pay | Admitting: Internal Medicine

## 2023-09-15 DIAGNOSIS — H1045 Other chronic allergic conjunctivitis: Secondary | ICD-10-CM | POA: Diagnosis not present

## 2023-09-15 DIAGNOSIS — H0102B Squamous blepharitis left eye, upper and lower eyelids: Secondary | ICD-10-CM | POA: Diagnosis not present

## 2023-09-15 DIAGNOSIS — H2513 Age-related nuclear cataract, bilateral: Secondary | ICD-10-CM | POA: Diagnosis not present

## 2023-09-15 DIAGNOSIS — H40013 Open angle with borderline findings, low risk, bilateral: Secondary | ICD-10-CM | POA: Diagnosis not present

## 2023-10-20 ENCOUNTER — Encounter: Payer: Self-pay | Admitting: Internal Medicine

## 2023-10-21 ENCOUNTER — Ambulatory Visit: Payer: PPO | Admitting: Internal Medicine

## 2023-11-04 ENCOUNTER — Ambulatory Visit (INDEPENDENT_AMBULATORY_CARE_PROVIDER_SITE_OTHER): Admitting: Internal Medicine

## 2023-11-04 ENCOUNTER — Telehealth: Payer: Self-pay

## 2023-11-04 ENCOUNTER — Encounter: Payer: Self-pay | Admitting: Internal Medicine

## 2023-11-04 VITALS — BP 144/82 | HR 66 | Temp 98.1°F | Resp 16 | Ht 71.0 in | Wt 172.1 lb

## 2023-11-04 DIAGNOSIS — I10 Essential (primary) hypertension: Secondary | ICD-10-CM

## 2023-11-04 DIAGNOSIS — R1033 Periumbilical pain: Secondary | ICD-10-CM

## 2023-11-04 DIAGNOSIS — E039 Hypothyroidism, unspecified: Secondary | ICD-10-CM

## 2023-11-04 DIAGNOSIS — D044 Carcinoma in situ of skin of scalp and neck: Secondary | ICD-10-CM | POA: Insufficient documentation

## 2023-11-04 LAB — CBC WITH DIFFERENTIAL/PLATELET
Basophils Absolute: 0 10*3/uL (ref 0.0–0.1)
Basophils Relative: 0.3 % (ref 0.0–3.0)
Eosinophils Absolute: 0 10*3/uL (ref 0.0–0.7)
Eosinophils Relative: 0.5 % (ref 0.0–5.0)
HCT: 42.1 % (ref 39.0–52.0)
Hemoglobin: 14 g/dL (ref 13.0–17.0)
Lymphocytes Relative: 23.5 % (ref 12.0–46.0)
Lymphs Abs: 1.4 10*3/uL (ref 0.7–4.0)
MCHC: 33.3 g/dL (ref 30.0–36.0)
MCV: 92.1 fl (ref 78.0–100.0)
Monocytes Absolute: 0.7 10*3/uL (ref 0.1–1.0)
Monocytes Relative: 12.4 % — ABNORMAL HIGH (ref 3.0–12.0)
Neutro Abs: 3.8 10*3/uL (ref 1.4–7.7)
Neutrophils Relative %: 63.3 % (ref 43.0–77.0)
Platelets: 232 10*3/uL (ref 150.0–400.0)
RBC: 4.57 Mil/uL (ref 4.22–5.81)
RDW: 13.7 % (ref 11.5–15.5)
WBC: 6 10*3/uL (ref 4.0–10.5)

## 2023-11-04 LAB — URINALYSIS, ROUTINE W REFLEX MICROSCOPIC
Bilirubin Urine: NEGATIVE
Ketones, ur: NEGATIVE
Leukocytes,Ua: NEGATIVE
Nitrite: NEGATIVE
Specific Gravity, Urine: 1.02 (ref 1.000–1.030)
Urine Glucose: NEGATIVE
Urobilinogen, UA: 0.2 (ref 0.0–1.0)
pH: 6 (ref 5.0–8.0)

## 2023-11-04 LAB — COMPREHENSIVE METABOLIC PANEL WITH GFR
ALT: 13 U/L (ref 0–53)
AST: 18 U/L (ref 0–37)
Albumin: 4.3 g/dL (ref 3.5–5.2)
Alkaline Phosphatase: 58 U/L (ref 39–117)
BUN: 17 mg/dL (ref 6–23)
CO2: 33 meq/L — ABNORMAL HIGH (ref 19–32)
Calcium: 9.5 mg/dL (ref 8.4–10.5)
Chloride: 100 meq/L (ref 96–112)
Creatinine, Ser: 1.53 mg/dL — ABNORMAL HIGH (ref 0.40–1.50)
GFR: 42.81 mL/min — ABNORMAL LOW (ref >60.00)
Glucose, Bld: 101 mg/dL — ABNORMAL HIGH (ref 70–99)
Potassium: 4.9 meq/L (ref 3.5–5.1)
Sodium: 138 meq/L (ref 135–145)
Total Bilirubin: 0.5 mg/dL (ref 0.2–1.2)
Total Protein: 7.1 g/dL (ref 6.0–8.3)

## 2023-11-04 LAB — TSH: TSH: 1.22 u[IU]/mL (ref 0.35–5.50)

## 2023-11-04 MED ORDER — DICYCLOMINE HCL 10 MG PO CAPS: 10.0000 mg | ORAL_CAPSULE | Freq: Three times a day (TID) | ORAL | 0 refills | Status: DC

## 2023-11-04 NOTE — Telephone Encounter (Signed)
 Called Pt's wife Jacinto, informed of request for abdominal x-ray, she informed that downstairs has a facility fee and Pt declined to do it. Informed I'd change order to [facility] on [address]. Gemma verbalized understanding.

## 2023-11-04 NOTE — Telephone Encounter (Signed)
 LMOM informing he didn't stop by imaging on his way out of the building today, PCP is wanting an x-ray of his abdomen. I asked that he come back by at his earliest convenience.

## 2023-11-04 NOTE — Assessment & Plan Note (Signed)
 Mid abdominal pain: Described as cramps, exam confirms the area of tenderness but no mass or rebound. Early to say what  this pain will turn out to be. Plan: CMP, CBC, UA abdominal x-ray (early SBO?).  Call or ER if severe symptoms, ongoing constipation, diarrhea. HTN: BP slightly higher than usual, possibly related to pain.  Continue metoprolol , monitor BPs. Hypothyroidism: Check TSH RTC 3 months

## 2023-11-04 NOTE — Addendum Note (Signed)
 Addended by: Laelani Vasko D on: 11/04/2023 04:47 PM   Modules accepted: Orders

## 2023-11-04 NOTE — Progress Notes (Signed)
 Subjective:    Patient ID: ADELBERT GASPARD, male    DOB: May 26, 1945, 78 y.o.   MRN: [id number]  DOS:  11/04/2023 Type of visit - description: Acute  Symptoms started yesterday: Mid abdominal cramps, feeling bloated, appetite is slightly decreased.  Denies burning type of abdominal pain. Last BM was 48 hours ago, no blood in the stools. Has mild nausea but has no vomiting. Denies any dysuria or gross hematuria. No recent antibiotics Does take NSAIDs regularly. No sick contacts. No fever or chills.     BP Readings from Last 3 Encounters:  11/04/23 (!) 144/82  07/30/23 136/74  04/22/23 136/82   Wt Readings from Last 3 Encounters:  11/04/23 172 lb 2 oz (78.1 kg)  07/30/23 171 lb 9.6 oz (77.8 kg)  04/22/23 167 lb 4 oz (75.9 kg)      Review of Systems See above   Past Medical History:  Diagnosis Date   COPD (chronic obstructive pulmonary disease) (HCC)    PFTs 01-2006 mild obstruction, (+) reponse to Bronchodilators     GERD (gastroesophageal reflux disease)    with certain foods   Hyperlipidemia    on meds   Hypertension    on meds   Seasonal allergies    Shingles    Thorax, 2008   Thyroid  disease    on meds    Past Surgical History:  Procedure Laterality Date   COLONOSCOPY  11/16/2008   Jakie   COLONOSCOPY  01/2019   HD-MAC-good prep-tics/TA x 8- 39yr recall   DENTAL SURGERY  2015   upper dentures   POLYPECTOMY  2020   TA x 8    Current Outpatient Medications  Medication Instructions   acetaminophen (TYLENOL) 500 mg, Every 6 hours PRN   aspirin EC 81 mg, Daily   calcium carbonate (OS-CAL) 600 mg, Daily at bedtime   Cyanocobalamin  (VITAMIN B 12 PO) 1,000 mcg, 3 times weekly   dicyclomine (BENTYL) 10 mg, Oral, 3 times daily before meals & bedtime   Krill Oil 500 MG CAPS 1 capsule, Daily   levothyroxine  (SYNTHROID ) 88 mcg, Oral, Daily before breakfast   metoprolol  succinate (TOPROL -XL) 50 mg, Oral, Daily   Multiple Vitamins-Minerals (MULTIVITAMIN  PO) 1 tablet, Daily   ondansetron  (ZOFRAN -ODT) 8 MG disintegrating tablet 8mg  ODT q8 hours prn nausea   simvastatin  (ZOCOR ) 40 mg, Oral, Daily-1800   tamsulosin  (FLOMAX ) 0.4 mg, Oral, Daily   Turmeric 500 mg, Daily   Zinc 50 mg, Daily       Objective:   Physical Exam Abdominal:    BP (!) 144/82   Pulse 66   Temp 98.1 F (36.7 C) (Oral)   Resp 16   Ht 5' 11 (1.803 m)   Wt 172 lb 2 oz (78.1 kg)   SpO2 97%   BMI 24.01 kg/m  General:   Well developed, NAD, BMI noted.  HEENT:  Normocephalic . Face symmetric, atraumatic Lungs:  CTA B Normal respiratory effort, no intercostal retractions, no accessory muscle use. Heart: RRR,  no murmur.  Abdomen:  Not distended, soft, slightly tender without mass or rebound.  See graphic Skin: Not pale. Not jaundice Lower extremities: no pretibial edema bilaterally  Neurologic:  alert & oriented X3.  Speech normal, gait appropriate for age and unassisted Psych--  Cognition and judgment appear intact.  Cooperative with normal attention span and concentration.  Behavior appropriate. No anxious or depressed appearing.     Assessment    Assessment Hyperglycemia HTN Hypothyroidism Hyperlipidemia LUTS, urolithiasis  1st episode 02-2023  COPD: PFTs 2007 mild obstruction, + response to bronchodilators, h/o multiple inhalers intolerance  Shingles 2008, chest. CAD per CT 2020 (continue aspirin) ETOH in the past  Skin Cancer    PLAN Mid abdominal pain: Described as cramps, exam confirms the area of tenderness but no mass or rebound. Early to say what  this pain will turn out to be. Plan: CMP, CBC, UA abdominal x-ray (early SBO?).  Call or ER if severe symptoms, ongoing constipation, diarrhea. HTN: BP slightly higher than usual, possibly related to pain.  Continue metoprolol , monitor BPs. Hypothyroidism: Check TSH RTC 3 months

## 2023-11-04 NOTE — Patient Instructions (Addendum)
 If you have severe symptoms: Increased pain, fever chills, diarrhea, blood in the stools: Either call or go to the ER.  Call the office if not gradually better in few days  For abdominal cramps: You can try Bentyl 4 times a day as needed  You can take Tylenol as needed for pain but avoid any anti-inflammatories such as ibuprofen or naproxen   Your blood pressure is slightly elevated today, please check it at home at least 3 times a week. Blood pressure goal:  between 110/65 and  135/85. If it is consistently higher or lower, let me know   GO TO THE LAB :  Get the blood work   Your results will be posted on MyChart with my comments  Next office visit for a checkup in 3 months Please make an appointment before you leave today    STOP BY THE FIRST FLOOR:  get the XR

## 2023-11-05 ENCOUNTER — Telehealth: Payer: Self-pay | Admitting: Internal Medicine

## 2023-11-05 ENCOUNTER — Ambulatory Visit: Payer: Self-pay | Admitting: Internal Medicine

## 2023-11-05 ENCOUNTER — Ambulatory Visit
Admission: RE | Admit: 2023-11-05 | Discharge: 2023-11-05 | Disposition: A | Discharge status: Home / Self Care | Source: Ambulatory Visit | Attending: Internal Medicine | Admitting: Internal Medicine

## 2023-11-05 DIAGNOSIS — R1033 Periumbilical pain: Secondary | ICD-10-CM

## 2023-11-05 DIAGNOSIS — N200 Calculus of kidney: Secondary | ICD-10-CM | POA: Diagnosis not present

## 2023-11-05 DIAGNOSIS — K5900 Constipation, unspecified: Secondary | ICD-10-CM | POA: Diagnosis not present

## 2023-11-05 DIAGNOSIS — N289 Disorder of kidney and ureter, unspecified: Secondary | ICD-10-CM

## 2023-11-05 DIAGNOSIS — R319 Hematuria, unspecified: Secondary | ICD-10-CM

## 2023-11-05 LAB — URINE CULTURE
MICRO NUMBER:: 16618056
Result:: NO GROWTH
SPECIMEN QUALITY:: ADEQUATE

## 2023-11-05 NOTE — Telephone Encounter (Signed)
 Please call patient: Tests show blood in the urine and   decreased kidney function. Arrange for a CT renal stone study, DX hematuria (no contrast required) Urology referral BMP 1 week Also recommend MiraLAX 17 g daily for 3 to 4 days.  ==== Results reviewed: - Creatinine increased from 0.9 to 1.5. - CBC normal - Hematuria with negative urine culture. - Abdominal x-rays with R renal calculi, some constipation. -TSH okay.

## 2023-11-06 ENCOUNTER — Ambulatory Visit: Payer: Self-pay

## 2023-11-06 ENCOUNTER — Ambulatory Visit
Admission: RE | Admit: 2023-11-06 | Discharge: 2023-11-06 | Disposition: A | Discharge status: Home / Self Care | Source: Ambulatory Visit | Attending: Internal Medicine | Admitting: Internal Medicine

## 2023-11-06 DIAGNOSIS — N289 Disorder of kidney and ureter, unspecified: Secondary | ICD-10-CM

## 2023-11-06 DIAGNOSIS — I700 Atherosclerosis of aorta: Secondary | ICD-10-CM | POA: Diagnosis not present

## 2023-11-06 DIAGNOSIS — K5730 Diverticulosis of large intestine without perforation or abscess without bleeding: Secondary | ICD-10-CM | POA: Diagnosis not present

## 2023-11-06 DIAGNOSIS — N132 Hydronephrosis with renal and ureteral calculous obstruction: Secondary | ICD-10-CM | POA: Diagnosis not present

## 2023-11-06 NOTE — Telephone Encounter (Signed)
 Last read by Lynwood JONELLE Somerset at 9:39AM on 11/06/2023.

## 2023-11-06 NOTE — Telephone Encounter (Signed)
 Pt scheduled at 2:20 today for CT.

## 2023-11-06 NOTE — Telephone Encounter (Signed)
 LMOM asking for call back.

## 2023-11-07 DIAGNOSIS — N201 Calculus of ureter: Secondary | ICD-10-CM | POA: Diagnosis not present

## 2023-11-07 DIAGNOSIS — N132 Hydronephrosis with renal and ureteral calculous obstruction: Secondary | ICD-10-CM | POA: Diagnosis not present

## 2023-11-10 ENCOUNTER — Other Ambulatory Visit (INDEPENDENT_AMBULATORY_CARE_PROVIDER_SITE_OTHER)

## 2023-11-10 DIAGNOSIS — N289 Disorder of kidney and ureter, unspecified: Secondary | ICD-10-CM

## 2023-11-10 LAB — BASIC METABOLIC PANEL WITH GFR
BUN: 18 mg/dL (ref 6–23)
CO2: 27 meq/L (ref 19–32)
Calcium: 8.8 mg/dL (ref 8.4–10.5)
Chloride: 103 meq/L (ref 96–112)
Creatinine, Ser: 1.33 mg/dL (ref 0.40–1.50)
GFR: 50.64 mL/min — ABNORMAL LOW (ref >60.00)
Glucose, Bld: 94 mg/dL (ref 70–99)
Potassium: 5 meq/L (ref 3.5–5.1)
Sodium: 139 meq/L (ref 135–145)

## 2023-11-19 ENCOUNTER — Other Ambulatory Visit (INDEPENDENT_AMBULATORY_CARE_PROVIDER_SITE_OTHER): Payer: Self-pay

## 2023-11-19 ENCOUNTER — Encounter: Payer: Self-pay | Admitting: Physician Assistant

## 2023-11-19 ENCOUNTER — Ambulatory Visit: Admitting: Physician Assistant

## 2023-11-19 DIAGNOSIS — M79642 Pain in left hand: Secondary | ICD-10-CM | POA: Diagnosis not present

## 2023-11-19 NOTE — Progress Notes (Signed)
 HPI: Mr. Gomillion comes in today with left hand swelling no pain for the last month or so over the base of his thumb.  He has had no injury.  No weakness.  No numbness tingling.  He had no fevers chills.  Review of systems: See HPI otherwise negative.  Physical exam: General: Well-developed well-nourished male no acute distress mood affect appropriate Respirations: Unlabored Vascular: Radial pulses are 2+ equal symmetric.  Hands are well-perfused. Bilateral hands.  No rashes skin lesions ulcerations impending ulcers.  Sensation grossly intact light touch bilateral hands full motor bilateral hands.  Negative grind test bilaterally.  Significant crepitus with grind test of the left thumb.  Slight soft tissue swelling base of the left thumb no erythema.  There is no triggering of the fingers throughout both hands.  Radiographs: 3 views: Left hand: No acute fractures.  Significant CMC joint arthritis with osteophytes and loose bodies present near the [facility] joint.  Cystic changes noted in the first proximal phalanx.  Otherwise erosive changes or other bony lesions.  Impression: Left CMC joint arthritis  Plan: Conservative treatment given the fact that he has no pain recommended Voltaren  gel 2 g up to 4 times daily.  If he develops severe pain we could try to do [facility] joint injection.  If his condition changes in any way he will let us  know.  Questions were encouraged and answered at length.

## 2023-11-25 ENCOUNTER — Ambulatory Visit (INDEPENDENT_AMBULATORY_CARE_PROVIDER_SITE_OTHER)

## 2023-11-25 VITALS — Ht 71.0 in | Wt 172.0 lb

## 2023-11-25 DIAGNOSIS — Z0000 Encounter for general adult medical examination without abnormal findings: Secondary | ICD-10-CM

## 2023-11-25 NOTE — Patient Instructions (Signed)
 Mr. Grave , Thank you for taking time out of your busy schedule to complete your Annual Wellness Visit with me. I enjoyed our conversation and look forward to speaking with you again next year. I, as well as your care team,  appreciate your ongoing commitment to your health goals. Please review the following plan we discussed and let me know if I can assist you in the future. Your Game plan/ To Do List     Follow up Visits: Next Medicare AWV with our clinical staff: In 1 year    Have you seen your provider in the last 6 months (3 months if uncontrolled diabetes)? Yes Next Office Visit with your provider: 02/06/24 @ 10:20  Clinician Recommendations:  Aim for 30 minutes of exercise or brisk walking, 6-8 glasses of water, and 5 servings of fruits and vegetables each day.       This is a list of the screening recommended for you and due dates:  Health Maintenance  Topic Date Due   Flu Shot  12/12/2023   Medicare Annual Wellness Visit  11/24/2024   DTaP/Tdap/Td vaccine (4 - Td or Tdap) 05/12/2029   Pneumococcal Vaccine for age over 55  Completed   Zoster (Shingles) Vaccine  Completed   Hepatitis B Vaccine  Aged Out   HPV Vaccine  Aged Out   Meningitis B Vaccine  Aged Out   Colon Cancer Screening  Discontinued   COVID-19 Vaccine  Discontinued   Hepatitis C Screening  Discontinued    Advanced directives: (In Chart) A copy of your advanced directives are scanned into your chart should your provider ever need it.  Advance Care Planning is important because it:  [x]  Makes sure you receive the medical care that is consistent with your values, goals, and preferences  [x]  It provides guidance to your family and loved ones and reduces their decisional burden about whether or not they are making the right decisions based on your wishes.  Follow the link provided in your after visit summary or read over the paperwork we have mailed to you to help you started getting your Advance Directives in  place. If you need assistance in completing these, please reach out to us  so that we can help you!  See attachments for Preventive Care and Fall Prevention Tips.

## 2023-11-25 NOTE — Progress Notes (Signed)
 Subjective:   Kevin [name] is a 78 y.o. who presents for a Medicare Wellness preventive visit.  As a reminder, Annual Wellness Visits don't include a physical exam, and some assessments may be limited, especially if this visit is performed virtually. We may recommend an in-person follow-up visit with your provider if needed.  Visit Complete: Virtual I connected with  Kevin [name] on 11/25/23 by a video and audio enabled telemedicine application and verified that I am speaking with the correct person using two identifiers.  Patient Location: Home  Provider Location: Home Office  I discussed the limitations of evaluation and management by telemedicine. The patient expressed understanding and agreed to proceed.  Vital Signs: Because this visit was a virtual/telehealth visit, some criteria may be missing or patient reported. Any vitals not documented were not able to be obtained and vitals that have been documented are patient reported.  Persons Participating in Visit: Patient.  AWV Questionnaire: No: Patient Medicare AWV questionnaire was not completed prior to this visit.  Cardiac Risk Factors include: advanced age (>69men, >21 women);male gender;dyslipidemia;hypertension     Objective:    Today's Vitals   11/25/23 1348  Weight: 172 lb (78 kg)  Height: 5' 11 (1.803 m)   Body mass index is 23.99 kg/m.     11/25/2023    1:58 PM 03/02/2023   12:03 AM 02/05/2023   11:06 AM 01/29/2022   11:02 AM 01/27/2021   12:08 PM 12/18/2018    9:45 AM 12/12/2017    8:14 AM  Advanced Directives  Does Patient Have a Medical Advance Directive? Yes Yes Yes No No No No   Type of Estate agent of [place];Living will Healthcare Power of [employer] of [place];Living will      Does patient want to make changes to medical advance directive? No - Patient declined  No - Patient declined      Copy of Healthcare Power of Attorney in Chart? Yes - validated most recent  copy scanned in chart (See row information)  Yes - validated most recent copy scanned in chart (See row information)      Would patient like information on creating a medical advance directive?    No - Patient declined No - Patient declined No - Patient declined  No - Patient declined      Data saved with a previous flowsheet row definition    Current Medications (verified) Outpatient Encounter Medications as of 11/25/2023  Medication Sig   acetaminophen (TYLENOL) 500 MG tablet Take 500 mg by mouth every 6 (six) hours as needed.   aspirin EC 81 MG tablet Take 81 mg by mouth daily. Swallow whole.   calcium carbonate (OS-CAL) 600 MG TABS tablet Take 600 mg by mouth at bedtime.    Cyanocobalamin  (VITAMIN B 12 PO) Take 1,000 mcg by mouth 3 (three) times a week.   dicyclomine  (BENTYL ) 10 MG capsule Take 1 capsule (10 mg total) by mouth 4 (four) times daily -  before meals and at bedtime.   Krill Oil 500 MG CAPS Take 1 capsule by mouth daily.   levothyroxine  (SYNTHROID ) 88 MCG tablet Take 1 tablet (88 mcg total) by mouth daily before breakfast.   metoprolol  succinate (TOPROL -XL) 50 MG 24 hr tablet Take 1 tablet (50 mg total) by mouth daily.   Multiple Vitamins-Minerals (MULTIVITAMIN PO) Take 1 tablet by mouth daily. In the evening   ondansetron  (ZOFRAN -ODT) 8 MG disintegrating tablet 8mg  ODT q8 hours prn nausea  simvastatin  (ZOCOR ) 40 MG tablet Take 1 tablet (40 mg total) by mouth daily at 6 PM.   tamsulosin  (FLOMAX ) 0.4 MG CAPS capsule Take 1 capsule (0.4 mg total) by mouth daily.   Turmeric 500 MG CAPS Take 500 mg by mouth daily.   Zinc 50 MG TABS Take 50 mg by mouth daily.   No facility-administered encounter medications on file as of 11/25/2023.    Allergies (verified) Tiotropium bromide  monohydrate and Bromide ion [bromine]   History: Past Medical History:  Diagnosis Date   COPD (chronic obstructive pulmonary disease) (HCC)    PFTs 01-2006 mild obstruction, (+) reponse to  Bronchodilators     GERD (gastroesophageal reflux disease)    with certain foods   Hyperlipidemia    on meds   Hypertension    on meds   Seasonal allergies    Shingles    Thorax, 2008   Thyroid  disease    on meds   Past Surgical History:  Procedure Laterality Date   COLONOSCOPY  11/16/2008   Jakie   COLONOSCOPY  01/2019   HD-MAC-good prep-tics/TA x 8- 38yr recall   DENTAL SURGERY  2015   upper dentures   POLYPECTOMY  2020   TA x 8   Family History  Problem Relation Age of Onset   Hypertension Mother        M and S   CAD Father        F MI ~ 32 y/o   Colon cancer Neg Hx    Prostate cancer Neg Hx    Diabetes Neg Hx    Rectal cancer Neg Hx    Stomach cancer Neg Hx    Colon polyps Neg Hx    Esophageal cancer Neg Hx    Social History   Socioeconomic History   Marital status: Married    Spouse name: Not on file   Number of children: 3   Years of education: Not on file   Highest education level: Not on file  Occupational History   Occupation: retired 01-2013    Employer: [employer]  Tobacco Use   Smoking status: Former    Current packs/day: 0.00    Types: Cigarettes    Start date: 1960    Quit date: 05/13/2004    Years since quitting: 19.5   Smokeless tobacco: Never   Tobacco comments:    used to smoke 2 ppd   Vaping Use   Vaping status: Never Used  Substance and Sexual Activity   Alcohol use: Not Currently    Comment: occasional beer, 1-2 most day   Drug use: Yes    Types: Nitrous oxide   Sexual activity: Not Currently  Other Topics Concern   Not on file  Social History Narrative   Lives w/ wife   3 children   Daughter lives in [place], son in [place]   Social Drivers of Health   Financial Resource Strain: Low Risk  (11/25/2023)   Overall Financial Resource Strain (CARDIA)    Difficulty of Paying Living Expenses: Not hard at all  Food Insecurity: No Food Insecurity (11/25/2023)   Hunger Vital Sign    Worried About Running Out of Food in the Last Year: Never  true    Ran Out of Food in the Last Year: Never true  Transportation Needs: No Transportation Needs (11/25/2023)   PRAPARE - Administrator, Civil Service (Medical): No    Lack of Transportation (Non-Medical): No  Physical Activity: Inactive (11/25/2023)   Exercise  Vital Sign    Days of Exercise per Week: 0 days    Minutes of Exercise per Session: 0 min  Stress: No Stress Concern Present (11/25/2023)   [employer] of Occupational Health - Occupational Stress Questionnaire    Feeling of Stress: Not at all  Social Connections: Moderately Isolated (11/25/2023)   Social Connection and Isolation Panel    Frequency of Communication with Friends and Family: Once a week    Frequency of Social Gatherings with Friends and Family: Once a week    Attends Religious Services: More than 4 times per year    Active Member of [employer] or Organizations: No    Attends Banker Meetings: Never    Marital Status: Married    Tobacco Counseling Counseling given: Not Answered Tobacco comments: used to smoke 2 ppd     Clinical Intake:  Pre-visit preparation completed: Yes  Pain : No/denies pain  Diabetes: No  Lab Results  Component Value Date   HGBA1C 5.9 04/22/2023   HGBA1C 5.8 03/02/2021   HGBA1C 5.6 03/01/2020     How often do you need to have someone help you when you read instructions, pamphlets, or other written materials from your doctor or pharmacy?: 1 - Never  Interpreter Needed?: No  Information entered by :: Charmaine Bloodgood LPN   Activities of Daily Living     11/25/2023    1:52 PM 11/04/2023   10:27 AM  In your present state of health, do you have any difficulty performing the following activities:  Hearing? 0 0  Vision? 0 0  Difficulty concentrating or making decisions? 0 0  Walking or climbing stairs? 0 0  Dressing or bathing? 0 0  Doing errands, shopping? 0 0  Preparing Food and eating ? N   Using the Toilet? N   In the past six months, have  you accidently leaked urine? N   Do you have problems with loss of bowel control? N   Managing your Medications? N   Managing your Finances? N   Housekeeping or managing your Housekeeping? N     Patient Care Team: Amon Aloysius BRAVO, MD as PCP - General Danis, Victory LITTIE MOULD, MD as Consulting Physician (Gastroenterology) [place], Tawni LITTIE, MD as Referring Physician (Dermatology)  I have updated your Care Teams any recent Medical Services you may have received from other providers in the past year.     Assessment:   This is a routine wellness examination for Kevin [name].  Hearing/Vision screen Hearing Screening - Comments:: Denies hearing difficulties   Vision Screening - Comments:: Wears rx glasses - up to date with routine eye exams with Dr. Abigail    Goals Addressed               This Visit's Progress     Maintain current lifestyle (pt-stated)   On track      Depression Screen     11/25/2023    1:53 PM 11/04/2023   10:27 AM 04/22/2023    8:53 AM 02/05/2023   11:09 AM 01/31/2023    2:07 PM 10/21/2022    9:50 AM 07/02/2022    1:06 PM  PHQ 2/9 Scores  PHQ - 2 Score 0 0 0 0 0 0 0    Fall Risk     11/25/2023    1:59 PM 11/04/2023   10:27 AM 04/22/2023    8:53 AM 02/05/2023   11:05 AM 01/31/2023    2:07 PM  Fall Risk   Falls  in the past year? 0 0 1 1 0  Number falls in past yr: 0 0 0 0 0  Injury with Fall? 0 0 0 1 0  Risk for fall due to : No Fall Risks   History of fall(s)   Follow up Falls prevention discussed;Education provided;Falls evaluation completed Falls evaluation completed;Education provided Falls evaluation completed;Education provided Falls evaluation completed Falls evaluation completed    MEDICARE RISK AT HOME:  Medicare Risk at Home Any stairs in or around the home?: No If so, are there any without handrails?: No Home free of loose throw rugs in walkways, pet beds, electrical cords, etc?: Yes Adequate lighting in your home to reduce risk of falls?: Yes Life  alert?: No Use of a cane, walker or w/c?: No Grab bars in the bathroom?: Yes Shower chair or bench in shower?: No Elevated toilet seat or a handicapped toilet?: Yes  TIMED UP AND GO:  Was the test performed?  No  Cognitive Function: Declined/Normal: No cognitive concerns noted by patient or family. Patient alert, oriented, able to answer questions appropriately and recall recent events. No signs of memory loss or confusion.    12/10/2016    8:17 AM  MMSE - Mini Mental State Exam  Orientation to time 5   Orientation to Place 5   Registration 3   Attention/ Calculation 4   Recall 2   Language- name 2 objects 2   Language- repeat 1  Language- follow 3 step command 3   Language- read & follow direction 1   Write a sentence 1   Copy design 1   Total score 28      Data saved with a previous flowsheet row definition        02/05/2023   11:11 AM 01/29/2022   11:07 AM  6CIT Screen  What Year? 0 points 0 points  What month? 0 points 0 points  What time? 0 points 0 points  Count back from 20 0 points 0 points  Months in reverse 0 points 2 points  Repeat phrase 0 points 0 points  Total Score 0 points 2 points    Immunizations Immunization History  Administered Date(s) Administered   Fluad Quad(high Dose 65+) 01/15/2019   Influenza Split 05/22/2012   Influenza, High Dose Seasonal PF 02/10/2013, 03/11/2016, 01/29/2020, 01/22/2021   Influenza,inj,Quad PF,6+ Mos 02/21/2014   Influenza-Unspecified 02/11/2017, 02/10/2018, 02/10/2022, 12/13/2022   PFIZER Comirnaty(Gray Top)Covid-19 Tri-Sucrose Vaccine 09/12/2020   PFIZER(Purple Top)SARS-COV-2 Vaccination 06/03/2019, 06/24/2019, 04/11/2020   Pneumococcal Conjugate-13 03/23/2014   Pneumococcal Polysaccharide-23 04/15/2007, 04/20/2013   Td 05/13/1998, 08/01/2008   Tdap 05/13/2019   Zoster Recombinant(Shingrix ) 01/15/2019, 04/16/2019   Zoster, Live 09/14/2013    Screening Tests Health Maintenance  Topic Date Due   INFLUENZA  VACCINE  12/12/2023   Medicare Annual Wellness (AWV)  11/24/2024   DTaP/Tdap/Td (4 - Td or Tdap) 05/12/2029   Pneumococcal Vaccine: 50+ Years  Completed   Zoster Vaccines- Shingrix   Completed   Hepatitis B Vaccines  Aged Out   HPV VACCINES  Aged Out   Meningococcal B Vaccine  Aged Out   Colonoscopy  Discontinued   COVID-19 Vaccine  Discontinued   Hepatitis C Screening  Discontinued    Health Maintenance  There are no preventive care reminders to display for this patient.   Additional Screening:  Vision Screening: Recommended annual ophthalmology exams for early detection of glaucoma and other disorders of the eye. Would you like a referral to an eye doctor? No  Dental Screening: Recommended annual dental exams for proper oral hygiene  Community Resource Referral / Chronic Care Management: CRR required this visit?  No   CCM required this visit?  No   Plan:    I have personally reviewed and noted the following in the patient's chart:   Medical and social history Use of alcohol, tobacco or illicit drugs  Current medications and supplements including opioid prescriptions. Patient is not currently taking opioid prescriptions. Functional ability and status Nutritional status Physical activity Advanced directives List of other physicians Hospitalizations, surgeries, and ER visits in previous 12 months Vitals Screenings to include cognitive, depression, and falls Referrals and appointments  In addition, I have reviewed and discussed with patient certain preventive protocols, quality metrics, and best practice recommendations. A written personalized care plan for preventive services as well as general preventive health recommendations were provided to patient.   Lavelle Pfeiffer [place], [place]   [id number]/84/7974   After Visit Summary: (MyChart) Due to this being a telephonic visit, the after visit summary with patients personalized plan was offered to patient via MyChart    Notes: Nothing significant to report at this time.

## 2023-11-26 ENCOUNTER — Other Ambulatory Visit: Payer: Self-pay | Admitting: Internal Medicine

## 2023-12-04 DIAGNOSIS — N201 Calculus of ureter: Secondary | ICD-10-CM | POA: Diagnosis not present

## 2023-12-12 ENCOUNTER — Other Ambulatory Visit (INDEPENDENT_AMBULATORY_CARE_PROVIDER_SITE_OTHER)

## 2023-12-12 ENCOUNTER — Ambulatory Visit: Payer: Self-pay | Admitting: Family

## 2023-12-12 DIAGNOSIS — N289 Disorder of kidney and ureter, unspecified: Secondary | ICD-10-CM | POA: Diagnosis not present

## 2023-12-12 LAB — BASIC METABOLIC PANEL WITH GFR
BUN: 15 mg/dL (ref 6–23)
CO2: 30 meq/L (ref 19–32)
Calcium: 8.7 mg/dL (ref 8.4–10.5)
Chloride: 104 meq/L (ref 96–112)
Creatinine, Ser: 1.06 mg/dL (ref 0.40–1.50)
GFR: 66.45 mL/min (ref >60.00)
Glucose, Bld: 95 mg/dL (ref 70–99)
Potassium: 4.4 meq/L (ref 3.5–5.1)
Sodium: 141 meq/L (ref 135–145)

## 2024-01-07 DIAGNOSIS — N2882 Megaloureter: Secondary | ICD-10-CM | POA: Diagnosis not present

## 2024-01-07 DIAGNOSIS — K5730 Diverticulosis of large intestine without perforation or abscess without bleeding: Secondary | ICD-10-CM | POA: Diagnosis not present

## 2024-01-07 DIAGNOSIS — N201 Calculus of ureter: Secondary | ICD-10-CM | POA: Diagnosis not present

## 2024-01-13 DIAGNOSIS — L570 Actinic keratosis: Secondary | ICD-10-CM | POA: Diagnosis not present

## 2024-01-13 DIAGNOSIS — L821 Other seborrheic keratosis: Secondary | ICD-10-CM | POA: Diagnosis not present

## 2024-01-14 ENCOUNTER — Other Ambulatory Visit: Payer: Self-pay | Admitting: Urology

## 2024-01-15 ENCOUNTER — Encounter (HOSPITAL_COMMUNITY): Payer: Self-pay | Admitting: Urology

## 2024-01-15 NOTE — Progress Notes (Signed)
 Spoke w/ via phone for pre-op interview-Patient Lab needs dos-KUB         Lab results------ COVID test -Not indicated-patient states asymptomatic no test needed Arrive at --1030 NPO after MN NO Solid Food.  Clear liquids from MN until-0600 Pre-Surgery Ensure or G2:  Med rec completed Medications to take morning of surgery --Toprol , Synthroid , Zocor  and Tylenol if needed Diabetic medication --none  GLP1 agonist last dose: GLP1 instructions:  Patient instructed no nail polish to be worn day of surgery Patient instructed to bring photo id and insurance card day of surgery Patient aware to have Driver (ride ) / caregiver    for 24 hours after surgery - spouse- Gemma  Patient Special Instructions --Take laxative of choice on Sunday, hydrate well and eat light meal that evening, do not wear metal from waist down (including snaps, buckles, buttons, zippers), bring blue folder from [facility] Pre-Op special Instructions --per Norita Dustman / [place]  Patient verbalized understanding of instructions that were given at this phone interview. Patient denies chest pain, sob, fever, cough at the interview.

## 2024-01-19 ENCOUNTER — Ambulatory Visit (HOSPITAL_COMMUNITY)
Admission: RE | Admit: 2024-01-19 | Discharge: 2024-01-19 | Disposition: A | Discharge status: Home / Self Care | Attending: Urology | Admitting: Urology

## 2024-01-19 ENCOUNTER — Other Ambulatory Visit: Payer: Self-pay

## 2024-01-19 ENCOUNTER — Ambulatory Visit (HOSPITAL_COMMUNITY)

## 2024-01-19 ENCOUNTER — Encounter (HOSPITAL_COMMUNITY): Payer: Self-pay | Admitting: Urology

## 2024-01-19 ENCOUNTER — Ambulatory Visit: Payer: Self-pay | Admitting: Family

## 2024-01-19 ENCOUNTER — Encounter (HOSPITAL_COMMUNITY)
Admission: RE | Disposition: A | Discharge status: Home / Self Care | Payer: Self-pay | Source: Home / Self Care | Attending: Urology

## 2024-01-19 DIAGNOSIS — I10 Essential (primary) hypertension: Secondary | ICD-10-CM | POA: Diagnosis not present

## 2024-01-19 DIAGNOSIS — K219 Gastro-esophageal reflux disease without esophagitis: Secondary | ICD-10-CM | POA: Insufficient documentation

## 2024-01-19 DIAGNOSIS — Z7982 Long term (current) use of aspirin: Secondary | ICD-10-CM | POA: Diagnosis not present

## 2024-01-19 DIAGNOSIS — E785 Hyperlipidemia, unspecified: Secondary | ICD-10-CM | POA: Diagnosis not present

## 2024-01-19 DIAGNOSIS — N201 Calculus of ureter: Secondary | ICD-10-CM | POA: Insufficient documentation

## 2024-01-19 DIAGNOSIS — J449 Chronic obstructive pulmonary disease, unspecified: Secondary | ICD-10-CM | POA: Insufficient documentation

## 2024-01-19 HISTORY — PX: EXTRACORPOREAL SHOCK WAVE LITHOTRIPSY: SHX1557

## 2024-01-19 SURGERY — LITHOTRIPSY, ESWL
Anesthesia: LOCAL | Laterality: Right

## 2024-01-19 MED ORDER — HYDROCODONE-ACETAMINOPHEN 5-325 MG PO TABS: 1.0000 | ORAL_TABLET | Freq: Four times a day (QID) | ORAL | 0 refills | Status: DC | PRN

## 2024-01-19 MED ORDER — DIAZEPAM 5 MG PO TABS
10.0000 mg | ORAL_TABLET | ORAL | Status: AC
  Administered 2024-01-19: 5 mg via ORAL
  Filled 2024-01-19: qty 2

## 2024-01-19 MED ORDER — DIPHENHYDRAMINE HCL 25 MG PO CAPS
25.0000 mg | ORAL_CAPSULE | ORAL | Status: AC
  Administered 2024-01-19: 25 mg via ORAL
  Filled 2024-01-19: qty 1

## 2024-01-19 MED ORDER — TAMSULOSIN HCL 0.4 MG PO CAPS: 0.4000 mg | ORAL_CAPSULE | Freq: Every day | ORAL | 0 refills | Status: DC

## 2024-01-19 MED ORDER — SODIUM CHLORIDE 0.9 % IV SOLN: INTRAVENOUS | Status: DC

## 2024-01-19 MED ORDER — CIPROFLOXACIN HCL 500 MG PO TABS
500.0000 mg | ORAL_TABLET | ORAL | Status: AC
  Administered 2024-01-19: 500 mg via ORAL
  Filled 2024-01-19: qty 1

## 2024-01-19 NOTE — Op Note (Signed)
 See [facility] OP note scanned into chart. Also because of the size, density, location and other factors that cannot be anticipated I feel this will likely be a staged procedure. This fact supersedes any indication in the scanned [place] [facility] operative note to the contrary.

## 2024-01-19 NOTE — H&P (Signed)
 CC/HPI: 02/2023: Kevin [name] is a 78 year old male who is seen in consultation today for a distal left ureteral stone.   1. Left ureteral stone:  -He presented to ER in 03/02/2023 with acute onset left-sided flank pain. CT A/P 03/02/2023: 4 mm distal left ureteral stone with left hydronephrosis. Also present is a 6 mm right upper pole renal stone and punctate left lower pole renal stone. There is no sign of infection. He was discharged home with tamsulosin , pain control and Zofran . He states that pain resolved the following day. He thinks he may have passed a stone however is not sure as he never saw stone pass. He has no prior history of urolithiasis.   Patient currently denies fever, chills, sweats, nausea, vomiting, abdominal or flank pain, gross hematuria or dysuria.   He has a past medical history of COPD, GERD, hyperlipidemia, hypertension.   11/07/2023: Patient with above-noted history, he never followed up for repeat evaluation in regards to ongoing surveillance of the previously identified distal left ureteral calculi. The patient cannot recall if he ever passed the stone or not. Seen acutely today, referred by primary care. He presented a couple of days ago with some complaints of generalized abdominal pain. He had increased creatinine from baseline on serum lab work, 1.53. He also had notable microscopic hematuria on UA. Symptoms are not associated with any new or worsening unilateral lower back or flank pain/discomfort. Not associated with any new or worsening LUTS including absence of any dysuria or gross hematuria. CT imaging obtained which showed a right proximal ureteral calculi around 4 mm with associated mild upstream hydroureteronephrosis. There were no obvious stones or other obstructive signs on the left. Today he states he is feeling well. He denies any abdominal pain/discomfort. He has chronic bilateral lower back pain at baseline but he has not had any unilateral, specifically right  sided lower back or flank pain/discomfort. Again he denies any new or worsening LUTS including absence of any dysuria or gross hematuria. Denies fevers or chills, nausea/vomiting.   12/04/2023: Kevin [name] returns today for follow-up evaluation. I initiated medical expulsive therapy with him at last exam for previous identified right proximal ureteral calculi. He was given prescription for tamsulosin , urine strainer as well as pain medicine to use as needed. He has been mostly asymptomatic since I last saw him. He had some left lower quadrant abdominal pain a couple days ago but that was very fleeting and short-lived. He denies interval stone material passage. He said no recurrence of right sided pain/discomfort suggestive of obstructive uropathy. Denies any new or worsening LUTS including changes in force of stream, increased urgency, dysuria or gross hematuria. No verbal fevers or chills, nausea/vomiting.   01/07/2024: Patient returns today for follow-up evaluation/CT stone protocol study. At last exam KUB was inconclusive with possible transition of previous identified right proximal ureteral calculi transitioning into the right mid ureter.There was some concern for some mild hydronephrosis on ultrasound. Patient was asymptomatic at that time so he was continued on medical expulsive therapy with tamsulosin . He remains grossly asymptomatic through today's appointment. Has had no recurrent right-sided pain or discomfort suggestive of obstructive uropathy. Denies any new or worsening LUTS including changes in force of stream, increased frequency/urgency, dysuria or gross hematuria. He denies any interval stone material passage. No interval fevers or chills, nausea/vomiting. He does continue to endorse some occasional left lower quadrant abdominal pain/discomfort. CT imaging today shows a minimally tender nonobstructing right mid ureteral calculi, easily seen on scout image.  I drew an arrow. There was no ureteral  stone or obstructive signs identified on the left side.     ALLERGIES: No Allergies    MEDICATIONS: Levothyroxine  Sodium 88 MCG Tablet  Metoprolol  Succinate ER 50 MG Tablet Extended Release 24 Hour  Simvastatin  40 MG Tablet  Tamsulosin  HCl 0.4 MG Capsule 1 capsule PO Daily  Acetaminophen  Extra Strength 500 MG Tablet  Aspirin 81 MG Tablet Delayed Release  Calcium Carbonate  HYDROcodone -Acetaminophen  5-325 MG Tablet 1 tablet PO Q 6 H PRN Take for kidney stone pain  Krill Oil  Multivitamin  Tumeric 1 PO Daily  Zinc     GU PSH: No GU PSH     NON-GU PSH: Visit Complexity (formerly GPC1X) - 12/04/2023, 11/07/2023, 03/07/2023           GU PMH: Ureteral calculus - 12/04/2023, - 11/07/2023, - 03/07/2023 Ureteral obstruction secondary to calculous - 12/04/2023, - 11/07/2023     NON-GU PMH: No Non-GU PMH     FAMILY HISTORY: No Family History     SOCIAL HISTORY: No Social History     REVIEW OF SYSTEMS:     GU Review Male:  Patient denies frequent urination, hard to postpone urination, burning/ pain with urination, get up at night to urinate, leakage of urine, stream starts and stops, trouble starting your stream, have to strain to urinate , erection problems, and penile pain.    Gastrointestinal (Upper):  Patient denies nausea, vomiting, and indigestion/ heartburn.    Gastrointestinal (Lower):  Patient denies diarrhea and constipation.    Constitutional:  Patient denies fever, night sweats, weight loss, and fatigue.    Skin:  Patient denies skin rash/ lesion and itching.    Eyes:  Patient denies blurred vision and double vision.    Ears/ Nose/ Throat:  Patient denies sore throat and sinus problems.    Hematologic/Lymphatic:  Patient denies swollen glands and easy bruising.    Cardiovascular:  Patient denies leg swelling and chest pains.    Respiratory:  Patient denies cough and shortness of breath.    Endocrine:  Patient denies excessive thirst.    Musculoskeletal:  Patient reports back pain.  Patient denies joint pain.    Neurological:  Patient denies dizziness and headaches.    Psychologic:  Patient denies depression and anxiety.    Notes: Reviewed previous review of systems 11/07/2023. No changes.    VITAL SIGNS: None     MULTI-SYSTEM PHYSICAL EXAMINATION:      Constitutional: Well-nourished. No physical deformities. Normally developed. Good grooming.     Neck: Neck symmetrical, not swollen. Normal tracheal position.     Respiratory: No labored breathing, no use of accessory muscles.      Cardiovascular: Normal temperature, normal extremity pulses, no swelling, no varicosities.     Neurologic / Psychiatric: Oriented to time, oriented to place, oriented to person. No depression, no anxiety, no agitation.     Gastrointestinal: No mass, no tenderness, no rigidity, non obese abdomen. No CVA tenderness     Musculoskeletal: Normal gait and station of head and neck.            Complexity of Data:   Source Of History:  Patient, Medical Record Summary  Records Review:  Previous Doctor Records, Previous Hospital Records, Previous Patient Records  Urine Test Review:  Urinalysis, Urine Culture  X-Ray Review: KUB: Reviewed Films. Discussed With Patient.  C.T. Stone Protocol: Reviewed Films. Discussed With Patient.      01/07/24  Urinalysis  Urine Appearance  Clear   Urine Color Straw   Urine Glucose Neg mg/dL  Urine Bilirubin Neg mg/dL  Urine Ketones Neg mg/dL  Urine Specific Gravity 1.010   Urine Blood 3+ ery/uL  Urine pH <=5.0   Urine Protein Neg mg/dL  Urine Urobilinogen 0.2 mg/dL  Urine Nitrites Neg   Urine Leukocyte Esterase Neg leu/uL  Urine WBC/hpf NS (Not Seen)   Urine RBC/hpf 3 - 10/hpf   Urine Epithelial Cells NS (Not Seen)   Urine Bacteria Rare (0-9/hpf)   Urine Mucous Not Present   Urine Yeast NS (Not Seen)   Urine Trichomonas Not Present   Urine Cystals NS (Not Seen)   Urine Casts NS (Not Seen)   Urine Sperm Not Present    PROCEDURES:    C.T. Urogram -  [id number]      Patient confirmed No Neulasta OnPro Device.    Visit Complexity - G2211      Urinalysis w/Scope  Dipstick Dipstick Cont'd Micro  Color: Straw Bilirubin: Neg mg/dL WBC/hpf: NS (Not Seen)  Appearance: Clear Ketones: Neg mg/dL RBC/hpf: 3 - 89/yeq  Specific Gravity: 1.010 Blood: 3+ ery/uL Bacteria: Rare (0-9/hpf)  pH: <=5.0 Protein: Neg mg/dL Cystals: NS (Not Seen)  Glucose: Neg mg/dL Urobilinogen: 0.2 mg/dL Casts: NS (Not Seen)   Nitrites: Neg Trichomonas: Not Present   Leukocyte Esterase: Neg leu/uL Mucous: Not Present    Epithelial Cells: NS (Not Seen)    Yeast: NS (Not Seen)    Sperm: Not Present    ASSESSMENT:     ICD-10 Details  1 GU:  Ureteral calculus - N20.1 Right, Acute, Complicated Injury   PLAN:   Orders  Labs Urine Culture  Schedule  Return Visit/Planned Activity: Next Available Appointment - Follow up MD, Schedule Surgery  Document  Letter(s):  Created for Patient: Clinical Summary   Notes:  Imaging reviewed with Dr. Selma today. He continues with the persistent but minimally to nonobstructing right mid ureteral calculi easily seen on CT scout imaging. An arrow was drawn for easier identification. Final radiological interpretation pending. If any other pertinent abnormalities is identified by the interpreting radiologist, the patient will be contacted regarding those findings with instructions for follow-up management provided.   Fortunately he remains asymptomatic in regards to the right ureteral calculi. It has failed to pass with conservative medical expulsive therapy. I recommended proceeding with a definitive management strategy.   For ureteroscopy I described the risks which include heart attack, stroke, pulmonary embolus, death, bleeding, infection, damage to contiguous structures, positioning injury, ureteral stricture, ureteral avulsion, ureteral injury, need for ureteral stent, inability to perform ureteroscopy, need for an interval procedure,  inability to clear stone burden, stent discomfort and pain.   For shockwave lithotripsy I described the risks which include arrhythmia, kidney contusion, kidney hemorrhage, need for transfusion, long-term risk of diabetes or hypertension, back discomfort, flank ecchymosis, flank abrasion, inability to break up stone, inability to pass stone fragments, Steinstrasse, infection associated with obstructing stones, need for different surgical procedure and possible need for repeat shockwave lithotripsy.    Patient would like to proceed with shockwave lithotripsy. A green sheet will be filled out and submitted to scheduler today who will then contact the patient in the near future to set up next available right shockwave lithotripsy. If he becomes symptomatic with worsening pain/discomfort, new or worsening LUTS or has interval stone material passage he was given appropriate follow-up instructions and contacting clinic to arrange for sooner evaluation. All questions answered to the best of my  ability regarding the upcoming procedure and expected postoperative course with understanding expressed by the patient. He elects to proceed. He will stop by the lab prior to checking out to submit a urine specimen for preoperative culture/sensitivity. He will remain on tamsulosin .   Signed by Ubaldo Eagles, NP on 01/07/24 at 11:50 AM (EDT

## 2024-01-20 ENCOUNTER — Encounter (HOSPITAL_COMMUNITY): Payer: Self-pay | Admitting: Urology

## 2024-02-06 ENCOUNTER — Encounter: Payer: Self-pay | Admitting: Internal Medicine

## 2024-02-06 ENCOUNTER — Ambulatory Visit: Admitting: Internal Medicine

## 2024-02-06 VITALS — BP 136/78 | HR 64 | Temp 97.9°F | Resp 16 | Ht 71.0 in | Wt 172.0 lb

## 2024-02-06 DIAGNOSIS — I10 Essential (primary) hypertension: Secondary | ICD-10-CM

## 2024-02-06 DIAGNOSIS — Z23 Encounter for immunization: Secondary | ICD-10-CM

## 2024-02-06 DIAGNOSIS — E039 Hypothyroidism, unspecified: Secondary | ICD-10-CM | POA: Diagnosis not present

## 2024-02-06 DIAGNOSIS — E7849 Other hyperlipidemia: Secondary | ICD-10-CM

## 2024-02-06 DIAGNOSIS — N209 Urinary calculus, unspecified: Secondary | ICD-10-CM

## 2024-02-06 DIAGNOSIS — E038 Other specified hypothyroidism: Secondary | ICD-10-CM | POA: Diagnosis not present

## 2024-02-06 LAB — LIPID PANEL
Cholesterol: 146 mg/dL (ref 0–200)
HDL: 51.8 mg/dL (ref >39.00)
LDL Cholesterol: 69 mg/dL (ref 0–99)
NonHDL: 94.16
Total CHOL/HDL Ratio: 3
Triglycerides: 125 mg/dL (ref 0.0–149.0)
VLDL: 25 mg/dL (ref 0.0–40.0)

## 2024-02-06 LAB — TSH: TSH: 2.63 u[IU]/mL (ref 0.35–5.50)

## 2024-02-06 MED ORDER — TAMSULOSIN HCL 0.4 MG PO CAPS: 0.4000 mg | ORAL_CAPSULE | Freq: Every day | ORAL | 0 refills | Status: AC

## 2024-02-06 NOTE — Assessment & Plan Note (Signed)
 PLAN Urolithiasis: See LOV, urinalysis show RBCs, creatinine slightly up, referred to urology after CT showed mild R hydronephrosis, had a lithotripsy 01/19/2024.Follow-up creatinine essentially normalized. Currently having just some urinary frequency, was Rx tamsulosin  but is not taking, encouraged to do.  Prescription resent. HTN: BP looks good, continue metoprolol . High cholesterol: On simvastatin , last LFTs normal, check FLP Hypothyroidism: Synthroid , check TSH. Preventive care: Flu shot today.  Benefits of RSV, PNM 20 and COVID-vaccine discussed. RTC 4 months CPX

## 2024-02-06 NOTE — Progress Notes (Signed)
 Subjective:    Patient ID: Kevin [name], male    DOB: 13-Jul-1945, 78 y.o.   MRN: [id number]  DOS:  02/06/2024 Type of visit - description: Follow-up  Chronic medical problems addressed See last visit, was diagnosed with urolithiasis, had lithotripsy. Currently denies fever or chills, no blood in the stools.  Some urinary frequency.   Review of Systems See above   Past Medical History:  Diagnosis Date   COPD (chronic obstructive pulmonary disease) (HCC)    PFTs 01-2006 mild obstruction, (+) reponse to Bronchodilators     GERD (gastroesophageal reflux disease)    with certain foods   Hyperlipidemia    on meds   Hypertension    on meds   Seasonal allergies    Shingles    Thorax, 2008   Thyroid  disease    on meds    Past Surgical History:  Procedure Laterality Date   COLONOSCOPY  11/16/2008   Jakie   COLONOSCOPY  01/2019   HD-MAC-good prep-tics/TA x 8- 58yr recall   DENTAL SURGERY  2015   upper dentures   EXTRACORPOREAL SHOCK WAVE LITHOTRIPSY Right 01/19/2024   Procedure: LITHOTRIPSY, ESWL;  Surgeon: Carolee Sherwood JONETTA DOUGLAS, MD;  Location: [facility];  Service: Urology;  Laterality: Right;   POLYPECTOMY  2020   TA x 8    Current Outpatient Medications  Medication Instructions   acetaminophen  (TYLENOL ) 500 mg, Every 6 hours PRN   aspirin EC 81 mg, Daily   calcium carbonate (OS-CAL) 600 mg, Daily at bedtime   Cyanocobalamin  (VITAMIN B 12 PO) 1,000 mcg, 3 times weekly   dicyclomine  (BENTYL ) 10 mg, Oral, 3 times daily before meals & bedtime   HYDROcodone -acetaminophen  (NORCO/VICODIN) 5-325 MG tablet 1 tablet, Oral, Every 6 hours PRN, For acute post procedural pain ICD 10 G89.18   Krill Oil 500 MG CAPS 1 capsule, Daily   levothyroxine  (SYNTHROID ) 88 mcg, Oral, Daily before breakfast   metoprolol  succinate (TOPROL -XL) 50 mg, Oral, Daily   Multiple Vitamins-Minerals (MULTIVITAMIN PO) 1 tablet, Daily   ondansetron  (ZOFRAN -ODT) 8 MG disintegrating tablet 8mg  ODT q8 hours prn  nausea   oxymetazoline (AFRIN) 0.05 % nasal spray 2 sprays, Daily   simvastatin  (ZOCOR ) 40 mg, Oral, Daily-1800   tamsulosin  (FLOMAX ) 0.4 mg, Oral, Daily   Turmeric 500 mg, Daily   Zinc 50 mg, Daily       Objective:   Physical Exam BP 136/78   Pulse 64   Temp 97.9 F (36.6 C) (Oral)   Resp 16   Ht 5' 11 (1.803 m)   Wt 172 lb (78 kg)   SpO2 97%   BMI 23.99 kg/m  General:   Well developed, NAD, BMI noted. HEENT:  Normocephalic . Face symmetric, atraumatic Lungs:  CTA B Normal respiratory effort, no intercostal retractions, no accessory muscle use. Heart: RRR,  no murmur.  Lower extremities: no pretibial edema bilaterally  Skin: Not pale. Not jaundice Neurologic:  alert & oriented X3.  Speech normal, gait appropriate for age and unassisted Psych--  Cognition and judgment appear intact.  Cooperative with normal attention span and concentration.  Behavior appropriate. No anxious or depressed appearing.      Assessment   Assessment Hyperglycemia HTN Hypothyroidism Hyperlipidemia LUTS, urolithiasis 1st episode 02-2023 , 10/2022  COPD: PFTs 2007 mild obstruction, + response to bronchodilators, h/o multiple inhalers intolerance  Shingles 2008, chest. CAD per CT 2020 (continue aspirin) ETOH in the past  Skin Cancer    PLAN Urolithiasis: See LOV, urinalysis  show RBCs, creatinine slightly up, referred to urology after CT showed mild R hydronephrosis, had a lithotripsy 01/19/2024.Follow-up creatinine essentially normalized. Currently having just some urinary frequency, was Rx tamsulosin  but is not taking, encouraged to do.  Prescription resent. HTN: BP looks good, continue metoprolol . High cholesterol: On simvastatin , last LFTs normal, check FLP Hypothyroidism: Synthroid , check TSH. Preventive care: Flu shot today.  Benefits of RSV, PNM 20 and COVID-vaccine discussed. RTC 4 months CPX

## 2024-02-06 NOTE — Patient Instructions (Addendum)
 You are a flu shot today Please consider taking RSV, pneumonia shot (PNM 20) and a COVID booster  Start taking tamsulosin  daily to help with the urine symptoms  Check the  blood pressure regularly Blood pressure goal:  between 110/65 and  135/85. If it is consistently higher or lower, let me know     GO TO THE LAB :  Get the blood work   Your results will be posted on MyChart with my comments  Go to the front desk for the checkout Please make an appointment for a physical exam in 4 months    STOP BY THE FIRST FLOOR:  get the XR

## 2024-02-07 ENCOUNTER — Ambulatory Visit: Payer: Self-pay | Admitting: Internal Medicine

## 2024-02-09 DIAGNOSIS — N132 Hydronephrosis with renal and ureteral calculous obstruction: Secondary | ICD-10-CM | POA: Diagnosis not present

## 2024-02-21 ENCOUNTER — Other Ambulatory Visit: Payer: Self-pay | Admitting: Internal Medicine

## 2024-03-15 ENCOUNTER — Encounter: Payer: Self-pay | Admitting: Radiology

## 2024-05-12 ENCOUNTER — Other Ambulatory Visit: Payer: Self-pay

## 2024-05-20 ENCOUNTER — Other Ambulatory Visit: Payer: Self-pay | Admitting: Internal Medicine

## 2024-06-07 ENCOUNTER — Ambulatory Visit: Admitting: Internal Medicine

## 2024-11-30 ENCOUNTER — Ambulatory Visit
